# Patient Record
Sex: Male | Born: 1975
Health system: Southern US, Community
[De-identification: ages and names within clinical notes are randomized; demographics above are authoritative.]

## PROBLEM LIST (undated history)

## (undated) DIAGNOSIS — F1910 Other psychoactive substance abuse, uncomplicated: Secondary | ICD-10-CM

## (undated) DIAGNOSIS — N182 Chronic kidney disease, stage 2 (mild): Secondary | ICD-10-CM

## (undated) DIAGNOSIS — I428 Other cardiomyopathies: Secondary | ICD-10-CM

## (undated) DIAGNOSIS — E119 Type 2 diabetes mellitus without complications: Secondary | ICD-10-CM

## (undated) HISTORY — PX: DENTAL SURGERY: SHX609

## (undated) HISTORY — DX: Other cardiomyopathies: I42.8

---

## 1999-05-11 ENCOUNTER — Inpatient Hospital Stay (HOSPITAL_COMMUNITY): Admission: EM | Admit: 1999-05-11 | Discharge: 1999-05-14 | Payer: Self-pay | Admitting: Emergency Medicine

## 1999-05-19 ENCOUNTER — Encounter: Admission: RE | Admit: 1999-05-19 | Discharge: 1999-08-17 | Payer: Self-pay | Admitting: Family Medicine

## 1999-08-22 ENCOUNTER — Encounter: Admission: RE | Admit: 1999-08-22 | Discharge: 1999-11-20 | Payer: Self-pay | Admitting: Family Medicine

## 2000-02-09 ENCOUNTER — Emergency Department (HOSPITAL_COMMUNITY): Admission: EM | Admit: 2000-02-09 | Discharge: 2000-02-09 | Payer: Self-pay | Admitting: Emergency Medicine

## 2000-09-16 ENCOUNTER — Emergency Department (HOSPITAL_COMMUNITY): Admission: EM | Admit: 2000-09-16 | Discharge: 2000-09-16 | Payer: Self-pay | Admitting: Emergency Medicine

## 2001-04-08 ENCOUNTER — Emergency Department (HOSPITAL_COMMUNITY): Admission: EM | Admit: 2001-04-08 | Discharge: 2001-04-08 | Payer: Self-pay | Admitting: Emergency Medicine

## 2003-12-24 ENCOUNTER — Ambulatory Visit: Payer: Self-pay | Admitting: *Deleted

## 2003-12-24 ENCOUNTER — Ambulatory Visit: Payer: Self-pay | Admitting: Internal Medicine

## 2004-06-11 ENCOUNTER — Inpatient Hospital Stay (HOSPITAL_COMMUNITY): Admission: EM | Admit: 2004-06-11 | Discharge: 2004-06-13 | Payer: Self-pay | Admitting: Emergency Medicine

## 2004-06-29 ENCOUNTER — Inpatient Hospital Stay (HOSPITAL_COMMUNITY): Admission: EM | Admit: 2004-06-29 | Discharge: 2004-06-30 | Payer: Self-pay | Admitting: Emergency Medicine

## 2004-07-06 ENCOUNTER — Ambulatory Visit: Payer: Self-pay | Admitting: Internal Medicine

## 2004-11-23 ENCOUNTER — Emergency Department (HOSPITAL_COMMUNITY): Admission: EM | Admit: 2004-11-23 | Discharge: 2004-11-23 | Payer: Self-pay | Admitting: Emergency Medicine

## 2005-05-06 ENCOUNTER — Inpatient Hospital Stay (HOSPITAL_COMMUNITY): Admission: EM | Admit: 2005-05-06 | Discharge: 2005-05-07 | Payer: Self-pay | Admitting: Emergency Medicine

## 2005-08-09 ENCOUNTER — Emergency Department (HOSPITAL_COMMUNITY): Admission: EM | Admit: 2005-08-09 | Discharge: 2005-08-10 | Payer: Self-pay | Admitting: Emergency Medicine

## 2005-08-11 ENCOUNTER — Ambulatory Visit: Payer: Self-pay | Admitting: Internal Medicine

## 2005-09-28 ENCOUNTER — Inpatient Hospital Stay (HOSPITAL_COMMUNITY): Admission: EM | Admit: 2005-09-28 | Discharge: 2005-09-30 | Payer: Self-pay | Admitting: Emergency Medicine

## 2005-09-28 ENCOUNTER — Ambulatory Visit: Payer: Self-pay | Admitting: Internal Medicine

## 2005-10-02 ENCOUNTER — Emergency Department (HOSPITAL_COMMUNITY): Admission: EM | Admit: 2005-10-02 | Discharge: 2005-10-02 | Payer: Self-pay | Admitting: Emergency Medicine

## 2006-08-29 ENCOUNTER — Ambulatory Visit: Payer: Self-pay | Admitting: Internal Medicine

## 2007-01-02 ENCOUNTER — Encounter (INDEPENDENT_AMBULATORY_CARE_PROVIDER_SITE_OTHER): Payer: Self-pay | Admitting: *Deleted

## 2007-03-05 ENCOUNTER — Ambulatory Visit: Payer: Self-pay | Admitting: Internal Medicine

## 2007-11-22 ENCOUNTER — Emergency Department (HOSPITAL_COMMUNITY): Admission: EM | Admit: 2007-11-22 | Discharge: 2007-11-22 | Payer: Self-pay | Admitting: Emergency Medicine

## 2008-01-28 ENCOUNTER — Ambulatory Visit: Payer: Self-pay | Admitting: Internal Medicine

## 2008-07-31 ENCOUNTER — Emergency Department (HOSPITAL_COMMUNITY): Admission: EM | Admit: 2008-07-31 | Discharge: 2008-07-31 | Payer: Self-pay | Admitting: Emergency Medicine

## 2008-09-21 ENCOUNTER — Inpatient Hospital Stay (HOSPITAL_COMMUNITY): Admission: EM | Admit: 2008-09-21 | Discharge: 2008-09-23 | Payer: Self-pay | Admitting: Emergency Medicine

## 2009-06-06 ENCOUNTER — Inpatient Hospital Stay (HOSPITAL_COMMUNITY): Admission: EM | Admit: 2009-06-06 | Discharge: 2009-06-08 | Payer: Self-pay | Admitting: Emergency Medicine

## 2009-06-17 ENCOUNTER — Ambulatory Visit: Payer: Self-pay | Admitting: Family Medicine

## 2009-06-17 ENCOUNTER — Encounter (INDEPENDENT_AMBULATORY_CARE_PROVIDER_SITE_OTHER): Payer: Self-pay | Admitting: Internal Medicine

## 2009-06-17 LAB — CONVERTED CEMR LAB
CO2: 26 meq/L (ref 19–32)
Calcium: 8.7 mg/dL (ref 8.4–10.5)
Chloride: 104 meq/L (ref 96–112)
Glucose, Bld: 134 mg/dL — ABNORMAL HIGH (ref 70–99)
Sodium: 139 meq/L (ref 135–145)

## 2010-06-21 ENCOUNTER — Emergency Department (HOSPITAL_COMMUNITY)
Admission: EM | Admit: 2010-06-21 | Discharge: 2010-06-21 | Disposition: A | Discharge status: Home / Self Care | Payer: Self-pay | Attending: Emergency Medicine | Admitting: Emergency Medicine

## 2010-06-21 DIAGNOSIS — E119 Type 2 diabetes mellitus without complications: Secondary | ICD-10-CM | POA: Insufficient documentation

## 2010-06-21 DIAGNOSIS — E860 Dehydration: Secondary | ICD-10-CM | POA: Insufficient documentation

## 2010-06-21 DIAGNOSIS — Z794 Long term (current) use of insulin: Secondary | ICD-10-CM | POA: Insufficient documentation

## 2010-06-21 LAB — COMPREHENSIVE METABOLIC PANEL
ALT: 20 U/L (ref 0–53)
AST: 21 U/L (ref 0–37)
CO2: 27 mEq/L (ref 19–32)
Calcium: 9.4 mg/dL (ref 8.4–10.5)
Chloride: 93 mEq/L — ABNORMAL LOW (ref 96–112)
Creatinine, Ser: 1.05 mg/dL (ref 0.4–1.5)
GFR calc Af Amer: >60 mL/min (ref >60)
GFR calc non Af Amer: >60 mL/min (ref >60)
Glucose, Bld: 541 mg/dL — ABNORMAL HIGH (ref 70–99)
Total Bilirubin: 0.6 mg/dL (ref 0.3–1.2)

## 2010-06-21 LAB — URINALYSIS, ROUTINE W REFLEX MICROSCOPIC
Bilirubin Urine: NEGATIVE
Hgb urine dipstick: NEGATIVE
Nitrite: NEGATIVE
Protein, ur: NEGATIVE mg/dL
Specific Gravity, Urine: 1.026 (ref 1.005–1.030)
Urobilinogen, UA: 0.2 mg/dL (ref 0.0–1.0)

## 2010-06-21 LAB — CBC
HCT: 44.4 % (ref 39.0–52.0)
Hemoglobin: 15.8 g/dL (ref 13.0–17.0)
MCH: 32.2 pg (ref 26.0–34.0)
MCHC: 35.6 g/dL (ref 30.0–36.0)
RBC: 4.91 MIL/uL (ref 4.22–5.81)

## 2010-06-21 LAB — POCT I-STAT, CHEM 8
BUN: 14 mg/dL (ref 6–23)
Chloride: 97 mEq/L (ref 96–112)
Creatinine, Ser: 1 mg/dL (ref 0.4–1.5)
Potassium: 5.1 mEq/L (ref 3.5–5.1)
Sodium: 131 mEq/L — ABNORMAL LOW (ref 135–145)
TCO2: 25 mmol/L (ref 0–100)

## 2010-06-21 LAB — DIFFERENTIAL
Basophils Relative: 0 % (ref 0–1)
Lymphocytes Relative: 36 % (ref 12–46)
Lymphs Abs: 3.2 10*3/uL (ref 0.7–4.0)
Monocytes Absolute: 0.6 10*3/uL (ref 0.1–1.0)
Monocytes Relative: 6 % (ref 3–12)
Neutro Abs: 5.2 10*3/uL (ref 1.7–7.7)
Neutrophils Relative %: 57 % (ref 43–77)

## 2010-06-21 LAB — GLUCOSE, CAPILLARY
Glucose-Capillary: >600 mg/dL (ref 70–99)
Glucose-Capillary: 149 mg/dL — ABNORMAL HIGH (ref 70–99)

## 2010-06-21 LAB — LIPASE, BLOOD: Lipase: 44 U/L (ref 11–59)

## 2010-07-08 LAB — BASIC METABOLIC PANEL
BUN: 15 mg/dL (ref 6–23)
BUN: 4 mg/dL — ABNORMAL LOW (ref 6–23)
BUN: 6 mg/dL (ref 6–23)
CO2: 18 mEq/L — ABNORMAL LOW (ref 19–32)
CO2: 5 mEq/L — CL (ref 19–32)
Calcium: 7.3 mg/dL — ABNORMAL LOW (ref 8.4–10.5)
Calcium: 7.9 mg/dL — ABNORMAL LOW (ref 8.4–10.5)
Calcium: 8.2 mg/dL — ABNORMAL LOW (ref 8.4–10.5)
Calcium: 9.2 mg/dL (ref 8.4–10.5)
Chloride: 113 mEq/L — ABNORMAL HIGH (ref 96–112)
Chloride: 113 mEq/L — ABNORMAL HIGH (ref 96–112)
Chloride: 93 mEq/L — ABNORMAL LOW (ref 96–112)
Creatinine, Ser: 0.78 mg/dL (ref 0.4–1.5)
Creatinine, Ser: 1.23 mg/dL (ref 0.4–1.5)
Creatinine, Ser: 1.71 mg/dL — ABNORMAL HIGH (ref 0.4–1.5)
Creatinine, Ser: 1.81 mg/dL — ABNORMAL HIGH (ref 0.4–1.5)
Creatinine, Ser: 1.86 mg/dL — ABNORMAL HIGH (ref 0.4–1.5)
GFR calc Af Amer: 50 mL/min — ABNORMAL LOW (ref >60)
GFR calc Af Amer: 52 mL/min — ABNORMAL LOW (ref >60)
GFR calc Af Amer: >60 mL/min (ref >60)
GFR calc Af Amer: >60 mL/min (ref >60)
GFR calc Af Amer: >60 mL/min (ref >60)
GFR calc non Af Amer: 43 mL/min — ABNORMAL LOW (ref >60)
GFR calc non Af Amer: 51 mL/min — ABNORMAL LOW (ref >60)
GFR calc non Af Amer: >60 mL/min (ref >60)
GFR calc non Af Amer: >60 mL/min (ref >60)
GFR calc non Af Amer: >60 mL/min (ref >60)
Glucose, Bld: 110 mg/dL — ABNORMAL HIGH (ref 70–99)
Glucose, Bld: 151 mg/dL — ABNORMAL HIGH (ref 70–99)
Glucose, Bld: 177 mg/dL — ABNORMAL HIGH (ref 70–99)
Potassium: 3.5 mEq/L (ref 3.5–5.1)
Potassium: 3.7 mEq/L (ref 3.5–5.1)
Potassium: 3.9 mEq/L (ref 3.5–5.1)
Sodium: 135 mEq/L (ref 135–145)
Sodium: 137 mEq/L (ref 135–145)
Sodium: 137 mEq/L (ref 135–145)
Sodium: 138 mEq/L (ref 135–145)
Sodium: 138 mEq/L (ref 135–145)

## 2010-07-08 LAB — CBC
Hemoglobin: 12.6 g/dL — ABNORMAL LOW (ref 13.0–17.0)
MCV: 94.1 fL (ref 78.0–100.0)
MCV: 94.6 fL (ref 78.0–100.0)
Platelets: 336 10*3/uL (ref 150–400)
Platelets: 363 10*3/uL (ref 150–400)
RBC: 3.8 MIL/uL — ABNORMAL LOW (ref 4.22–5.81)
RBC: 3.82 MIL/uL — ABNORMAL LOW (ref 4.22–5.81)
WBC: 15.4 10*3/uL — ABNORMAL HIGH (ref 4.0–10.5)
WBC: 17.3 10*3/uL — ABNORMAL HIGH (ref 4.0–10.5)
WBC: 8.3 10*3/uL (ref 4.0–10.5)

## 2010-07-08 LAB — GLUCOSE, CAPILLARY
Glucose-Capillary: 104 mg/dL — ABNORMAL HIGH (ref 70–99)
Glucose-Capillary: 137 mg/dL — ABNORMAL HIGH (ref 70–99)
Glucose-Capillary: 138 mg/dL — ABNORMAL HIGH (ref 70–99)
Glucose-Capillary: 145 mg/dL — ABNORMAL HIGH (ref 70–99)
Glucose-Capillary: 154 mg/dL — ABNORMAL HIGH (ref 70–99)
Glucose-Capillary: 163 mg/dL — ABNORMAL HIGH (ref 70–99)
Glucose-Capillary: 197 mg/dL — ABNORMAL HIGH (ref 70–99)
Glucose-Capillary: 208 mg/dL — ABNORMAL HIGH (ref 70–99)
Glucose-Capillary: 283 mg/dL — ABNORMAL HIGH (ref 70–99)
Glucose-Capillary: 317 mg/dL — ABNORMAL HIGH (ref 70–99)
Glucose-Capillary: 333 mg/dL — ABNORMAL HIGH (ref 70–99)
Glucose-Capillary: 378 mg/dL — ABNORMAL HIGH (ref 70–99)
Glucose-Capillary: 462 mg/dL — ABNORMAL HIGH (ref 70–99)
Glucose-Capillary: 83 mg/dL (ref 70–99)
Glucose-Capillary: 97 mg/dL (ref 70–99)

## 2010-07-08 LAB — CK TOTAL AND CKMB (NOT AT ARMC)
CK, MB: 1.8 ng/mL (ref 0.3–4.0)
Total CK: 132 U/L (ref 7–232)

## 2010-07-08 LAB — DIFFERENTIAL
Basophils Absolute: 0 10*3/uL (ref 0.0–0.1)
Basophils Relative: 0 % (ref 0–1)
Basophils Relative: 1 % (ref 0–1)
Eosinophils Absolute: 0 10*3/uL (ref 0.0–0.7)
Eosinophils Absolute: 0 10*3/uL (ref 0.0–0.7)
Eosinophils Relative: 0 % (ref 0–5)
Lymphocytes Relative: 16 % (ref 12–46)
Lymphocytes Relative: 29 % (ref 12–46)
Lymphs Abs: 2.2 10*3/uL (ref 0.7–4.0)
Lymphs Abs: 2.4 10*3/uL (ref 0.7–4.0)
Monocytes Absolute: 0.9 10*3/uL (ref 0.1–1.0)
Monocytes Relative: 12 % (ref 3–12)
Neutro Abs: 11.9 10*3/uL — ABNORMAL HIGH (ref 1.7–7.7)
Neutro Abs: 4.9 10*3/uL (ref 1.7–7.7)
Neutrophils Relative %: 59 % (ref 43–77)
Neutrophils Relative %: 77 % (ref 43–77)

## 2010-07-08 LAB — CARDIAC PANEL(CRET KIN+CKTOT+MB+TROPI)
CK, MB: 1.7 ng/mL (ref 0.3–4.0)
CK, MB: 1.8 ng/mL (ref 0.3–4.0)
Relative Index: 1.3 (ref 0.0–2.5)
Relative Index: 1.4 (ref 0.0–2.5)
Total CK: 130 U/L (ref 7–232)
Troponin I: 0.02 ng/mL (ref 0.00–0.06)

## 2010-07-08 LAB — COMPREHENSIVE METABOLIC PANEL
ALT: 10 U/L (ref 0–53)
AST: 13 U/L (ref 0–37)
Albumin: 2.6 g/dL — ABNORMAL LOW (ref 3.5–5.2)
Alkaline Phosphatase: 81 U/L (ref 39–117)
CO2: 19 mEq/L (ref 19–32)
Chloride: 111 mEq/L (ref 96–112)
Creatinine, Ser: 1.17 mg/dL (ref 0.4–1.5)
GFR calc Af Amer: >60 mL/min (ref >60)
GFR calc non Af Amer: >60 mL/min (ref >60)
Potassium: 3.5 mEq/L (ref 3.5–5.1)
Sodium: 134 mEq/L — ABNORMAL LOW (ref 135–145)
Total Bilirubin: 0.5 mg/dL (ref 0.3–1.2)

## 2010-07-08 LAB — CULTURE, BLOOD (ROUTINE X 2)

## 2010-07-08 LAB — URINALYSIS, ROUTINE W REFLEX MICROSCOPIC
Glucose, UA: NEGATIVE mg/dL
Nitrite: NEGATIVE
Specific Gravity, Urine: 1.016 (ref 1.005–1.030)
pH: 5.5 (ref 5.0–8.0)

## 2010-07-08 LAB — BLOOD GAS, ARTERIAL
Drawn by: 308601
FIO2: 0.21 %
pCO2 arterial: 16.7 mmHg — CL (ref 35.0–45.0)
pH, Arterial: 7.156 — CL (ref 7.350–7.450)

## 2010-07-08 LAB — URINE CULTURE
Colony Count: NO GROWTH
Culture: NO GROWTH

## 2010-07-08 LAB — KETONES, QUALITATIVE

## 2010-07-08 LAB — URINE MICROSCOPIC-ADD ON: Urine-Other: NONE SEEN

## 2010-07-08 LAB — RAPID URINE DRUG SCREEN, HOSP PERFORMED
Barbiturates: NOT DETECTED
Opiates: NOT DETECTED

## 2010-07-25 LAB — BASIC METABOLIC PANEL
BUN: 11 mg/dL (ref 6–23)
BUN: 4 mg/dL — ABNORMAL LOW (ref 6–23)
CO2: 15 mEq/L — ABNORMAL LOW (ref 19–32)
CO2: 20 mEq/L (ref 19–32)
CO2: 22 mEq/L (ref 19–32)
Calcium: 7.7 mg/dL — ABNORMAL LOW (ref 8.4–10.5)
Calcium: 7.9 mg/dL — ABNORMAL LOW (ref 8.4–10.5)
Calcium: 8.1 mg/dL — ABNORMAL LOW (ref 8.4–10.5)
Chloride: 106 mEq/L (ref 96–112)
Chloride: 96 mEq/L (ref 96–112)
Creatinine, Ser: 0.97 mg/dL (ref 0.4–1.5)
Creatinine, Ser: 1.99 mg/dL — ABNORMAL HIGH (ref 0.4–1.5)
GFR calc Af Amer: 47 mL/min — ABNORMAL LOW (ref >60)
GFR calc non Af Amer: 48 mL/min — ABNORMAL LOW (ref >60)
GFR calc non Af Amer: >60 mL/min (ref >60)
Glucose, Bld: 140 mg/dL — ABNORMAL HIGH (ref 70–99)
Glucose, Bld: 177 mg/dL — ABNORMAL HIGH (ref 70–99)
Glucose, Bld: 280 mg/dL — ABNORMAL HIGH (ref 70–99)
Glucose, Bld: 328 mg/dL — ABNORMAL HIGH (ref 70–99)
Potassium: 3.8 mEq/L (ref 3.5–5.1)
Potassium: 4 mEq/L (ref 3.5–5.1)
Potassium: 4.4 mEq/L (ref 3.5–5.1)
Potassium: 4.8 mEq/L (ref 3.5–5.1)
Sodium: 136 mEq/L (ref 135–145)
Sodium: 139 mEq/L (ref 135–145)
Sodium: 140 mEq/L (ref 135–145)
Sodium: 141 mEq/L (ref 135–145)

## 2010-07-25 LAB — CARDIAC PANEL(CRET KIN+CKTOT+MB+TROPI)
Relative Index: 1.5 (ref 0.0–2.5)
Troponin I: 0.03 ng/mL (ref 0.00–0.06)

## 2010-07-25 LAB — GLUCOSE, CAPILLARY
Glucose-Capillary: 114 mg/dL — ABNORMAL HIGH (ref 70–99)
Glucose-Capillary: 131 mg/dL — ABNORMAL HIGH (ref 70–99)
Glucose-Capillary: 136 mg/dL — ABNORMAL HIGH (ref 70–99)
Glucose-Capillary: 163 mg/dL — ABNORMAL HIGH (ref 70–99)
Glucose-Capillary: 166 mg/dL — ABNORMAL HIGH (ref 70–99)
Glucose-Capillary: 172 mg/dL — ABNORMAL HIGH (ref 70–99)
Glucose-Capillary: 180 mg/dL — ABNORMAL HIGH (ref 70–99)
Glucose-Capillary: 210 mg/dL — ABNORMAL HIGH (ref 70–99)
Glucose-Capillary: 365 mg/dL — ABNORMAL HIGH (ref 70–99)
Glucose-Capillary: 54 mg/dL — ABNORMAL LOW (ref 70–99)

## 2010-07-25 LAB — DIFFERENTIAL
Basophils Absolute: 0 10*3/uL (ref 0.0–0.1)
Basophils Absolute: 0.1 10*3/uL (ref 0.0–0.1)
Eosinophils Absolute: 0 10*3/uL (ref 0.0–0.7)
Eosinophils Absolute: 0 10*3/uL (ref 0.0–0.7)
Eosinophils Relative: 0 % (ref 0–5)
Lymphocytes Relative: 14 % (ref 12–46)
Lymphocytes Relative: 40 % (ref 12–46)
Lymphs Abs: 2.5 10*3/uL (ref 0.7–4.0)
Lymphs Abs: 3.9 10*3/uL (ref 0.7–4.0)
Monocytes Absolute: 1.7 10*3/uL — ABNORMAL HIGH (ref 0.1–1.0)
Monocytes Relative: 5 % (ref 3–12)
Neutrophils Relative %: 53 % (ref 43–77)
Neutrophils Relative %: 79 % — ABNORMAL HIGH (ref 43–77)

## 2010-07-25 LAB — URINALYSIS, ROUTINE W REFLEX MICROSCOPIC
Bilirubin Urine: NEGATIVE
Glucose, UA: >1000 mg/dL — AB
Hgb urine dipstick: NEGATIVE
Ketones, ur: >80 mg/dL — AB
Protein, ur: NEGATIVE mg/dL

## 2010-07-25 LAB — HEPATIC FUNCTION PANEL
AST: 27 U/L (ref 0–37)
Albumin: 4.3 g/dL (ref 3.5–5.2)
Alkaline Phosphatase: 115 U/L (ref 39–117)
Total Bilirubin: 2 mg/dL — ABNORMAL HIGH (ref 0.3–1.2)

## 2010-07-25 LAB — CBC
HCT: 35.9 % — ABNORMAL LOW (ref 39.0–52.0)
Hemoglobin: 12.9 g/dL — ABNORMAL LOW (ref 13.0–17.0)
MCV: 94.2 fL (ref 78.0–100.0)
MCV: 95.7 fL (ref 78.0–100.0)
Platelets: 208 10*3/uL (ref 150–400)
Platelets: 230 10*3/uL (ref 150–400)
RBC: 4.9 MIL/uL (ref 4.22–5.81)
RDW: 13.2 % (ref 11.5–15.5)
RDW: 13.5 % (ref 11.5–15.5)
WBC: 16.3 10*3/uL — ABNORMAL HIGH (ref 4.0–10.5)
WBC: 9.9 10*3/uL (ref 4.0–10.5)

## 2010-07-25 LAB — CALCIUM: Calcium: 7.9 mg/dL — ABNORMAL LOW (ref 8.4–10.5)

## 2010-07-25 LAB — CK TOTAL AND CKMB (NOT AT ARMC)
Relative Index: 1 (ref 0.0–2.5)
Total CK: 139 U/L (ref 7–232)

## 2010-07-25 LAB — TROPONIN I: Troponin I: 0.02 ng/mL (ref 0.00–0.06)

## 2010-07-27 LAB — CBC
Hemoglobin: 15.7 g/dL (ref 13.0–17.0)
MCHC: 34.7 g/dL (ref 30.0–36.0)
RBC: 4.76 MIL/uL (ref 4.22–5.81)
WBC: 9.8 10*3/uL (ref 4.0–10.5)

## 2010-07-27 LAB — URINALYSIS, ROUTINE W REFLEX MICROSCOPIC
Bilirubin Urine: NEGATIVE
Glucose, UA: >1000 mg/dL — AB
Hgb urine dipstick: NEGATIVE
Specific Gravity, Urine: 1.03 (ref 1.005–1.030)
Urobilinogen, UA: 0.2 mg/dL (ref 0.0–1.0)
pH: 5.5 (ref 5.0–8.0)

## 2010-07-27 LAB — BASIC METABOLIC PANEL
CO2: 16 mEq/L — ABNORMAL LOW (ref 19–32)
Calcium: 9.4 mg/dL (ref 8.4–10.5)
Creatinine, Ser: 1.46 mg/dL (ref 0.4–1.5)
GFR calc Af Amer: >60 mL/min (ref >60)
Sodium: 133 mEq/L — ABNORMAL LOW (ref 135–145)

## 2010-07-27 LAB — DIFFERENTIAL
Basophils Relative: 0 % (ref 0–1)
Lymphocytes Relative: 23 % (ref 12–46)
Lymphs Abs: 2.2 10*3/uL (ref 0.7–4.0)
Monocytes Absolute: 0.6 10*3/uL (ref 0.1–1.0)
Monocytes Relative: 6 % (ref 3–12)
Neutro Abs: 6.8 10*3/uL (ref 1.7–7.7)
Neutrophils Relative %: 70 % (ref 43–77)

## 2010-07-27 LAB — GLUCOSE, CAPILLARY
Glucose-Capillary: 31 mg/dL — CL (ref 70–99)
Glucose-Capillary: 312 mg/dL — ABNORMAL HIGH (ref 70–99)
Glucose-Capillary: 340 mg/dL — ABNORMAL HIGH (ref 70–99)
Glucose-Capillary: 358 mg/dL — ABNORMAL HIGH (ref 70–99)
Glucose-Capillary: 445 mg/dL — ABNORMAL HIGH (ref 70–99)

## 2010-07-27 LAB — URINE MICROSCOPIC-ADD ON: Urine-Other: NONE SEEN

## 2010-08-30 NOTE — Discharge Summary (Signed)
[name], [name]             ACCOUNT NO.:  [id number]   MEDICAL RECORD NO.:  [id number]          PATIENT TYPE:  INP   LOCATION:  1319                         FACILITY:  [facility]   PHYSICIAN:  Peggye Pitt, M.D. DATE OF BIRTH:  [birth year]   DATE OF ADMISSION:  09/21/2008  DATE OF DISCHARGE:  09/23/2008                               DISCHARGE SUMMARY   DISCHARGE DIAGNOSES:  1. Nausea, vomiting, resolved.  2. Diabetic ketoacidosis, resolved.  3. Type 1 diabetes mellitus.  4. Acute renal insufficiency, resolved.   DISCHARGE MEDICATIONS:  1. Lantus 30 units subcutaneously daily.  2. NovoLog sliding scale insulin as follows.  His blood sugar between      121 and 150 to inject 1 unit, if between 151 and 200 two units, if      between 201 and 250 three units, if between 251 and 300 five units,      if between 301 and 350 seven units, and if greater or equal to 351      inject 9 units.   DISPOSITION AND FOLLOW-UP:  [name] is discharged home today in  stable condition.  I have asked the case manager to assist me with both  his Lantus as well as follow-up with [facility].  He is instructed to  keep a log book, and to bring that in at time of his hospital follow-up  appointment.   CONSULTATIONS THIS HOSPITALIZATION:  None.   IMAGES AND PROCEDURES:  A chest x-ray on September 21, 2008 that showed no  acute findings.   History and physical exam, for full details please see dictation by Dr.  Lovell Sheehan on September 22, 2008, but in brief [name] is a pleasant 35-year-  old African American man with a history of type 1 diabetes mellitus who  came in with unrelenting nausea and vomiting for the past week, unable  to hold down any food or liquids.  Also reports that blood sugars have  been going up.  He has been out of his medications, as he has not been  able to afford them.  He was found to have a blood sugar of 618 and a  bicarb of 16 upon admission, and hence we were called to admit him  for  further evaluation and management.   HOSPITAL COURSE BY ACTIVE PROBLEM:  1. Diabetic ketoacidosis.  Initially admitted to the step-down unit,      placed on IV insulin for a Glucommander protocol, aggressive IV      fluid resuscitation.  He has now been transitioned over to      subcutaneous insulin, and is doing well with CBGs in the 130s to      160s on Lantus and sliding-scale insulin.  2. Acute renal insufficiency presumed secondary to dehydration      secondary to his diabetic ketoacidosis.  His creatinine upon      admission was 1.99.  It has now normalized to 0.97 on day of      discharge.   VITAL SIGNS ON DAY OF DISCHARGE:  Blood pressure 116/82, heart rate 87,  respirations 20, O2 saturations  100% on room air with a temp 98.1.   LABORATORIES:  On day of discharge sodium 38, potassium 3.6, chloride  107, bicarb 26, BUN 4, creatinine 0.97, glucose of 177.  WBC 9.9,  hemoglobin 12.7, and a platelet count of 208.      Peggye Pitt, M.D.  Electronically Signed     EH/MEDQ  D:  09/23/2008  T:  09/23/2008  Job:  [id number]

## 2010-08-30 NOTE — H&P (Signed)
NAMEKEM, HENSEN NO.:  [id number]   MEDICAL RECORD NO.:  [id number]          PATIENT TYPE:  INP   LOCATION:  1232                         FACILITY:  [facility]   PHYSICIAN:  Della Goo, M.D. DATE OF BIRTH:  [birth year]   DATE OF ADMISSION:  09/21/2008  DATE OF DISCHARGE:                              HISTORY & PHYSICAL   PRIMARY CARE PHYSICIAN:  Unassigned.   CHIEF COMPLAINT:  Nausea, vomiting.   HISTORY OF PRESENT ILLNESS:  This is a 35 year old male who presents to  the emergency department with complaints of severe nausea and vomiting  over the past week.  He reports not being able to hold down any foods or  liquids.  He also reports that his blood sugars have been going up.  He  states that he has been out of his medications for the past week as  well.  He denies having any cough, fevers, chills.  Denies having any  diarrhea.  When the patient arrived in the emergency department, his  blood sugar was checked and he was found to have a glucose of 618.  The  patient was referred for admission.   PAST MEDICAL HISTORY:  Type 1 diabetes mellitus.   PAST SURGICAL HISTORY:  None.   MEDICATIONS:  The patient previously had been on Lantus 30 units subcu  q.h.s. with sliding scale insulin coverage.   ALLERGIES:  SHELLFISH.   SOCIAL HISTORY:  The patient is a smoker.  He smokes 5-6 cigarettes  daily.  He is a nondrinker.  He denies any illicit drug usage.   FAMILY HISTORY:  Noncontributory.   REVIEW OF SYSTEMS:  Pertinents are mentioned above.   PHYSICAL EXAMINATION FINDINGS:  GENERAL:  This is a thin, well-  developed, 35 year old male in mild discomfort and no acute distress.  VITAL SIGNS:  Temperature 98.2, blood pressure 104/55, heart rate 120,  respirations 20, O2 sats 100%.  HEENT:  Normocephalic, atraumatic.  Pupils equally round, reactive to  light.  Extraocular movements are intact.  Funduscopic benign.  Nares  are patent bilaterally.   Oropharynx is clear.  NECK:  Supple.  Full range of motion.  No thyromegaly, adenopathy,  jugular venous distention.  CARDIOVASCULAR:  Tachycardiac rate and rhythm.  No murmurs, gallops or  rubs.  LUNGS:  Clear to auscultation bilaterally.  ABDOMEN:  Positive bowel sounds, soft, nontender, nondistended.  EXTREMITIES:  Without cyanosis, clubbing or edema.  NEUROLOGIC:  The patient is alert and oriented x3.  There are no focal  deficits.   LABORATORY STUDIES:  White blood cell count 16.3, hemoglobin 16.4,  hematocrit 46.9, platelets 275, neutrophils 79%, lymphocytes 15%,  albumin 4.3, AST 27, ALT 27, alkaline phosphatase 115, total bilirubin  2.0.  Sodium 139, potassium 4.8, chloride 96, carbon dioxide 16, BUN 19,  creatinine 1.99 and glucose 618.  Urinalysis reveals greater than 1000  glucose.  Urine ketones 80.  Chest x-ray reveals no acute disease  process.   ASSESSMENT:  A 35 year old male being admitted with:  1. Diabetic ketoacidosis.  2. Nausea and vomiting.  3. Mild dehydration.  4. Tobacco abuse.  PLAN:  The patient will be admitted to the step-down ICU area.  He will  be placed on the IV insulin protocol and IV fluids have been ordered.  His electrolytes will be monitored and replaced as needed..  The patient  will be placed on DVT and GI prophylaxis.  Antiemetic therapy has also  been ordered as needed.  The patient will be transitioned to sliding  scale insulin coverage and then resume his Lantus insulin.  Further  workup will ensue pending results of the patient's clinical, course.      Della Goo, M.D.  Electronically Signed     HJ/MEDQ  D:  09/22/2008  T:  09/22/2008  Job:  [id number]

## 2010-09-02 NOTE — H&P (Signed)
[name], [name] NO.:  [id number]   MEDICAL RECORD NO.:  [id number]          PATIENT TYPE:  EMS   LOCATION:  MAJO                         FACILITY:  [facility]   PHYSICIAN:  Renato Battles, M.D.     DATE OF BIRTH:  March 13, 1976   DATE OF ADMISSION:  06/10/2004  DATE OF DISCHARGE:                                HISTORY & PHYSICAL   REASON FOR ADMISSION:  Feeling bad.   PRIMARY CARE PHYSICIAN:  [facility].   HISTORY OF PRESENT ILLNESS:  The patient is a 35 year old African American  male who was feeling bad for the last couple of days. He admitted being  short on insulin and not taking it as regularly as he is supposed to. He  also told me that he was exposed to his children who have the flu. He felt  progressively weak and tired and decided to come to the emergency room where  initial workup showed that he developed DKA.   REVIEW OF SYSTEMS:  CONSTITUTIONAL SYMPTOMS: No fever, chills, or  nightsweats. CARDIOPULMONARY: Positive for cough. No shortness of breath. No  chest pain. Also positive for right-sided chest pain that is worse with  swallowing, coughing, and with pressure. GI: Positive for nausea but no  vomiting or diarrhea. GU: No dysuria, hematuria, or retention.   PAST MEDICAL HISTORY:  1.  Type 1 diabetes since 2001.  2.  Questionable history of hypertension.   PAST SURGICAL HISTORY:  None.   FAMILY HISTORY:  Positive for diabetes.   SOCIAL HISTORY:  He smoked one pack a day for the last 13 years. He denies  alcohol. He uses marijuana with very recent use and admits to occasional  cocaine use.   ALLERGIES:  Shellfish.   HOME MEDICATIONS:  1.  Insulin 70/30 subcutaneously, 30 units q.a.m. and 30 units q.p.m.  2.  Heart pill. The patient does not know what it does, and thinks it is      probably related to his kidney and blood pressure. Sounds like an ACE      inhibitor.   PHYSICAL EXAMINATION:  GENERAL: The patient is alert and oriented  times  three. He is in moderate distress.  VITAL SIGNS: Temperature 98.8, heart rate 104, respiratory rate 16, blood  pressure 113/68.  HEENT: Head is normocephalic and atraumatic. Pupils equal, round, and  reactive to light and accommodation. Extraocular movements intact  bilaterally. Mouth exam is grossly negative for abscesses.  NECK:  No lymphadenopathy, thyromegaly, or JVD.  CHEST: Clear to auscultation bilaterally. No rales, rhonchi, or wheezes. The  patient has reproducible tenderness in the right anterior chest wall.  HEART: Regular rhythm, tachycardia, no murmurs.  ABDOMEN: Soft, nontender, nondistended. Normoactive bowel sounds.  EXTREMITIES: No clubbing, cyanosis, or edema.   STUDIES:  CBC shows white count of 18.4, hemoglobin 15.6, platelet count  242,000. Electrolytes showed sodium of 132, potassium 4.9, bicarbonate 9.8,  anion gap 18, and normal renal function. Glucose 484.   EKG was normal. Chest x-ray was normal.   UA showed high glucose and ketones. Acetone level was large.   ASSESSMENT:  1.  Diabetic ketoacidosis.  2.  Dehydration.  3.  Costochondritis.   PLAN:  1.  Start IV fluids with half-normal saline, plus 20 Kay Ciel at 200      cc/hour, while blood sugar is above 200. Once the blood sugar is below      200, then switch to D-5 half normal saline with Joyce Gross Ciel.  2.  Start insulin drip per glucomander protocol.  3.  Monitor anion gap and bicarbonate level, and sequential BMPs.  4.  Monitor potassium level on sequential BMPs.  5.  Repeat CBC in the morning.  6.  Check magnesium and phosphorus levels.  7.  Use Toradol for costochondritis pain.      SA/MEDQ  D:  06/11/2004  T:  06/11/2004  Job:  [id number]

## 2010-09-02 NOTE — Discharge Summary (Signed)
[name], [name] NO.:  [id number]   MEDICAL RECORD NO.:  [id number]          PATIENT TYPE:  INP   LOCATION:  5733                         FACILITY:  [facility]   PHYSICIAN:  [name], M.D.    DATE OF BIRTH:  1975/07/15   DATE OF ADMISSION:  06/10/2004  DATE OF DISCHARGE:  06/13/2004                                 DISCHARGE SUMMARY   DISCHARGE DIAGNOSES:  1.  Diabetic ketoacidosis.  2.  Type 1 diabetes mellitus diagnosed in 2001.  3.  Upper respiratory infection.  4.  Polysubstance abuse.  5.  Chest pain.  6.  Mildly elevated bilirubin.  7.  Mild hypokalemia.   DISCHARGE MEDICATIONS:  1.  Insulin 70/30, 30 units twice daily.  2.  Pepcid 10 mg, take 2 pills once daily.  3.  Motrin or Tylenol; take as directed for pain.  4.  Mylanta 15 mL to 30 mL q.4 hours p.r.n. or as directed on the label for      heartburn or reflux.  5.  Claritin 10 mg daily as needed.  6.  Robitussin DM 2 t every four hours as needed.   DISCHARGE DISPOSITION:  The patient was discharged to home in improved and  stable condition on June 13, 2004.  He will follow up with his physician  at the [facility] one to two days to acquire hospital follow up,  and also insulin and syringes.   HISTORY OF THE PRESENT ILLNESS:  The patient is a 35 year old man with a  past medical history significant for type 1 diabetes who presented to the  emergency department on June 10, 2004 with a chief complaint of feeling  bad.  He complained of chest pain, shortness of breath and flu like  symptoms.  He also became progressively weak and tired on the few days  leading up to hospital admission.  The patient admits to not taking is  insulin for two days prior to admission because he ran out and did not have  any money to purchase more insulin.  The patient also admits to smoking  marijuana and cocaine over the past two to three days, celebrating his  birthday.  When the patient was  evaluated in the emergency department it was  noted that his bicarbonate was low at 9.8.  The patient is admitted for  further evaluation and management of DKA.   HOSPITAL COURSE:  1.  DIABETIC KETOACIDOSIS:  On admission the patient was afebrile with a      temperature of 98.8, mildly tachycardic with a heart rate of 104,      respiratory rate 16 and blood pressure 113/68.  His chest x-ray revealed      no active disease.  His heart and mediastinal structures were all within      normal limits.  His lungs were completely clear.  His white blood cell      count was elevated at 18.4 and his bicarbonate was 9.8 with an anion gap      of 18. His glucose was 484.  The patient was bolused with regular  insulin and normal saline.  He was subsequently started on an insulin      drip via the glucomander protocol.  His capillary blood sugars were      assessed on an hourly basis per the glucomander protocol.  His      bicarbonate level was assessed with a BMET every four hours times 24      hours.  An urine drug screen was ordered and revealed cocaine, opiates      and THC.   The patient was maintained on aggressive IV fluids for the first two days of  hospitalization.  He was started on half normal saline at 200 mL an hour;  however, the IV fluids were changed the following day to normal saline with  potassium chloride added at 150 mL an hour.  Within 24 hours of hospital  admission his bicarbonate level improved to 19.  Once his DKA resolved the  glucomander was discontinued and the patient was started on NPH insulin and  a sliding scale insulin regimen every four hours.  He did have a couple  asymptomatic low blood sugars of 53 and 58.  Over the past 24 hours his  capillary blood sugars have ranged from 104-220.  His NPH was titrated to 30  units twice daily.  The patient's sliding scale insulin regimen was  decreased to q.a.c. and q.h.s.  The patient's chemistry panel currently  reveals  a sodium of 137, potassium of 3.5, chloride of 103, CO2 of 30,  glucose 145, BUN 5, creatinine 0.8, and calcium of 8.5.  He was repleted  with potassium chloride by mouth and in the IV fluids.  The patient is  currently stable and ready for hospital discharge.  He was given a sample  bottle of 70/30 insulin from the hospital.  He was also given several  syringes and alcohol swabs.  The patient was advised to return to his home  dose of 70/30 insulin 30 units b.i.d.  He was advised to follow up with  [facility] tomorrow for hospital follow up; and, also to acquire and  obtain insulin and syringes, etc.   1.  CHEST PAIN:  The patient complained of chest pain mostly on the right      side and in the central chest  during the hospital course. An urine drug      screen did reveal cocaine, marijuana and opiates.  The patient admitted      to partying over the few days prior to hospital admission, celebrating      his birthday.  For further evaluation a chest x-ray was ordered on      admission followed by another chest x-ray two days later.  The chest x-      rays remained within normal limits.  An EKG was completely normal with      no abnormalities seen.  The patient was mildly tender over the chest      wall; however, there was no edema or erythema seen.   The patient did have a cough throughout the hospital course, which was  nonproductive.  He was treated with Tessalon Perles and Tussionex cough  syrup.  His lungs were completely clear on exam.  His cardiac exam was  completely negative.  The patient, however, was started empirically on  Protonix 40 mg daily and Mylanta as needed.  Towards the end of the hospital  course the chest pain had almost resolved.  There was no indication that his  chest pain was cardiac in origin.  The possible etiologies for the patient's  chest pain include reflux, chest wall pain from coughing and perhaps a possible pneumonitis from inhalation of cocaine and  marijuana.  The patient  was advised to continue over-the-counter treatment with Mylanta as needed,  Pepcid as needed, Motrin or Tylenol as needed for pain, Claritin as needed  for cough and nasal congestion, and Robitussin DM as needed for cough.  The  patient was strongly advised to discontinue use of illicit drugs.   The patient's serum acetone level on admission was quite elevated.  As  stated previously his DKA completely resolved during the hospital course.      DF/MEDQ  D:  06/13/2004  T:  06/14/2004  Job:  [id number]

## 2010-09-02 NOTE — H&P (Signed)
[name], LUDVIGSEN NO.:  [id number]   MEDICAL RECORD NO.:  [id number]          PATIENT TYPE:  EMS   LOCATION:  ED                           [facility]ACILITY[facility]   PHYSICIAN:  Michaelyn Barter, M.D. DATE OF BIRTH:  31-Jan-1976   DATE OF ADMISSION:  06/29/2004  DATE OF DISCHARGE:                                HISTORY & PHYSICAL   PRIMARY CARE PHYSICIAN:  Unassigned.   CHIEF COMPLAINT:  Dizziness, emesis.   HISTORY OF PRESENT ILLNESS:  Mr. Man is a 35 year old male with a past  medical history of type 1 diabetes mellitus.  He was recently treated here  at [facility] from February 24 to June 13, 2004 for diabetic  ketoacidosis.  He states that he has not been compliant with his medications  and has not taken any medications since Sunday of this week.  The last time  he checked his sugars was over 1 week ago.  He also has not been eating  regularly over the past couple of days.  He developed emesis today, having  two episodes.  He has also developed polyuria since yesterday.  He has a  history of acid reflux and complains of current chest pain that is  reminiscent of his reflux.  He has been dizzy and complains of being  thirsty.  He has tried to satisfy his thirst with liquids, including beer.  He states that he felt as though he was going to pass out earlier today.  He  has also had a cough, which has been nonproductive, and he experiences  fluctuations in body temperature whereby he feels cold at times and hot at  other times.  He went on to complain of being constipated and has not had a  bowel movement since this past Saturday or Sunday.   PAST MEDICAL HISTORY:  1.  Type 1 diabetes mellitus.  2.  Questionable hypertension.   PAST SURGICAL HISTORY:  None.   FAMILY HISTORY:  Mother has no illnesses.  Father with diabetes.   SOCIAL HISTORY:  Cigarettes:  Occasionally.  Alcohol:  Occasionally.  Powder:  Cocaine positive.  Last use was this  past Saturday.  Crack cocaine  is positive.  Last used a couple of weeks ago.   HOME MEDICATIONS:  The patient has not been compliant with any of his home  medications.  His last discharge notice from June 13, 2004 indicates  that the patient is supposed to take the following medications.   1.  Insulin 70/30, 30 units b.i.d.  2.  Pepcid 10 mg 2 tablets once a day.  3.  Motrin or Tylenol p.r.n. for pain.  4.  Mylanta 15 mL to 30 mL q.4h. p.r.n. for heartburn or reflux.  5.  Claritin 10 mg p.o. daily p.r.n.  6.  Robitussin DM 2 tablets q.4h. p.r.n.   REVIEW OF SYSTEMS:  As per HPI.  Otherwise, all other systems are negative.   PHYSICAL EXAMINATION:  GENERAL:  The patient looks weak.  He is cooperative  and awake.  He complains of some chest discomfort.  VITAL SIGNS:  Temperature  97.2, blood pressure 105/63, heart rate 93,  respirations 22, O2 99% on room air.  HEENT:  Normocephalic.  Extraocular movements are intact.  Anicteric.  Pupils react equally to light.  Oral mucosa is pink, somewhat dry.  No  thrush present.  NECK:  Supple.  Thyroid not palpable.  No lymphadenopathy.  CHEST:  There is reproducible tenderness to palpation across the mid sternal  region.  CARDIAC:  S1 and S2 are present.  The patient is tachycardic.  There is no  S3, no S4.  No gallops, no murmurs, no rubs.  RESPIRATORY:  Lungs are clear bilaterally.  No crackles, no wheezes.  ABDOMEN:  Soft.  There is some mild discomfort diffusely across the abdomen.  There is no rebound, no guarding, no organomegaly.  Bowel sounds are  present.  EXTREMITIES:  There is no leg edema.  NEUROLOGICAL:  The patient is alert and oriented x3.  MUSCULOSKELETAL:  There is 5/5 upper and lower extremity strength.   LABORATORIES:  White blood cell count is 16.6, hemoglobin 14.7, hematocrit  42.6%, platelets 304,000.  The patient's sodium is 134.  The potassium is  5.5 but it is commented to be hemolyzed.  Chloride 98, CO2 11, BUN  18,  creatinine 1.7, glucose 500.  Bilirubin total 3.2, bilirubin direct 0.6,  bilirubin indirect 2.6.  Acetone small.  Troponin I, POC was normal.  Alkphos 110, albumin 4.2.  Calcium 9.4.  SGOT 33, SGPT 13.  Total protein  6.7.  ABG with pH of 7.248, pCO2 of 23.5, pO2 of 95.4, bicarb of 9.9, O2  saturation of 96.2%.  EKG shows sinus tachycardia.  There is also early  repolarization.   ASSESSMENT AND PLAN:  1.  Diabetic ketoacidosis.  This is secondary to noncompliance with the      patient's home medications and overall poor management of his diabetes      mellitus.  We will admit the patient into the medical intensive care      unit for very close observation.  We will aggressively hydrate the      patient with IV fluids.  He is currently finishing the second L of 0.9      normal saline.  We will switch to half normal saline and run that at 200      cc per hour.  We will add KCl 20 mEq/L of IV fluid that the patient      receives.  We will start glycinamide per protocol.  We will monitor the      patient's anion gap and bicarb very closely.  We will also monitor his      electrolytes, including his potassium phosphate, very closely.  2.  Chest pain.  This is most likely musculoskeletal in nature.  There is      definitely a reproducible component to this.  We will get a chest x-ray,      however, and we will provide Toradol for pain.  3.  Nausea and emesis.  This is secondary to #1, which is DKA.  We will      provide Phenergan 12.5 mg IV p.r.n. q.8h. for now.  4.  Constipation.  We will provide stool softener daily.  5.  Gastrointestinal prophylaxis.  We will provide Protonix 40 mg p.o.      daily.  6.  Elevated liver enzymes.  The etiology of this is questionable at this      particular time.  We will order an ultrasound of the  liver for further      evaluation.  7.  Renal insufficiency.  This is more likely to be prerenal in a patient     who has DKA and reports bouts of nausea  and polyuria.  We will      aggressively hydrate the patient and will monitor the BUN and creatinine      very closely.      This should resolve with adequate hydration.  8.  Leukocytosis.  This may be secondary to a respiratory tract infection      that the patient may have.  Again, we will order a chest x-ray and will      treat empirically with moxifloxacin 400 mg p.o. daily.      OR/MEDQ  D:  06/29/2004  T:  06/29/2004  Job:  [id number]

## 2010-09-02 NOTE — Discharge Summary (Signed)
NAMEPAULINE, TRAINER NO.:  [id number]   MEDICAL RECORD NO.:  [id number]          PATIENT TYPE:  INP   LOCATION:  3315                         FACILITY:  [facility]   PHYSICIAN:  Dennis Bast, MD        DATE OF BIRTH:  [birth year]   DATE OF ADMISSION:  09/28/2005  DATE OF DISCHARGE:  09/30/2005                                 DISCHARGE SUMMARY   DISCHARGE DIAGNOSES:  1.  Diabetic ketoacidosis on type 1 diabetes mellitus secondary to      noncompliance.  2.  Acute respiratory failure secondary to volume depletion, resolved.  3.  Mild depression.   DISCHARGE MEDICATIONS:  1.  Lantus 20 units subcutaneously q.h.s.  2.  NovoLog sliding scale.  Moderate sliding scale given to the patient.  3.  Potassium 40 mEq by mouth x1.   DISPOSITION:  The patient is to go home with a scheduled BMET on Monday at  the outpatient clinic and followup at [facility].  His appointment for  [facility] will be scheduled on Monday.  He was discharged during the  weekend.   PROCEDURES:  Chest x-ray on September 28, 2005.  Impression - chronic changes as  described above.  No acute process.   HISTORY OF PRESENT ILLNESS:  A 35 year old African American man with past  medical history positive for diabetes mellitus, type 1, which was diagnosed  6 years ago and history of frequent admission for DKA secondary to  noncompliance.  Presented to the emergency room this a.m. with nausea,  vomiting, not feeling well, diarrhea x2 days, chest discomfort.  The patient  stated that 2 days ago he went to a fishing trip and forgot his insulin.  Two days prior to admission he has not had insulin injection.  By the second  day he started having nausea, vomiting and diarrhea.  His diabetes mellitus  never was well controlled.  His hemoglobin A1c is around 11 to 11.8.  He  uses Lantus 23 units and sliding scale insulin.   ALLERGIES:  SHELLFISH.   LABS ON ADMISSION:  Acetone in blood small.  ABG:  pH 7.32, pCO2 of  15, pO2  103, bicarb 8.  Lipase 17.  Urinalysis:  pH 5.5, specific gravity 1.026.  White blood cell count 14.4, hemoglobin 14.7, hematocrit 42.1, platelets  276.  Sodium 133, potassium 4.5, chloride 98, bicarb 7, BUN 30, creatinine  2.0, glucose 587.  ANC 11.6.  MCV 93.3.  Anion gap 28, bilirubin 2.5,  alkaline phosphatase 101, AST 28, ALT 31, protein 6.9, albumin 4.6, calcium  9.2.   HOSPITAL COURSE:  1.  DKA on type 1 diabetes mellitus.  The patient was started on initially      in the emergency room on Glucommander, then he was transitioned to an      insulin drip with CBGs 2 an hour BMET q.2 h.  The patient was able to      close the gap the same day afternoon and then was transitioned to Lantus      plus sliding scale insulin.  After he started eating blood sugars  were      uncontrolled again and he was started on Glucommander over the night.      The next day NPH 15 units were given in the morning and 15 units of      Lantus at night.  Today at the day of the admission, the patient is      completely asymptomatic, eating okay and with blood sugars under      control.  2.  Acute respiratory failure secondary to volume depletion.  This is      secondary to the nausea, vomiting and diarrhea and poor intake, and      polyuria.  Acute respiratory failure resolved with the use of IV fluids      and treatment of his DKA and creatinine at discharge is 0.8.  3.  Mild depression.  The patient is scheduled for mental health to followup      on this.   LABS AT DISCHARGE:  TSH 2.420.  Sodium 140, potassium 3.1, chloride 107, CO2  of 28, glucose 101, BUN 8, creatinine 0.8, calcium 8.3.  White blood cell  count 8.0, hemoglobin 12.6, hematocrit 36.6, MCV 92.8, platelet count 228.  Magnesium 1.6.  Total iron 110.  Total iron-binding capacity 225.  Percentage of saturation 49.  Vitamin B12 796.  Ferritin 340 and folic acid  7.6.   NOTE:  The patient was hypokalemia this morning.  He received 40  mg of KCl  and he is to receive 40 mg p.o. tomorrow.  On Monday he will get a BMET to  follow on this.      Dennis Bast, MD     YC/MEDQ  D:  09/30/2005  T:  09/30/2005  Job:  [id number]   cc:   [facility]

## 2010-09-02 NOTE — H&P (Signed)
[name], [name] NO.:  [id number]   MEDICAL RECORD NO.:  [id number]          PATIENT TYPE:  EMS   LOCATION:  ED                           FACILITY:  [facility]   PHYSICIAN:  Hollice Espy, M.D.DATE OF BIRTH:  17-Aug-1975   DATE OF PROCEDURE:  DATE OF DISCHARGE:                      STAT - MUST CHANGE TO CORRECT WORK TYPE   PRIMARY CARE PHYSICIAN:  Corinna L. Lendell Caprice, M.D.   CHIEF COMPLAINT:  Abdominal pain, nausea and vomiting.   HISTORY OF PRESENT ILLNESS:  The patient is a 35 year old African American  male with past medical history of diabetes mellitus type 1, who presents to  the emergency room after her ran out of his insulin approximately three days  ago.  For the last 24 hours, he was having problems with abdominal pain,  nausea, and vomiting and not feeling well.  When he presented to the  emergency room, he was noted to have a heart rate of 118 and also concerning  was his lab work showing a creatinine of 1.8 & white count 11.1, and a  bicarb level of 10 and a glucose of 481.  It was felt the patient was in  DKA.  He was started on IV fluids and IV insulin.  Currently complains of  abdominal pain with some burning radiating up into his chest.  He denies any  headaches or vision changes. No dysphagia, no palpitations. No shortness of  breath, wheezing or coughing.  No hematuria, dysuria, constipation,  diarrhea, focal extremity numbness, weakness or pain.  His review of systems  is otherwise negative.   PAST MEDICAL HISTORY:  1.  Diabetes mellitus.  2.  Medical non-compliance.   MEDICATIONS:  1.  Lantus 20 units subcutaneously t.i.d..  2.  Insulin sliding scale insulin.  3.  I believe an ACE inhibitor.   ALLERGIES:  He is allergic to shellfish.   HABITS:  He also denies any alcohol, tobacco, or drug use.   FAMILY HISTORY:  Noncontributory.   PHYSICAL EXAMINATION:  VITAL SIGNS:  On admission, temperature 98.6, heart  rate 118, blood  pressure; 117/82.  Respirations 20.  Oxygen 100% on room  air.  Marland Kitchen  GENERAL:  The patient is alert and oriented x3. In some moderate distress  secondary to abdominal pain.  HEENT:  Normocephalic, atraumatic.  Mucous membranes are dry.  He has no  carotid bruits.  HEART:  Regular rhythm.  Mild tachycardia.  LUNGS:  Clear to auscultation bilaterally.  ABDOMEN:  Soft, nondistended.  Some diffuse generalized tenderness.  Positive bowel sounds.  EXTREMITIES:  No clubbing, cyanosis or edema.   LABORATORY DATA:  Sodium 137, potassium 4.5, chloride 100, bicarb 10, BUN  14, creatinine 1.8, glucose 481, calcium 9.4.  His anion gap is 27.  Small  serum acetone. White count 11.1, hemoglobin 16.5, hematocrit 48.  MCV 91,  platelet count 360.   ASSESSMENT/PLAN:  1.  Diabetic ketoacidosis.  Will initiate diabetic ketoacidosis protocol.      The patient NPO.  Put him on IV fluids as well as insulin and      Glucommander.  Check a BMET q.4h.  until his anion gap was resolved and      his sugars were below 200.  At that time we will switch him over to      subcutaneous insulin and a diet.  2.  Abdominal pain with some chest radiation and burning, likely acid      reflux.  Will go ahead and start IV Protonix.  3.  Renal insufficiency.  Will hydrate and see if this improves.  4.  Medical non-compliance.  Will counsel the patient.  Make sure he better      takes his medications and does not run out.      Hollice Espy, M.D.  Electronically Signed     SKK/MEDQ  D:  05/06/2005  T:  05/06/2005  Job:  [id number]

## 2010-09-02 NOTE — Discharge Summary (Signed)
[name], [name]             ACCOUNT NO.:  [id number]   MEDICAL RECORD NO.:  [id number]          PATIENT TYPE:  INP   LOCATION:  1402                         FACILITY:  [facility]   PHYSICIAN:  Corinna L. Lendell Caprice, MDDATE OF BIRTH:  [birth year]   DATE OF ADMISSION:  05/05/2005  DATE OF DISCHARGE:  05/07/2005                                 DISCHARGE SUMMARY   DISCHARGE DIAGNOSES:  1.  Diabetic ketoacidosis secondary to noncompliance.  2.  Type 1 diabetes.  3.  Leukocytosis without evidence of infection.  4.  Hypokalemia.  5.  Resolved acute renal insufficiency.   DISCHARGE MEDICATIONS:  Lantus has been increased to 22 units subcutaneously  daily and may need to be increased further. He is to continue his Humalog  sliding scale with meals as well as his other medications.   ACTIVITY:  Ad lib.   DIET:  Diabetic. He is encouraged to be more compliant with monitoring blue  blood glucoses.   CONDITION:  Stable.   CONSULTATIONS:  None.   PROCEDURES:  None.   PERTINENT LABORATORY DATA:  CBC on admission was significant for a white  blood cell count of 11,000 which increased to the 18,000 but decreased to  8.8 thousand without any treatment. His ABG showed a pH of 7.193, pCO2 of  17, pO2 of 114, bicarbonate of 6, base excess of 21 and oxygen saturation  97% on room air. Initial BMET showed a sodium of 137, potassium of 4.5,  chloride 100, bicarbonate 10, glucose 41, BUN 14, creatinine 1.8. Blood  acetone was small. Hemoglobin A1c was 11.8. At discharge, his bicarbonate  was 24 and his creatinine was 1.9. His potassium was 3.1. chest x-ray  negative. UA showed ketones and glucose, no leukocyte esterase or nitrites.  Small blood and negative protein.   HISTORY AND HOSPITAL COURSE:  [name] is a 35 year old black male  diabetic. Unfortunately, the dictated H&P has not yet been transcribed but  he came in with DKA. He was admitted by Dr. Virginia Rochester. He admitted  that he had  stopped taking his insulin as he ran out. He has not checked his  sugars for a long time. He  was given IV fluids and IV insulin. He according to records has been  noncompliant in the past. I stressed compliance and importance of checking  his sugars. He voiced understanding. At the time of discharge, his DKA was  resolved and he is being discharged in stable condition. Please see H&P for  full details.      Corinna L. Lendell Caprice, MD  Electronically Signed     CLS/MEDQ  D:  05/07/2005  T:  05/08/2005  Job:  [id number]   cc:   [facility]

## 2010-09-02 NOTE — Discharge Summary (Signed)
NAMEGEROLD, SAR             ACCOUNT NO.:  [id number]   MEDICAL RECORD NO.:  [id number]          PATIENT TYPE:  INP   LOCATION:  0153                         FACILITY:  [facility]   PHYSICIAN:  Mobolaji B. Bakare, M.D.DATE OF BIRTH:  1975/12/12   DATE OF ADMISSION:  06/29/2004  DATE OF DISCHARGE:  06/30/2004                                 DISCHARGE SUMMARY   PRIMARY CARE PHYSICIAN:  Patient obtained [facility].   FINAL DIAGNOSES:  1.  Diabetic ketoacidosis.  2.  Type 1 diabetes mellitus, uncontrolled.  3.  Acute renal failure.  4.  Dehydration.  5.  Polysubstance abuse.  6.  Hyperbilirubinemia.   CHIEF COMPLAINT:  Dizziness and vomiting.   Mr. Boer is a 35 year old African-American male with history of type 1  diabetes mellitus and recent hospitalization for DKA secondary to  noncompliance.  The patient  presented with dizziness and vomiting.  He last  used his medications four days prior to admission; hence, he developed the  above symptoms.  In addition he had polyuria, polydipsia, with accompanying  nonproductive cough but there was no objective fever.  He was seen in the  emergency department with the blood sugar of 500, a pH of 7.24 and bicarb of  10.  He was aggressively treated with IV insulin and IV fluids and was  admitted into step-down care unit.   PERTINENT PHYSICAL FINDINGS:  VITAL SIGNS ON INITIAL ADMISSION:  Temperature  97.2, blood pressure 105/63, heart rate of 93, respiratory rate 22, O2  saturation 99% on room air.  GENERAL EXAMINATION:  He was dehydrated.  REST OF PHYSICAL EXAMINATION:  Unremarkable.   PERTINENT LABORATORY FINDINGS:  ABG:  A pH of 7.248, pCO2 24, pO2 95, bicarb  9.9, O2 saturation 96% .  White cells 16.6.  Hemoglobin 14.7, hematocrit  42.6, platelets 304,000.  Sodium 134, potassium 5.5, chloride 98, CO2 11,  BUN 18, creatinine 1.7, glucose 500, bilirubin 3.2, direct 0.6, indirect  2.6, acetone small.  Alkaline phosphatase 110,  albumin 4.2, calcium 9.4,  SGOT 53, SGPT 15.  Total protein 6.7, retic count 2.7, absolute retic count  108 which are within normal, anion gap 35.  Hemoglobin A1c 11.0.  Urine drug  screen was positive for cocaine.  Urine positive for glucose greater than  1000 and ketones greater than 80.  Cardiac enzymes at the point of care were  negative.  EKG showed sinus tachycardia with a heart rate of 108.   HOSPITAL COURSE:  1.  Mr. Koranda was admitted at step-down unit for stabilization.  He was      hydrated and started on insulin drip.  His anion gap closed nicely, and      his bicarbonate improved.  He was then corrected to Lantus subcutaneous      and at the same time, the patient was able to tolerate p.o.  In      addition, he had sliding-scale insulin coverage to the Lantus.  Of note,      the patient was on insulin 70/30 prior to admission two times a day.  His hemoglobin A1c showed clearly that his blood sugar was uncontrolled.      Hence, he was started on a four-shot per day regimen which includes the      Lantus and insulin with meals.  Patient was seen by the diabetic      educator and he was counseled on diabetic treatment and the need to be      compliant to be with his medications, and he was also taught on the      current medications of Lantus 20 units q.24h. and NovoLog t.i.d. with      meals.  The patient stated understanding of this process.  Next, he was      seen by the care manager and it was determined that patient was grossly      at [facility] and his eligibility expired, probably that explains why      he has been noncompliant with these medications.  He was re-instated and      linked up with Dr. Iline Oven.  The patient will continue to follow up at      [facility].  2.  Acute renal failure.  This subsequently improved with rehydration and      creatinine at time of discharge was 1.0, BUN was 11.  3.  Polysubstance abuse.  The patient was seen by social worker  and      arrangement was made to follow up with [facility] and he would follow up for      additional counseling.  4.  Hyperbilirubinemia.  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  Of note, is the patient had similar hyperbilirubinemia      predominantly indirect, on previous admission, when was in diabetic      ketoacidosis.   DISCHARGE CONDITION:  The patient was symptom-free and was deemed stable to  be discharged and was hemodynamically stable.   DISCHARGE MEDICATIONS:  1.  Lantus insulin 20 units q.a.m.  2.  Pepcid 10 mg, two tablets once a day.  3.  Mylanta 15-30 mg for hour of sleep as needed for acid reflux.  4.  Claritin 10 mg once daily as needed for allergies.  5.  Motrin 400 mg every 6 hours as needed for pain.  6.  NovoLog insulin sliding scale.   DIET:  The patient was instructed to follow up with diabetic diet as  counseled.   FOLLOWUP:  [facility].      MBB/MEDQ  D:  07/07/2004  T:  07/07/2004  Job:  [id number]

## 2010-09-21 ENCOUNTER — Inpatient Hospital Stay (HOSPITAL_COMMUNITY)
Admission: EM | Admit: 2010-09-21 | Discharge: 2010-09-23 | DRG: 639 | Disposition: A | Discharge status: Home / Self Care | Payer: Self-pay | Attending: Internal Medicine | Admitting: Internal Medicine

## 2010-09-21 ENCOUNTER — Emergency Department (HOSPITAL_COMMUNITY): Payer: Self-pay

## 2010-09-21 DIAGNOSIS — R0789 Other chest pain: Secondary | ICD-10-CM | POA: Diagnosis present

## 2010-09-21 DIAGNOSIS — E876 Hypokalemia: Secondary | ICD-10-CM | POA: Diagnosis present

## 2010-09-21 DIAGNOSIS — F17200 Nicotine dependence, unspecified, uncomplicated: Secondary | ICD-10-CM | POA: Diagnosis present

## 2010-09-21 DIAGNOSIS — Z9119 Patient's noncompliance with other medical treatment and regimen: Secondary | ICD-10-CM

## 2010-09-21 DIAGNOSIS — Z91199 Patient's noncompliance with other medical treatment and regimen due to unspecified reason: Secondary | ICD-10-CM

## 2010-09-21 DIAGNOSIS — R0602 Shortness of breath: Secondary | ICD-10-CM | POA: Diagnosis present

## 2010-09-21 DIAGNOSIS — E860 Dehydration: Secondary | ICD-10-CM | POA: Diagnosis present

## 2010-09-21 DIAGNOSIS — I498 Other specified cardiac arrhythmias: Secondary | ICD-10-CM | POA: Diagnosis present

## 2010-09-21 DIAGNOSIS — E1101 Type 2 diabetes mellitus with hyperosmolarity with coma: Principal | ICD-10-CM | POA: Diagnosis present

## 2010-09-21 DIAGNOSIS — F1910 Other psychoactive substance abuse, uncomplicated: Secondary | ICD-10-CM | POA: Diagnosis present

## 2010-09-21 DIAGNOSIS — Z794 Long term (current) use of insulin: Secondary | ICD-10-CM

## 2010-09-21 LAB — LIPASE, BLOOD: Lipase: 40 U/L (ref 11–59)

## 2010-09-21 LAB — COMPREHENSIVE METABOLIC PANEL
ALT: 18 U/L (ref 0–53)
Alkaline Phosphatase: 161 U/L — ABNORMAL HIGH (ref 39–117)
CO2: 20 mEq/L (ref 19–32)
Chloride: 92 mEq/L — ABNORMAL LOW (ref 96–112)
GFR calc non Af Amer: >60 mL/min (ref >60)
Glucose, Bld: 715 mg/dL (ref 70–99)
Potassium: 4.7 mEq/L (ref 3.5–5.1)
Sodium: 129 mEq/L — ABNORMAL LOW (ref 135–145)
Total Bilirubin: 0.4 mg/dL (ref 0.3–1.2)

## 2010-09-21 LAB — BLOOD GAS, ARTERIAL
Acid-Base Excess: 2 mmol/L (ref 0.0–2.0)
Drawn by: 313061
O2 Saturation: 99.2 %
TCO2: 15.7 mmol/L (ref 0–100)
pCO2 arterial: 14.3 mmHg — CL (ref 35.0–45.0)

## 2010-09-21 LAB — CK TOTAL AND CKMB (NOT AT ARMC): Total CK: 128 U/L (ref 7–232)

## 2010-09-21 LAB — CBC
HCT: 42.7 % (ref 39.0–52.0)
Hemoglobin: 15.4 g/dL (ref 13.0–17.0)
MCV: 87.3 fL (ref 78.0–100.0)
WBC: 8.5 10*3/uL (ref 4.0–10.5)

## 2010-09-21 LAB — DIFFERENTIAL
Basophils Absolute: 0 10*3/uL (ref 0.0–0.1)
Lymphocytes Relative: 36 % (ref 12–46)
Lymphs Abs: 3 10*3/uL (ref 0.7–4.0)
Neutro Abs: 4.8 10*3/uL (ref 1.7–7.7)

## 2010-09-21 LAB — URINALYSIS, ROUTINE W REFLEX MICROSCOPIC
Glucose, UA: >1000 mg/dL — AB
Hgb urine dipstick: NEGATIVE
Ketones, ur: 15 mg/dL — AB
Leukocytes, UA: NEGATIVE
pH: 8 (ref 5.0–8.0)

## 2010-09-21 LAB — GLUCOSE, CAPILLARY: Glucose-Capillary: >600 mg/dL (ref 70–99)

## 2010-09-21 LAB — TROPONIN I: Troponin I: <0.3 ng/mL (ref <0.30)

## 2010-09-21 LAB — URINE MICROSCOPIC-ADD ON: Urine-Other: NONE SEEN

## 2010-09-22 ENCOUNTER — Inpatient Hospital Stay (HOSPITAL_COMMUNITY): Payer: Self-pay

## 2010-09-22 LAB — COMPREHENSIVE METABOLIC PANEL
ALT: 14 U/L (ref 0–53)
AST: 15 U/L (ref 0–37)
Albumin: 3.1 g/dL — ABNORMAL LOW (ref 3.5–5.2)
CO2: 24 mEq/L (ref 19–32)
Calcium: 7.9 mg/dL — ABNORMAL LOW (ref 8.4–10.5)
Chloride: 108 mEq/L (ref 96–112)
Creatinine, Ser: 0.81 mg/dL (ref 0.4–1.5)
GFR calc Af Amer: >60 mL/min (ref >60)
GFR calc non Af Amer: >60 mL/min (ref >60)
Sodium: 140 mEq/L (ref 135–145)
Total Bilirubin: 0.2 mg/dL — ABNORMAL LOW (ref 0.3–1.2)

## 2010-09-22 LAB — GLUCOSE, CAPILLARY
Glucose-Capillary: 173 mg/dL — ABNORMAL HIGH (ref 70–99)
Glucose-Capillary: 241 mg/dL — ABNORMAL HIGH (ref 70–99)
Glucose-Capillary: 267 mg/dL — ABNORMAL HIGH (ref 70–99)
Glucose-Capillary: 321 mg/dL — ABNORMAL HIGH (ref 70–99)
Glucose-Capillary: 347 mg/dL — ABNORMAL HIGH (ref 70–99)

## 2010-09-22 LAB — DIFFERENTIAL
Lymphocytes Relative: 40 % (ref 12–46)
Lymphs Abs: 3.4 10*3/uL (ref 0.7–4.0)
Monocytes Absolute: 0.9 10*3/uL (ref 0.1–1.0)
Monocytes Relative: 11 % (ref 3–12)
Neutro Abs: 4 10*3/uL (ref 1.7–7.7)
Neutrophils Relative %: 47 % (ref 43–77)

## 2010-09-22 LAB — CBC
HCT: 35.3 % — ABNORMAL LOW (ref 39.0–52.0)
Hemoglobin: 12.6 g/dL — ABNORMAL LOW (ref 13.0–17.0)
MCH: 31.5 pg (ref 26.0–34.0)
MCHC: 35.7 g/dL (ref 30.0–36.0)
RBC: 4 MIL/uL — ABNORMAL LOW (ref 4.22–5.81)

## 2010-09-22 LAB — CARDIAC PANEL(CRET KIN+CKTOT+MB+TROPI)
Relative Index: 1.3 (ref 0.0–2.5)
Relative Index: 1.4 (ref 0.0–2.5)
Troponin I: <0.3 ng/mL (ref <0.30)

## 2010-09-22 NOTE — H&P (Signed)
NAMEJONTAVIUS, [name] NO.:  [id number]  MEDICAL RECORD NO.:  [id number]  LOCATION:  WLED                         FACILITY:  [facility]  PHYSICIAN:  Talmage Nap, MD  DATE OF BIRTH:  10/01/1975  DATE OF ADMISSION:  09/21/2010 DATE OF DISCHARGE:                             HISTORY & PHYSICAL   PRIMARY CARE PHYSICIAN:  Dr. Dennard Nip, [facility].  History obtainable from the patient.  CHIEF COMPLAINT IS:  Chest discomfort which started today, September 21, 2010.  HISTORY OF PRESENT ILLNESS:  The patient is a 35 year old African American male with history of diabetes mellitus diagnosed in 2001 and has been on Lantus insulin, presenting to the emergency room with chest discomfort, which started today.  The patient claimed that for about a week or two, he has been out of his Lantus insulin because he was unable to get it from [facility].  Today, however, he said he developed some chest discomfort and this was associated with multiple episodes of nausea and vomiting.  He had 1 to 2 episodes of diarrhea.  The chest discomfort was said to be associated with shortness of breath and he had subjective feeling of fever.  Denied any chills.  Denied any rigor. The patient also denied any history of cough, but the vomiting was said to have persisted and the patient was getting progressively weak, hence he presented to the emergency room to be evaluated.  PAST MEDICAL HISTORY:  Positive for diabetes.  PAST SURGICAL HISTORY:  No known past surgical history.  MEDICATIONS:  Preadmission meds include: 1. Lantus insulin, dose unknown. 2. Actos, dose unknown. 3. Humulin insulin.  ALLERGIES:  IODINE and SHELLFISH.  SOCIAL HISTORY:  The patient smokes on a regular basis, cannot quantify. He also drinks on a regular basis, cannot quantify, and periodically he uses street drugs, cocaine and marijuana.  He is currently unemployed.  FAMILY HISTORY:  York Spaniel to be positive for  diabetes mellitus.  REVIEW OF SYMPTOMS:  The patient denies any history of headaches.  No blurred vision.  Complained of nausea, but has not vomited since being in the emergency room.  Chest discomfort has resolved.  Denies any PND or orthopnea.  No fever.  No chills.  No rigor.  No abdominal discomfort.  No diarrhea or hematochezia.  No dysuria or hematuria.  No swelling of the lower extremities.  No intolerance to heat or cold, and no neuropsychiatric disorder.  PHYSICAL EXAMINATION:  GENERAL:  On examination, young man, dehydrated, not in any obvious respiratory distress. VITAL SIGNS:  Blood pressure is 104/78, pulse is 102, respiratory rate 20, temperature is 99.5. HEENT:  Pupils are reactive to light and extraocular movements are intact. NECK:  He has no jugular venous distention.  No carotid bruit, no lymphadenopathy. CHEST:  Clear to auscultation.  Slightly tachycardic. ABDOMEN:  Soft, nontender.  Liver, spleen, kidneys not palpable.  Bowel sounds are positive. EXTREMITIES:  No pedal edema. NEUROLOGIC EXAM:  Nonfocal. MUSCULOSKELETAL SYSTEM:  Unremarkable. SKIN:  Showed decreased turgor.  LABORATORY DATA:  Arterial blood gas done on patient showed pH of 7.72, pCO2 of 40.3, pO2 of 116 with a bicarb of 19.2.  Chemistry shows sodium  of 129, potassium of 4.7, chloride of 19 with a bicarb of 20, glucose is 717.  BUN is 15, creatinine is 1.25.  First set of cardiac markers, troponin-I less than 0.30.  Lipase is 40.  Hematological indices showed WBC of 8.5, hemoglobin of 12.4, hematocrit of 42.7, MCV of 87.3  with a platelet count of 232, with normal differentials.  Urinalysis unremarkable.  IMPRESSION: 1. Nonketotic hyperosmolar hyperglycemia. 2. Dehydration. 3. Tachycardia. 4. Noncompliant with medications.  PLAN:  The patient will be admitted to step-down unit.  He will be on normal saline IV to go at rate of 200 cc an hour and he will continue with glucose stabilizer.   Accu-Cheks will be done hourly.  If blood sugar level is less than 250 mg%,  will give Lantus insulin 30 units subcut 30 minutes before stopping IV insulin.  Thereafter, the patient will start Accu-Cheks every 4  hourly with regular insulin sliding scale (moderate scale).  He will then be on Lantus insulin 30 units subcu q.h.s.  The patient will be given aspirin 325 mg p.o. daily and morphine 2 mg IV q.4 p.r.n. for chest pain.  He will empirically be started on Rocephin 1 g IV q.24 and his tachycardia will be controlled with Lopressor 25 mg p.o. b.i.d.  GI prophylaxis will be with Protonix 40 mg IV q.24 and DVT prophylaxis with Lovenox 40 mg subcu q.24.  Further lab work to be done on this patient will include cardiac enzymes q.6 x3, blood culture x2, and urine culture before starting IV antibiotics. Urine drug screen, hemoglobin A1c, CBC, CMP, and magnesium will be done in a.m.  The patient will be followed and evaluated on a daily basis.     Talmage Nap, MD     CN/MEDQ  D:  09/21/2010  T:  09/22/2010  Job:  [id number]  Electronically Signed by Talmage Nap  on 09/22/2010 02:55:21 AM

## 2010-09-23 LAB — BASIC METABOLIC PANEL
BUN: 9 mg/dL (ref 6–23)
CO2: 29 mEq/L (ref 19–32)
Calcium: 8.7 mg/dL (ref 8.4–10.5)
Chloride: 104 mEq/L (ref 96–112)
Creatinine, Ser: 0.8 mg/dL (ref 0.4–1.5)
Glucose, Bld: 173 mg/dL — ABNORMAL HIGH (ref 70–99)

## 2010-09-23 LAB — GLUCOSE, CAPILLARY
Glucose-Capillary: 122 mg/dL — ABNORMAL HIGH (ref 70–99)
Glucose-Capillary: 181 mg/dL — ABNORMAL HIGH (ref 70–99)

## 2010-09-23 LAB — CBC
HCT: 36.9 % — ABNORMAL LOW (ref 39.0–52.0)
Hemoglobin: 13.2 g/dL (ref 13.0–17.0)
MCH: 31.7 pg (ref 26.0–34.0)
MCHC: 35.8 g/dL (ref 30.0–36.0)
MCV: 88.5 fL (ref 78.0–100.0)
RBC: 4.17 MIL/uL — ABNORMAL LOW (ref 4.22–5.81)

## 2010-09-23 LAB — URINE CULTURE
Culture: NO GROWTH
Special Requests: NEGATIVE

## 2010-09-23 LAB — RAPID URINE DRUG SCREEN, HOSP PERFORMED
Amphetamines: NOT DETECTED
Barbiturates: NOT DETECTED
Opiates: NOT DETECTED

## 2010-09-28 LAB — CULTURE, BLOOD (ROUTINE X 2): Culture  Setup Time: 201206071020

## 2010-09-28 NOTE — Discharge Summary (Signed)
[name], [name] NO.:  [id number]  MEDICAL RECORD NO.:  [id number]  LOCATION:  1432                         FACILITY:  [facility]  PHYSICIAN:  Ramiro Harvest, MD    DATE OF BIRTH:  Mar 22, 1976  DATE OF ADMISSION:  09/21/2010 DATE OF DISCHARGE:  09/23/2010                        DISCHARGE SUMMARY   PRIMARY CARE PHYSICIAN:  Dr. Dennard Nip at [facility].  DISCHARGE DIAGNOSES: 1. Hyperosmolar nonketotic hyperglycemia, resolved. 2. Dehydration, resolved. 3. Poorly-controlled diabetes mellitus type 2 with A1c of 10.1. 4. Hypokalemia, resolved. 5. Tobacco abuse. 6. History of polysubstance abuse. 7. Medication noncompliance.  DISCHARGE MEDICATIONS: 1. Actos 15 mg p.o. daily. 2. Levaquin 500 mg p.o. daily x3 days. 3. Ibuprofen 200 mg 2 to 3 tablets p.o. daily p.r.n. 4. Lantus 30 units subcu daily. 5. Sliding scale insulin.  DISPOSITION AND FOLLOWUP:  The patient will be discharged home.  The patient is to follow up at [facility] on October 06, 2010, at 4:15 p.m. for eligibility.  He has  also to follow up with Dr. __________ at [facility] on October 10, 2010, at 8:30 a.m. for hospital followup.  On followup, the patient's diabetes will need to be reassessed and importance of compliance with his medication will need to be instituted to the patient, tobacco cessation will also need to be addressed at that time.  CONSULTATIONS:  None.  PROCEDURES PERFORMED: 1. A chest x-ray was done September 21, 2010, that showed no acute     cardiopulmonary findings. 2. An acute abdominal series was done September 22, 2010, that showed clear     lungs, right upper quadrant opacity 1.4 cm diameter, suspect bowel     artifact from radiodense medication, as discussed above.  BRIEF ADMISSION HISTORY AND PHYSICAL:  Mr. [name] is a 35 year old African American gentleman with history of diabetes diagnosed in 2001 who has been on Lantus presented to the ED with chest discomfort which  started on the day of admission.  The patient claimed that for about 1 to 2 weeks, he had been out of his Lantus insulin because he was unable to get it from [facility].  On the day of admission, however, the patient stated that he developed some chest discomfort associated with multiple episodes of nausea and vomiting.  He had about 1 to 2 episodes of diarrhea as well.  The patient's chest discomfort was said to be associated with shortness of breath as well as subjective fevers. The patient denied any chills, no rigors, also denied any history of cough but vomiting was said to be persistent.  The patient was getting progressively weak, hence he presented to the ED for further evaluation. For the rest of admission history and physical, please see H and P dictated per Dr. Beverly Gust of job number [id number].  HOSPITAL COURSE: 1. Hyperosmolar nonketotic hyperglycemia:  The patient was admitted     with hyperosmolar nonketotic hyperglycemia felt to be secondary to     medication noncompliance due to inability to get his medications     from [facility].  The patient was admitted, placed on tele.  He     was placed on insulin IV drip as well as hydrated with IV fluids.  Due to his chest discomfort, cardiac enzymes were cycled, which     were negative x3.  The patient's chest discomfort improved during     the hospitalization and had resolved by day of discharge.  Lipase     levels which were obtained were within normal limits.  CBC which     was obtained was unremarkable.  Urinalysis which was also obtained     was negative.  Chest x-ray which was also obtained was negative.     Blood cultures were obtained due to the patient's subjective fevers     and presentation, he was placed empirically on IV Rocephin.  The     patient was monitored and followed during the hospitalization.  He     was subsequently transitioned to subcutaneous Lantus as well as     sliding scale insulin.  Hemoglobin A1c  which was obtained during     this hospitalization came back elevated at 10.1.  An inpatient     diabetes education/consultation was obtained.  The patient was seen     by the diabetes educator.  The patient improved clinically during     the hospitalization.  Acute abdominal series which was done was     unremarkable.  The patient improved clinically, was tolerating     p.o.'s.  He was hydrated with IV fluids and was euvolemic by the     day of discharge.  He will be discharged home on 30 units of Lantus     as well as oral Actos and his sliding-scale insulin which were his     home regimen.  He will need to follow up with [facility] for     eligibility requirement so he can get his medication, also to     follow up as an outpatient with a good Dr. __________ at     [facility].  The patient will be discharged in stable and improved     condition.  The patient was placed empirically on IV Rocephin.  The     patient received 3 days of IV Rocephin during this hospitalization.     He will be discharged home on oral Levaquin for 3 more days to     complete a course of antibiotic therapy. 2. Dehydration:  On admission, the patient was noted to be dehydrated.     The patient was hydrated with IV fluids and was euvolemic by day of     discharge. 3. Sinus tachycardia:  On admission, the patient was noted to be in     sinus tachycardia.  It was felt this was likely secondary to     problem #1.  The patient was placed on insulin drip, hydrated with     IV fluids.  Once his problem #1 had resolved, his sinus tachycardia     also resolved.  He was initially placed on oral beta-blocker which     was discontinued.  The patient remained rate-controlled, and was     discharged in stable and improved condition.  On day of discharge,     the patient's sinus tachycardia had resolved. 4. Poorly-controlled type 2 diabetes with A1c of 10.1:  The patient     was admitted secondary to problem #1.  A1c which  was obtained came     back elevated at 10.1, likely secondary to medication     noncompliance.  The patient has been maintained on Lantus.  He was     seen by Case  Manager and Social Work as the patient did state that     he was unable to get his insulin secondary to inability to get it     from [employer].  The patient was seen by Case Manager and Medical laboratory scientific officer.  Arrangements have been made for the patient to call and     follow up at [facility] for eligibility requirements as well as     medication assistance.  The patient will be discharged home on     Lantus 30 units daily as well as sliding scale insulin and aspirin.     The patient was discharged in a stable and improved condition. 5. Hypokalemia:  During the hospitalization, the patient was noted to     be hypokalemic which was felt to be secondary to #1 as the     patient's hyperglycemia was being corrected with IV insulin.  His     potassium was repleted and had resolved by day of discharge.  CONDITION ON DISCHARGE:  The patient will be discharged in stable and improved condition.  On the day of discharge, vital signs temperature 97.6, pulse of 88, blood pressure 135/86, respirations 20, saturating 99% on room air.  DISCHARGE LABS:  Sodium 138, potassium 3.6, chloride 104, bicarb 29, BUN 9, creatinine 0.80, glucose of 173, calcium of 8.7.  CBC with a white count of 8.9, hemoglobin 13.2, hematocrit 36.9, and platelet count of 197.  It has been a pleasure taking care of Mr. Nikoloz Huy.     Ramiro Harvest, MD     DT/MEDQ  D:  09/23/2010  T:  09/23/2010  Job:  [id number]  cc:   Dr. __________.  Dr. Dennard Nip at [facility]  Electronically Signed by Ramiro Harvest MD on 09/28/2010 02:43:13 PM

## 2011-01-13 LAB — POCT CARDIAC MARKERS
CKMB, poc: <1 — ABNORMAL LOW
CKMB, poc: <1 — ABNORMAL LOW
Myoglobin, poc: 30.6
Myoglobin, poc: 50.9
Troponin i, poc: <0.05

## 2011-01-13 LAB — POCT I-STAT, CHEM 8
BUN: 7
Chloride: 103
Creatinine, Ser: 1
Potassium: 3.5
Sodium: 139

## 2011-01-13 LAB — URINALYSIS, ROUTINE W REFLEX MICROSCOPIC
Hgb urine dipstick: NEGATIVE
Nitrite: NEGATIVE
Protein, ur: NEGATIVE
Specific Gravity, Urine: 1.004 — ABNORMAL LOW
Urobilinogen, UA: 1

## 2011-01-13 LAB — GLUCOSE, CAPILLARY: Glucose-Capillary: 126 — ABNORMAL HIGH

## 2011-01-13 LAB — HEPATIC FUNCTION PANEL
Alkaline Phosphatase: 65
Indirect Bilirubin: 0.6
Total Protein: 5.8 — ABNORMAL LOW

## 2011-01-13 LAB — CBC
HCT: 43.4
Hemoglobin: 15.3
Platelets: 269
RDW: 13.1
WBC: 12.8 — ABNORMAL HIGH

## 2011-01-13 LAB — URINE CULTURE

## 2011-01-13 LAB — DIFFERENTIAL
Basophils Absolute: 0
Eosinophils Relative: 0
Lymphocytes Relative: 27
Lymphs Abs: 3.4
Neutro Abs: 8.8 — ABNORMAL HIGH

## 2011-01-13 LAB — APTT: aPTT: 30

## 2011-01-13 LAB — LIPASE, BLOOD: Lipase: 14

## 2011-01-16 ENCOUNTER — Emergency Department (HOSPITAL_COMMUNITY)
Admission: EM | Admit: 2011-01-16 | Discharge: 2011-01-16 | Disposition: A | Discharge status: Home / Self Care | Payer: Self-pay | Attending: Emergency Medicine | Admitting: Emergency Medicine

## 2011-01-16 DIAGNOSIS — E119 Type 2 diabetes mellitus without complications: Secondary | ICD-10-CM | POA: Insufficient documentation

## 2011-01-16 DIAGNOSIS — K029 Dental caries, unspecified: Secondary | ICD-10-CM | POA: Insufficient documentation

## 2011-01-16 DIAGNOSIS — K089 Disorder of teeth and supporting structures, unspecified: Secondary | ICD-10-CM | POA: Insufficient documentation

## 2011-01-16 DIAGNOSIS — R220 Localized swelling, mass and lump, head: Secondary | ICD-10-CM | POA: Insufficient documentation

## 2011-01-16 DIAGNOSIS — K047 Periapical abscess without sinus: Secondary | ICD-10-CM | POA: Insufficient documentation

## 2011-01-16 DIAGNOSIS — Z79899 Other long term (current) drug therapy: Secondary | ICD-10-CM | POA: Insufficient documentation

## 2011-01-16 DIAGNOSIS — R221 Localized swelling, mass and lump, neck: Secondary | ICD-10-CM | POA: Insufficient documentation

## 2013-12-27 ENCOUNTER — Inpatient Hospital Stay (HOSPITAL_COMMUNITY): Payer: Self-pay

## 2013-12-27 ENCOUNTER — Encounter (HOSPITAL_COMMUNITY): Payer: Self-pay | Admitting: Emergency Medicine

## 2013-12-27 ENCOUNTER — Inpatient Hospital Stay (HOSPITAL_COMMUNITY)
Admission: EM | Admit: 2013-12-27 | Discharge: 2013-12-30 | DRG: 637 | Disposition: A | Discharge status: Home / Self Care | Payer: Self-pay | Attending: Internal Medicine | Admitting: Internal Medicine

## 2013-12-27 DIAGNOSIS — E1110 Type 2 diabetes mellitus with ketoacidosis without coma: Secondary | ICD-10-CM | POA: Diagnosis present

## 2013-12-27 DIAGNOSIS — E1010 Type 1 diabetes mellitus with ketoacidosis without coma: Principal | ICD-10-CM | POA: Diagnosis present

## 2013-12-27 DIAGNOSIS — E43 Unspecified severe protein-calorie malnutrition: Secondary | ICD-10-CM | POA: Diagnosis present

## 2013-12-27 DIAGNOSIS — F1410 Cocaine abuse, uncomplicated: Secondary | ICD-10-CM | POA: Diagnosis present

## 2013-12-27 DIAGNOSIS — F1110 Opioid abuse, uncomplicated: Secondary | ICD-10-CM | POA: Diagnosis present

## 2013-12-27 DIAGNOSIS — F1910 Other psychoactive substance abuse, uncomplicated: Secondary | ICD-10-CM

## 2013-12-27 DIAGNOSIS — F1020 Alcohol dependence, uncomplicated: Secondary | ICD-10-CM | POA: Diagnosis present

## 2013-12-27 DIAGNOSIS — F17200 Nicotine dependence, unspecified, uncomplicated: Secondary | ICD-10-CM | POA: Diagnosis present

## 2013-12-27 DIAGNOSIS — Z9119 Patient's noncompliance with other medical treatment and regimen: Secondary | ICD-10-CM

## 2013-12-27 DIAGNOSIS — F1210 Cannabis abuse, uncomplicated: Secondary | ICD-10-CM | POA: Diagnosis present

## 2013-12-27 DIAGNOSIS — G629 Polyneuropathy, unspecified: Secondary | ICD-10-CM

## 2013-12-27 DIAGNOSIS — R1013 Epigastric pain: Secondary | ICD-10-CM

## 2013-12-27 DIAGNOSIS — G589 Mononeuropathy, unspecified: Secondary | ICD-10-CM

## 2013-12-27 DIAGNOSIS — Z91199 Patient's noncompliance with other medical treatment and regimen due to unspecified reason: Secondary | ICD-10-CM

## 2013-12-27 DIAGNOSIS — E1310 Other specified diabetes mellitus with ketoacidosis without coma: Secondary | ICD-10-CM

## 2013-12-27 DIAGNOSIS — Z23 Encounter for immunization: Secondary | ICD-10-CM

## 2013-12-27 DIAGNOSIS — IMO0002 Reserved for concepts with insufficient information to code with codable children: Secondary | ICD-10-CM

## 2013-12-27 HISTORY — DX: Type 2 diabetes mellitus without complications: E11.9

## 2013-12-27 LAB — BLOOD GAS, ARTERIAL
Acid-base deficit: 8.4 mmol/L — ABNORMAL HIGH (ref 0.0–2.0)
Bicarbonate: 16.4 mEq/L — ABNORMAL LOW (ref 20.0–24.0)
DRAWN BY: 308601
FIO2: 0.21 %
O2 Saturation: 89.7 %
PCO2 ART: 33.2 mmHg — AB (ref 35.0–45.0)
PH ART: 7.315 — AB (ref 7.350–7.450)
PO2 ART: 58.3 mmHg — AB (ref 80.0–100.0)
Patient temperature: 98.6
TCO2: 14.7 mmol/L (ref 0–100)

## 2013-12-27 LAB — COMPREHENSIVE METABOLIC PANEL
ALBUMIN: 4.5 g/dL (ref 3.5–5.2)
ALT: 37 U/L (ref 0–53)
ANION GAP: 26 — AB (ref 5–15)
AST: 29 U/L (ref 0–37)
Alkaline Phosphatase: 127 U/L — ABNORMAL HIGH (ref 39–117)
BUN: 16 mg/dL (ref 6–23)
CALCIUM: 9.4 mg/dL (ref 8.4–10.5)
CHLORIDE: 91 meq/L — AB (ref 96–112)
CO2: 19 mEq/L (ref 19–32)
CREATININE: 1.03 mg/dL (ref 0.50–1.35)
GFR calc Af Amer: >90 mL/min (ref >90)
GFR calc non Af Amer: 89 mL/min — ABNORMAL LOW (ref >90)
Glucose, Bld: 472 mg/dL — ABNORMAL HIGH (ref 70–99)
Potassium: 4.9 mEq/L (ref 3.7–5.3)
Sodium: 136 mEq/L — ABNORMAL LOW (ref 137–147)
TOTAL PROTEIN: 7.9 g/dL (ref 6.0–8.3)
Total Bilirubin: 0.5 mg/dL (ref 0.3–1.2)

## 2013-12-27 LAB — CBC
HCT: 44.6 % (ref 39.0–52.0)
Hemoglobin: 15.4 g/dL (ref 13.0–17.0)
MCH: 32 pg (ref 26.0–34.0)
MCHC: 34.5 g/dL (ref 30.0–36.0)
MCV: 92.5 fL (ref 78.0–100.0)
PLATELETS: 257 10*3/uL (ref 150–400)
RBC: 4.82 MIL/uL (ref 4.22–5.81)
RDW: 12.6 % (ref 11.5–15.5)
WBC: 10.5 10*3/uL (ref 4.0–10.5)

## 2013-12-27 LAB — URINALYSIS, ROUTINE W REFLEX MICROSCOPIC
Bilirubin Urine: NEGATIVE
Hgb urine dipstick: NEGATIVE
Ketones, ur: >80 mg/dL — AB
LEUKOCYTES UA: NEGATIVE
NITRITE: NEGATIVE
PH: 5.5 (ref 5.0–8.0)
Protein, ur: NEGATIVE mg/dL
SPECIFIC GRAVITY, URINE: 1.031 — AB (ref 1.005–1.030)
Urobilinogen, UA: 0.2 mg/dL (ref 0.0–1.0)

## 2013-12-27 LAB — CBG MONITORING, ED
GLUCOSE-CAPILLARY: 430 mg/dL — AB (ref 70–99)
GLUCOSE-CAPILLARY: 415 mg/dL — AB (ref 70–99)

## 2013-12-27 LAB — URINE MICROSCOPIC-ADD ON: URINE-OTHER: NONE SEEN

## 2013-12-27 LAB — LIPASE, BLOOD: LIPASE: 15 U/L (ref 11–59)

## 2013-12-27 MED ORDER — ONDANSETRON HCL 4 MG/2ML IJ SOLN
4.0000 mg | Freq: Once | INTRAMUSCULAR | Status: AC
  Administered 2013-12-27: 4 mg via INTRAVENOUS
  Filled 2013-12-27: qty 2

## 2013-12-27 MED ORDER — MORPHINE SULFATE 4 MG/ML IJ SOLN
4.0000 mg | Freq: Once | INTRAMUSCULAR | Status: AC
  Administered 2013-12-27: 4 mg via INTRAVENOUS
  Filled 2013-12-27: qty 1

## 2013-12-27 MED ORDER — HYDROMORPHONE HCL PF 1 MG/ML IJ SOLN: 1.0000 mg | INTRAMUSCULAR | Status: DC | PRN

## 2013-12-27 MED ORDER — DEXTROSE-NACL 5-0.45 % IV SOLN: INTRAVENOUS | Status: DC

## 2013-12-27 MED ORDER — ONDANSETRON HCL 4 MG/2ML IJ SOLN: 4.0000 mg | Freq: Three times a day (TID) | INTRAMUSCULAR | Status: DC | PRN

## 2013-12-27 MED ORDER — SODIUM CHLORIDE 0.9 % IV SOLN
INTRAVENOUS | Status: DC
  Administered 2013-12-27: 23:00:00 via INTRAVENOUS

## 2013-12-27 MED ORDER — SODIUM CHLORIDE 0.9 % IV SOLN
INTRAVENOUS | Status: DC
  Administered 2013-12-27: 3.6 [IU]/h via INTRAVENOUS
  Filled 2013-12-27: qty 2.5

## 2013-12-27 MED ORDER — SODIUM CHLORIDE 0.9 % IV BOLUS (SEPSIS)
1000.0000 mL | Freq: Once | INTRAVENOUS | Status: AC
  Administered 2013-12-27: 1000 mL via INTRAVENOUS

## 2013-12-27 MED ORDER — DEXTROSE 50 % IV SOLN: 25.0000 mL | INTRAVENOUS | Status: DC | PRN

## 2013-12-27 MED ORDER — INSULIN REGULAR BOLUS VIA INFUSION
0.0000 [IU] | Freq: Three times a day (TID) | INTRAVENOUS | Status: DC
  Filled 2013-12-27: qty 10

## 2013-12-27 NOTE — ED Notes (Signed)
Pt reports that his blood sugar has been elevated for some time, pt states he is not taking his medication currently because he does not have a PCP. CBG on arrival is 430. Pt also reports 10/10 generalized pain. Pt reports feeling weak, dizzy, nauseated, polydipsia, polyuria, and dry heaves. Pt is A/O x4, in NAD, and vitals are WDL.

## 2013-12-27 NOTE — ED Notes (Signed)
CBG 415

## 2013-12-27 NOTE — ED Provider Notes (Signed)
CSN: [id number]     Arrival date & time 12/27/13  1958 History   First MD Initiated Contact with Patient 12/27/13 2038     Chief Complaint  Patient presents with  . Hyperglycemia     (Consider location/radiation/quality/duration/timing/severity/associated sxs/prior Treatment) The history is provided by the patient and medical records.    Patient with hx diabetes presents with 3 days of generalized body aches, abdominal pain, N/V/D, lightheadedness, SOB, heart racing, urinary frequency, and increased thirst.  Pt states he has not had a medical doctor and has been out of his insulin for the past 2 years.  Used to be on Lantus and Novolog.  States that whenever he starts feeling bad he borrows some insulin from a friend or family member.  Last insulin dose was novolog, two days ago.  Denies ever having DKA in the past.  Used to be a patient of [facility].  Has not had PCP since it closed. Per records, he is a type 2 diabetic.   Past Medical History  Diagnosis Date  . Diabetes mellitus without complication    History reviewed. No pertinent past surgical history. No family history on file. History  Substance Use Topics  . Smoking status: Current Every Day Smoker -- 0.25 packs/day for 20 years    Types: Cigarettes  . Smokeless tobacco: Never Used  . Alcohol Use: 24.0 oz/week    40 Cans of beer per week    Review of Systems  All other systems reviewed and are negative.     Allergies  Shellfish allergy  Home Medications   Prior to Admission medications   Not on File   BP 133/66  Pulse 87  Temp(Src) 98.1 F (36.7 C) (Oral)  Resp 18  SpO2 100% Physical Exam  Nursing note and vitals reviewed. Constitutional: He appears well-developed and well-nourished. No distress.  HENT:  Head: Normocephalic and atraumatic.  Neck: Neck supple.  Cardiovascular: Normal rate and regular rhythm.   Pulmonary/Chest: Effort normal and breath sounds normal. No respiratory distress. He has no  wheezes. He has no rales.  Abdominal: Soft. He exhibits no distension and no mass. There is generalized tenderness. There is no rebound and no guarding.  Musculoskeletal: Normal range of motion. He exhibits no edema.  Neurological: He is alert. He exhibits normal muscle tone.  Skin: He is not diaphoretic.  Psychiatric: He has a normal mood and affect. His behavior is normal.    ED Course  Procedures (including critical care time) Labs Review Labs Reviewed  COMPREHENSIVE METABOLIC PANEL - Abnormal; Notable for the following:    Sodium 136 (*)    Chloride 91 (*)    Glucose, Bld 472 (*)    Alkaline Phosphatase 127 (*)    GFR calc non Af Amer 89 (*)    Anion gap 26 (*)    All other components within normal limits  URINALYSIS, ROUTINE W REFLEX MICROSCOPIC - Abnormal; Notable for the following:    Specific Gravity, Urine 1.031 (*)    Glucose, UA >1000 (*)    Ketones, ur >80 (*)    All other components within normal limits  BLOOD GAS, ARTERIAL - Abnormal; Notable for the following:    pH, Arterial 7.315 (*)    pCO2 arterial 33.2 (*)    pO2, Arterial 58.3 (*)    Bicarbonate 16.4 (*)    Acid-base deficit 8.4 (*)    All other components within normal limits  CBG MONITORING, ED - Abnormal; Notable for the following:  Glucose-Capillary 430 (*)    All other components within normal limits  CBG MONITORING, ED - Abnormal; Notable for the following:    Glucose-Capillary 415 (*)    All other components within normal limits  MRSA PCR SCREENING  CBC  LIPASE, BLOOD  URINE MICROSCOPIC-ADD ON  BASIC METABOLIC PANEL  BASIC METABOLIC PANEL  BASIC METABOLIC PANEL  BASIC METABOLIC PANEL  URINE RAPID DRUG SCREEN (HOSP PERFORMED)  ETHANOL    Imaging Review Dg Abd Acute W/chest  12/28/2013   CLINICAL DATA:  Chest pain, weakness and dizziness.  EXAM: ACUTE ABDOMEN SERIES (ABDOMEN 2 VIEW & CHEST 1 VIEW)  COMPARISON:  09/22/2010.  FINDINGS: The upright chest x-ray is normal.  Two views of the  abdomen demonstrate scattered air and stool in the colon and scattered small bowel loops with air but no distention. There are few scattered small bowel air-fluid levels. No free air. The soft tissue shadows are maintained. No worrisome calcifications.  IMPRESSION: Normal chest x-ray.  No plain film findings for an acute abdominal process.   Electronically Signed   By: Loralie Champagne M.D.   On: 12/28/2013 00:07     EKG Interpretation None      MDM   Final diagnoses:  Diabetic ketoacidosis without coma associated with type 2 diabetes mellitus    Afebrile nontoxic patient who is uncomfortable appearing, type 2 diabetes, out of medication x 2 years since [facility] closed, used family/friends' medication as it was available.  Two days of generalized weakness and generalized pain, N/V/D.  Found to be in DKA, gap of 28, pH 7.3.  Admitted to [facility], Dr Allena Katz.     [place], PA-C 12/28/13 0022

## 2013-12-27 NOTE — ED Notes (Signed)
Patient reports "strong throbbing sensation" over "entire body", 10/10, x4 days. Patient reports polyuria, N/V.

## 2013-12-28 DIAGNOSIS — F1020 Alcohol dependence, uncomplicated: Secondary | ICD-10-CM | POA: Diagnosis present

## 2013-12-28 DIAGNOSIS — R1013 Epigastric pain: Secondary | ICD-10-CM

## 2013-12-28 DIAGNOSIS — E43 Unspecified severe protein-calorie malnutrition: Secondary | ICD-10-CM | POA: Insufficient documentation

## 2013-12-28 DIAGNOSIS — G629 Polyneuropathy, unspecified: Secondary | ICD-10-CM | POA: Diagnosis present

## 2013-12-28 DIAGNOSIS — F1910 Other psychoactive substance abuse, uncomplicated: Secondary | ICD-10-CM | POA: Diagnosis present

## 2013-12-28 LAB — BASIC METABOLIC PANEL
ANION GAP: 13 (ref 5–15)
Anion gap: 19 — ABNORMAL HIGH (ref 5–15)
Anion gap: 13 (ref 5–15)
Anion gap: 12 (ref 5–15)
Anion gap: 13 (ref 5–15)
BUN: 10 mg/dL (ref 6–23)
BUN: 11 mg/dL (ref 6–23)
BUN: 12 mg/dL (ref 6–23)
BUN: 13 mg/dL (ref 6–23)
BUN: 14 mg/dL (ref 6–23)
CALCIUM: 8.4 mg/dL (ref 8.4–10.5)
CALCIUM: 8.5 mg/dL (ref 8.4–10.5)
CO2: 18 mEq/L — ABNORMAL LOW (ref 19–32)
CO2: 22 mEq/L (ref 19–32)
CO2: 22 meq/L (ref 19–32)
CO2: 23 mEq/L (ref 19–32)
CO2: 23 meq/L (ref 19–32)
CREATININE: 0.81 mg/dL (ref 0.50–1.35)
CREATININE: 0.84 mg/dL (ref 0.50–1.35)
CREATININE: 0.86 mg/dL (ref 0.50–1.35)
CREATININE: 0.9 mg/dL (ref 0.50–1.35)
Calcium: 8.1 mg/dL — ABNORMAL LOW (ref 8.4–10.5)
Calcium: 8.3 mg/dL — ABNORMAL LOW (ref 8.4–10.5)
Calcium: 8.4 mg/dL (ref 8.4–10.5)
Chloride: 101 mEq/L (ref 96–112)
Chloride: 105 mEq/L (ref 96–112)
Chloride: 106 mEq/L (ref 96–112)
Chloride: 106 mEq/L (ref 96–112)
Chloride: 102 mEq/L (ref 96–112)
Creatinine, Ser: 0.8 mg/dL (ref 0.50–1.35)
GFR calc Af Amer: >90 mL/min (ref >90)
GFR calc Af Amer: >90 mL/min (ref >90)
GFR calc Af Amer: >90 mL/min (ref >90)
GFR calc Af Amer: >90 mL/min (ref >90)
GFR calc Af Amer: >90 mL/min (ref >90)
GFR calc non Af Amer: >90 mL/min (ref >90)
GFR calc non Af Amer: >90 mL/min (ref >90)
GFR calc non Af Amer: >90 mL/min (ref >90)
GLUCOSE: 100 mg/dL — AB (ref 70–99)
GLUCOSE: 215 mg/dL — AB (ref 70–99)
GLUCOSE: 304 mg/dL — AB (ref 70–99)
Glucose, Bld: 121 mg/dL — ABNORMAL HIGH (ref 70–99)
Glucose, Bld: 213 mg/dL — ABNORMAL HIGH (ref 70–99)
Potassium: 4.1 mEq/L (ref 3.7–5.3)
Potassium: 4.1 mEq/L (ref 3.7–5.3)
Potassium: 3.6 mEq/L — ABNORMAL LOW (ref 3.7–5.3)
Potassium: 3.6 mEq/L — ABNORMAL LOW (ref 3.7–5.3)
Potassium: 4.1 mEq/L (ref 3.7–5.3)
SODIUM: 141 meq/L (ref 137–147)
SODIUM: 141 meq/L (ref 137–147)
SODIUM: 138 meq/L (ref 137–147)
Sodium: 138 mEq/L (ref 137–147)
Sodium: 140 mEq/L (ref 137–147)

## 2013-12-28 LAB — CBC WITH DIFFERENTIAL/PLATELET
Basophils Absolute: 0 10*3/uL (ref 0.0–0.1)
Basophils Relative: 0 % (ref 0–1)
Eosinophils Absolute: 0.1 10*3/uL (ref 0.0–0.7)
Eosinophils Relative: 1 % (ref 0–5)
HCT: 36 % — ABNORMAL LOW (ref 39.0–52.0)
HEMOGLOBIN: 12.9 g/dL — AB (ref 13.0–17.0)
LYMPHS PCT: 37 % (ref 12–46)
Lymphs Abs: 3.4 10*3/uL (ref 0.7–4.0)
MCH: 32.2 pg (ref 26.0–34.0)
MCHC: 35.8 g/dL (ref 30.0–36.0)
MCV: 89.8 fL (ref 78.0–100.0)
MONO ABS: 1.1 10*3/uL — AB (ref 0.1–1.0)
MONOS PCT: 12 % (ref 3–12)
NEUTROS ABS: 4.5 10*3/uL (ref 1.7–7.7)
Neutrophils Relative %: 50 % (ref 43–77)
Platelets: 218 10*3/uL (ref 150–400)
RBC: 4.01 MIL/uL — ABNORMAL LOW (ref 4.22–5.81)
RDW: 12.6 % (ref 11.5–15.5)
WBC: 9 10*3/uL (ref 4.0–10.5)

## 2013-12-28 LAB — COMPREHENSIVE METABOLIC PANEL
ALT: 26 U/L (ref 0–53)
ANION GAP: 13 (ref 5–15)
AST: 20 U/L (ref 0–37)
Albumin: 3.4 g/dL — ABNORMAL LOW (ref 3.5–5.2)
Alkaline Phosphatase: 86 U/L (ref 39–117)
BILIRUBIN TOTAL: 0.4 mg/dL (ref 0.3–1.2)
BUN: 11 mg/dL (ref 6–23)
CHLORIDE: 106 meq/L (ref 96–112)
CO2: 22 meq/L (ref 19–32)
CREATININE: 0.83 mg/dL (ref 0.50–1.35)
Calcium: 8.3 mg/dL — ABNORMAL LOW (ref 8.4–10.5)
GFR calc Af Amer: >90 mL/min (ref >90)
Glucose, Bld: 120 mg/dL — ABNORMAL HIGH (ref 70–99)
Potassium: 3.6 mEq/L — ABNORMAL LOW (ref 3.7–5.3)
Sodium: 141 mEq/L (ref 137–147)
Total Protein: 5.9 g/dL — ABNORMAL LOW (ref 6.0–8.3)

## 2013-12-28 LAB — RAPID URINE DRUG SCREEN, HOSP PERFORMED
Amphetamines: NOT DETECTED
Barbiturates: NOT DETECTED
Benzodiazepines: NOT DETECTED
Cocaine: POSITIVE — AB
OPIATES: POSITIVE — AB
Tetrahydrocannabinol: POSITIVE — AB

## 2013-12-28 LAB — GLUCOSE, CAPILLARY
GLUCOSE-CAPILLARY: 127 mg/dL — AB (ref 70–99)
GLUCOSE-CAPILLARY: 307 mg/dL — AB (ref 70–99)
GLUCOSE-CAPILLARY: 91 mg/dL (ref 70–99)
Glucose-Capillary: 249 mg/dL — ABNORMAL HIGH (ref 70–99)
Glucose-Capillary: 211 mg/dL — ABNORMAL HIGH (ref 70–99)
Glucose-Capillary: 174 mg/dL — ABNORMAL HIGH (ref 70–99)
Glucose-Capillary: 94 mg/dL (ref 70–99)
Glucose-Capillary: 94 mg/dL (ref 70–99)
Glucose-Capillary: 209 mg/dL — ABNORMAL HIGH (ref 70–99)
Glucose-Capillary: 251 mg/dL — ABNORMAL HIGH (ref 70–99)
Glucose-Capillary: 193 mg/dL — ABNORMAL HIGH (ref 70–99)

## 2013-12-28 LAB — HEMOGLOBIN A1C
HEMOGLOBIN A1C: 11.2 % — AB (ref <5.7)
Mean Plasma Glucose: 275 mg/dL — ABNORMAL HIGH (ref <117)

## 2013-12-28 LAB — ETHANOL: Alcohol, Ethyl (B): <11 mg/dL (ref 0–11)

## 2013-12-28 LAB — MRSA PCR SCREENING: MRSA by PCR: NEGATIVE

## 2013-12-28 MED ORDER — SODIUM CHLORIDE 0.9 % IV SOLN
INTRAVENOUS | Status: DC
  Administered 2013-12-28: 01:00:00 via INTRAVENOUS

## 2013-12-28 MED ORDER — INSULIN ASPART 100 UNIT/ML ~~LOC~~ SOLN
0.0000 [IU] | Freq: Every day | SUBCUTANEOUS | Status: DC
  Administered 2013-12-29: 3 [IU] via SUBCUTANEOUS

## 2013-12-28 MED ORDER — DEXTROSE-NACL 5-0.45 % IV SOLN
INTRAVENOUS | Status: DC
  Administered 2013-12-28: 02:00:00 via INTRAVENOUS

## 2013-12-28 MED ORDER — FOLIC ACID 1 MG PO TABS
1.0000 mg | ORAL_TABLET | Freq: Every day | ORAL | Status: DC
  Administered 2013-12-28 – 2013-12-30 (×3): 1 mg via ORAL
  Filled 2013-12-28 (×3): qty 1

## 2013-12-28 MED ORDER — ONDANSETRON 8 MG/NS 50 ML IVPB
8.0000 mg | Freq: Four times a day (QID) | INTRAVENOUS | Status: DC | PRN
  Filled 2013-12-28: qty 8

## 2013-12-28 MED ORDER — LORAZEPAM 1 MG PO TABS: 0.0000 mg | ORAL_TABLET | Freq: Four times a day (QID) | ORAL | Status: AC

## 2013-12-28 MED ORDER — INSULIN ASPART 100 UNIT/ML ~~LOC~~ SOLN
0.0000 [IU] | Freq: Three times a day (TID) | SUBCUTANEOUS | Status: DC
  Administered 2013-12-28: 5 [IU] via SUBCUTANEOUS
  Administered 2013-12-28: 8 [IU] via SUBCUTANEOUS
  Administered 2013-12-29: 11 [IU] via SUBCUTANEOUS
  Administered 2013-12-29: 2 [IU] via SUBCUTANEOUS
  Administered 2013-12-29: 15 [IU] via SUBCUTANEOUS

## 2013-12-28 MED ORDER — PNEUMOCOCCAL VAC POLYVALENT 25 MCG/0.5ML IJ INJ
0.5000 mL | INJECTION | INTRAMUSCULAR | Status: AC
  Administered 2013-12-29: 0.5 mL via INTRAMUSCULAR
  Filled 2013-12-28 (×2): qty 0.5

## 2013-12-28 MED ORDER — OXYCODONE HCL 5 MG PO TABS
5.0000 mg | ORAL_TABLET | ORAL | Status: DC | PRN
  Administered 2013-12-28: 5 mg via ORAL
  Filled 2013-12-28: qty 1

## 2013-12-28 MED ORDER — SODIUM CHLORIDE 0.9 % IV SOLN
INTRAVENOUS | Status: DC
  Filled 2013-12-28: qty 2.5

## 2013-12-28 MED ORDER — DEXTROSE 50 % IV SOLN: 25.0000 mL | INTRAVENOUS | Status: DC | PRN

## 2013-12-28 MED ORDER — PANTOPRAZOLE SODIUM 40 MG PO TBEC
40.0000 mg | DELAYED_RELEASE_TABLET | Freq: Every day | ORAL | Status: DC
  Administered 2013-12-28 – 2013-12-30 (×4): 40 mg via ORAL
  Filled 2013-12-28 (×4): qty 1

## 2013-12-28 MED ORDER — POTASSIUM CHLORIDE 10 MEQ/100ML IV SOLN
10.0000 meq | INTRAVENOUS | Status: AC
  Administered 2013-12-28 (×2): 10 meq via INTRAVENOUS
  Filled 2013-12-28: qty 100

## 2013-12-28 MED ORDER — INSULIN GLARGINE 100 UNIT/ML ~~LOC~~ SOLN
12.0000 [IU] | Freq: Every day | SUBCUTANEOUS | Status: DC
  Administered 2013-12-28: 12 [IU] via SUBCUTANEOUS
  Filled 2013-12-28 (×2): qty 0.12

## 2013-12-28 MED ORDER — ENOXAPARIN SODIUM 40 MG/0.4ML ~~LOC~~ SOLN
40.0000 mg | SUBCUTANEOUS | Status: DC
  Administered 2013-12-28 – 2013-12-29 (×2): 40 mg via SUBCUTANEOUS
  Filled 2013-12-28 (×3): qty 0.4

## 2013-12-28 MED ORDER — VITAMIN B-1 100 MG PO TABS
100.0000 mg | ORAL_TABLET | Freq: Every day | ORAL | Status: DC
  Administered 2013-12-28 – 2013-12-30 (×3): 100 mg via ORAL
  Filled 2013-12-28 (×3): qty 1

## 2013-12-28 MED ORDER — ACETAMINOPHEN 325 MG PO TABS
650.0000 mg | ORAL_TABLET | Freq: Four times a day (QID) | ORAL | Status: DC | PRN
  Administered 2013-12-28 – 2013-12-29 (×2): 650 mg via ORAL
  Filled 2013-12-28 (×2): qty 2

## 2013-12-28 MED ORDER — LORAZEPAM 1 MG PO TABS: 0.0000 mg | ORAL_TABLET | Freq: Two times a day (BID) | ORAL | Status: DC

## 2013-12-28 MED ORDER — NICOTINE 21 MG/24HR TD PT24
21.0000 mg | MEDICATED_PATCH | Freq: Every day | TRANSDERMAL | Status: DC
  Administered 2013-12-28 – 2013-12-30 (×3): 21 mg via TRANSDERMAL
  Filled 2013-12-28 (×3): qty 1

## 2013-12-28 MED ORDER — ADULT MULTIVITAMIN W/MINERALS CH
1.0000 | ORAL_TABLET | Freq: Every day | ORAL | Status: DC
  Administered 2013-12-28 – 2013-12-30 (×3): 1 via ORAL
  Filled 2013-12-28 (×3): qty 1

## 2013-12-28 MED ORDER — THIAMINE HCL 100 MG/ML IJ SOLN: 100.0000 mg | Freq: Every day | INTRAMUSCULAR | Status: DC

## 2013-12-28 NOTE — ED Provider Notes (Signed)
Medical screening examination/treatment/procedure(s) were conducted as a shared visit with non-physician practitioner(s) and myself.  I personally evaluated the patient during the encounter.  Pt presented to the ED with hyperglycemia , nausea and vomiting.  Increased anion gap and decreased bicarb.  Concerning for early DKA.  Linwood Dibbles, MD 12/28/13 [phone]

## 2013-12-28 NOTE — Progress Notes (Addendum)
INITIAL NUTRITION ASSESSMENT  DOCUMENTATION CODES Per approved criteria  -Severe malnutrition in the context of social or environmental circumstances   INTERVENTION: Pt declined any education at this time. Reports that he has understanding of what to do, but lacks the resources. Agreeable with Child psychotherapist consult. Consider addition of Glucerna Shakes prn between meals to help with weight gain. RD to continue to follow nutrition care plan.  NUTRITION DIAGNOSIS: Inadequate oral intake related to acute illness and social hx as evidenced by poor po intake and weight loss.  Goal: Intake to meet >90% of estimated nutrition needs.  Monitor:  weight trends, lab trends, I/O's, PO intake, supplement tolerance  Reason for Assessment: Malnutrition Screening Tool  38 y.o. male  Admitting Dx: DKA (diabetic ketoacidoses)  ASSESSMENT: PMHx significant for DM. Pt reports to me that he has been DM since 2001. Admitted with abdominal pain, body aches and lightheadedness x 3 days. Work-up reveals DKA.  Pt reported on admission that he drinks 24 oz of ETOH every other day. He reports that he uses marijuana and cocaine about 5 times per week.  RD helped pt set-up breakfast. He reports that this is his first meal since he's been in the hospital. Feels hungry and suspects that he will eat well while here. He reports that he usually weighs around 140-145 lb and weighed this during the summer. He said that he worked at [employer] that had a scale and he would weigh himself periodically. 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 Also suspect illicit drug use and ETOH abuse is contributing to weight loss as well.  Brief physical exam reveals that pt has severe muscle wasting in lower extremities.  Pt meets criteria for severe MALNUTRITION in the context of social-environmental  circumstances as evidenced by 10% wt loss x <3 months, severe muscle mass depletion in lower extremities, and suspected intake of <50% x at least 1 month.   CBG's now WNL Potassium low at 3.6  Height: Ht Readings from Last 1 Encounters:  12/28/13  (1.727 m)    Weight: Wt Readings from Last 1 Encounters:  12/28/13 128 lb 1.4 oz (58.1 kg)    Ideal Body Weight: 154 lb  % Ideal Body Weight: 83%  Wt Readings from Last 10 Encounters:  12/28/13 128 lb 1.4 oz (58.1 kg)    Usual Body Weight: 140 - 145 lb   % Usual Body Weight: 90%  BMI:  Body mass index is 19.48 kg/(m^2). WNL  Estimated Nutritional Needs: Kcal: 1800 - 2000 Protein: 70 - 85 g Fluid: 1.8 - 2 liters daily  Skin: intact  Diet Order: Carb Control  EDUCATION NEEDS: -Education needs addressed   Intake/Output Summary (Last 24 hours) at 12/28/13 0819 Last data filed at 12/28/13 0700  Gross per 24 hour  Intake 626.92 ml  Output    350 ml  Net 276.92 ml    Last BM: 9/4  Labs:   Recent Labs Lab 12/28/13 0057 12/28/13 0300 12/28/13 0518  NA 138 140 141  141  K 4.1 4.1 3.6*  3.6*  CL 101 105 106  106  CO2 18* BUN CREATININE 0.90 0.86 0.84  0.83  CALCIUM 8.4 8.5 8.1*  8.3*  GLUCOSE 304* 215* 121*  120*  CBG (last 3)   Recent Labs  12/28/13 0517 12/28/13 0605 12/28/13 0650  GLUCAP 127* 91 94     Scheduled Meds: . enoxaparin (LOVENOX) injection  40 mg Subcutaneous Q24H  . folic acid  1 mg Oral Daily  . insulin aspart  0-15 Units Subcutaneous TID WC  . insulin aspart  0-5 Units Subcutaneous QHS  . insulin glargine  12 Units Subcutaneous Daily  . LORazepam  0-4 mg Oral Q6H   Followed by  . [START ON 12/30/2013] LORazepam  0-4 mg Oral Q12H  . multivitamin with minerals  1 tablet Oral Daily  . nicotine  21 mg Transdermal Daily  . pantoprazole  40 mg Oral Daily  . [START ON 12/29/2013] pneumococcal 23 valent vaccine  0.5 mL Intramuscular  Tomorrow-1000  . thiamine  100 mg Oral Daily   Or  . thiamine  100 mg Intravenous Daily    Continuous Infusions:   Past Medical History  Diagnosis Date  . Diabetes mellitus without complication     History reviewed. No pertinent past surgical history.  Jarold Motto MS, RD, LDN Inpatient Registered Dietitian Pager: [phone] After-hours pager: [phone]

## 2013-12-28 NOTE — Progress Notes (Signed)
[facility] PROGRESS NOTE  [name] BMS:[id number] DOB: 17-Jun-1975 DOA: 12/27/2013 PCP: No PCP Per Patient  Assessment/Plan  DKA (diabetic ketoacidoses), resolving.  Gap is closed and bicarb wnl.  Transitioning to subcut insulin -  Lantus 12 units + SSI ordered by overnight MD -  Check BMP four afters after transition -  IVF off at time that insulin gtt turned off -  Transfer from stepdown to floor okay if not reopening gap at next BMP -  Teaching by RN staff -  Diabetes videos -  No insurance:  CM consultation for possible medication assistance -  Will plan on using 70/30 insulin at discharge -  A1c  Abdominal pain, likely due to DKA -  UA, LFTs, lipase, KUB wnl -  PPI -  GI cocktail prn  Polysubstance abuse:  Opiates, Cocaine, THC positive.  Admits to EtOH use -  CIWA -  Thiamine, folate, MVI -  SW consult  Tobacco dependence with withdrawal -  Nicotine patch -  Counseled cessation -  RN to provide additional education  Diet:  diabetic Access:  PIV IVF:  Off when insulin gtt off Proph:  lovenox  Code Status: full Family Communication: patient alone Disposition Plan:  Transfer to med-surg if noon labs show gap not reopening.   Consultants:  None  Procedures:  KUB  Antibiotics:  none   HPI/Subjective:  Persistent but improving abdominal pain.  Denies blurry vision, nausea.    Objective: Filed Vitals:   12/28/13 0114 12/28/13 0200 12/28/13 0500 12/28/13 0600  BP: 123/78 126/106 139/68 113/69  Pulse: 90 83 76 68  Temp:   97.8 F (36.6 C)   TempSrc:   Oral   Resp: 17 13 12 17   Height:      Weight:      SpO2: 100% 100% 100% 100%   No intake or output data in the 24 hours ending 12/28/13 0735 Filed Weights   12/28/13 0100  Weight: 58.1 kg (128 lb 1.4 oz)    Exam:   General:  Thin BM, No acute distress  HEENT:  NCAT, MMM  Cardiovascular:  RRR, nl S1, S2 no mrg, 2+ pulses, warm extremities  Respiratory:  CTAB, no increased  WOB  Abdomen:   NABS, soft, minimal TTP in epigastric and LUQ without rebound or guarding, ND  MSK:   Normal tone and bulk, no LEE  Neuro:  Grossly intact  Data Reviewed: Basic Metabolic Panel:  Recent Labs Lab 12/27/13 2126 12/28/13 0057 12/28/13 0300 12/28/13 0518  NA 136* 138 140 141  141  K 4.9 4.1 4.1 3.6*  3.6*  CL 91* 101 105 106  106  CO2 19 18* 22 23  22   GLUCOSE 472* 304* 215* 121*  120*  BUN 16 14 13 12  11   CREATININE 1.03 0.90 0.86 0.84  0.83  CALCIUM 9.4 8.4 8.5 8.1*  8.3*   Liver Function Tests:  Recent Labs Lab 12/27/13 2126 12/28/13 0518  AST 29 20  ALT 37 26  ALKPHOS 127* 86  BILITOT 0.5 0.4  PROT 7.9 5.9*  ALBUMIN 4.5 3.4*    Recent Labs Lab 12/27/13 2126  LIPASE 15   No results found for this basename: AMMONIA,  in the last 168 hours CBC:  Recent Labs Lab 12/27/13 2126 12/28/13 0518  WBC 10.5 9.0  NEUTROABS  --  4.5  HGB 15.4 12.9*  HCT 44.6 36.0*  MCV 92.5 89.8  PLT 257 218   Cardiac Enzymes: No results found  for this basename: CKTOTAL, CKMB, CKMBINDEX, TROPONINI,  in the last 168 hours BNP (last 3 results) No results found for this basename: PROBNP,  in the last 8760 hours CBG:  Recent Labs Lab 12/28/13 0259 12/28/13 0407 12/28/13 0517 12/28/13 0605 12/28/13 0650  GLUCAP 211* 174* 127* 91 94    Recent Results (from the past 240 hour(s))  MRSA PCR SCREENING     Status: None   Collection Time    12/28/13  1:15 AM      Result Value Ref Range Status   MRSA by PCR NEGATIVE  NEGATIVE Final   Comment:            The GeneXpert MRSA Assay (FDA     approved for NASAL specimens     only), is one component of a     comprehensive MRSA colonization     surveillance program. It is not     intended to diagnose MRSA     infection nor to guide or     monitor treatment for     MRSA infections.     Studies: Dg Abd Acute W/chest  12/28/2013   CLINICAL DATA:  Chest pain, weakness and dizziness.  EXAM: ACUTE ABDOMEN  SERIES (ABDOMEN 2 VIEW & CHEST 1 VIEW)  COMPARISON:  09/22/2010.  FINDINGS: The upright chest x-ray is normal.  Two views of the abdomen demonstrate scattered air and stool in the colon and scattered small bowel loops with air but no distention. There are few scattered small bowel air-fluid levels. No free air. The soft tissue shadows are maintained. No worrisome calcifications.  IMPRESSION: Normal chest x-ray.  No plain film findings for an acute abdominal process.   Electronically Signed   By: Loralie Champagne M.D.   On: 12/28/2013 00:07    Scheduled Meds: . enoxaparin (LOVENOX) injection  40 mg Subcutaneous Q24H  . insulin aspart  0-15 Units Subcutaneous TID WC  . insulin aspart  0-5 Units Subcutaneous QHS  . insulin glargine  12 Units Subcutaneous Daily  . pantoprazole  40 mg Oral Daily   Continuous Infusions: . sodium chloride 100 mL/hr at 12/28/13 0052  . dextrose 5 % and 0.45% NaCl 100 mL/hr at 12/28/13 0204    Principal Problem:   DKA (diabetic ketoacidoses) Active Problems:   Neuropathy   Substance abuse   Alcohol dependence    Time spent: 30 min    Jamani Eley, [facility]  [facility] Pager [phone]. If 7PM-7AM, please contact night-coverage at [email or web address], password [facility] 12/28/2013, 7:35 AM  LOS: 1 day

## 2013-12-28 NOTE — H&P (Signed)
[facility] History and Physical  Patient: [name]  IOM:[id number]  DOB: [birth year]  DOS: the patient was seen and examined on 12/27/2013 PCP: No PCP Per Patient  Chief Complaint: Abdominal pain with nausea and vomiting  HPI: [name] is a 38 y.o. male with Past medical history of diabetes mellitus. The patient presented with complaints of abdominal pain that has been ongoing since last 3 days along with generalized body ache and lightheadedness. He also mentions he has increasing urinary frequency and increased thirst. He denies any burning when he stated he denies any fever or chills. He complains of crampy throbbing burning abdominal pain which is present since last 3 days progressively worsening. He denies any diarrhea or constipation but he did have a large bowel movement today without any blood in it He mentions in the past he was on Lantus and NovoLog but since last 2 years has not been taking the Lantus on a regular basis and uses his friends or family members Lantus as and when needed. He mentions he drinks alcohol every other day to be her 24 ounces. He also mentions uses marijuana every now and then. He mentions he smoke cigarettes 2 cigarettes a day to half pack a day.  The patient is coming from home. And at his baseline independent for most of his ADL.  Review of Systems: as mentioned in the history of present illness.  A Comprehensive review of the other systems is negative.  Past Medical History  Diagnosis Date  . Diabetes mellitus without complication    History reviewed. No pertinent past surgical history. Social History:  reports that he has been smoking Cigarettes.  He has a 5 pack-year smoking history. He has never used smokeless tobacco. He reports that he drinks about 24 ounces of alcohol per week. He reports that he uses illicit drugs (Marijuana and Cocaine) about 5 times per week.  Allergies  Allergen Reactions  . Shellfish Allergy Anaphylaxis     No family history on file.  Prior to Admission medications   Not on File    Physical Exam: Filed Vitals:   12/28/13 0004 12/28/13 0030 12/28/13 0100 12/28/13 0114  BP: 134/69 140/93  123/78  Pulse: 84 75  90  Temp:  97.8 F (36.6 C)    TempSrc:  Oral    Resp:  15  17  Height:   5\' 8"  (1.727 m)   Weight:   58.1 kg (128 lb 1.4 oz)   SpO2: 100% 99%  100%    General: Alert, Awake and Oriented to Time, Place and Person. Appear in mild distress Eyes: PERRL ENT: Oral Mucosa clear dry. Neck: No JVD Cardiovascular: S1 and S2 Present, no Murmur, Peripheral Pulses Present Respiratory: Bilateral Air entry equal and Decreased, Clear to Auscultation, no Crackles, no wheezes Abdomen: Bowel Sound Present, Soft and mild diffuse tender Skin: No Rash Extremities: No Pedal edema, no calf tenderness Neurologic: Grossly no focal neuro deficit.  Labs on Admission:  CBC:  Recent Labs Lab 12/27/13 2126  WBC 10.5  HGB 15.4  HCT 44.6  MCV 92.5  PLT 257    CMP     Component Value Date/Time   NA 138 12/28/2013 0057   K 4.1 12/28/2013 0057   CL 101 12/28/2013 0057   CO2 18* 12/28/2013 0057   GLUCOSE 304* 12/28/2013 0057   BUN 14 12/28/2013 0057   CREATININE 0.90 12/28/2013 0057   CALCIUM 8.4 12/28/2013 0057   PROT 7.9 12/27/2013 2126  ALBUMIN 4.5 12/27/2013 2126   AST 29 12/27/2013 2126   ALT 37 12/27/2013 2126   ALKPHOS 127* 12/27/2013 2126   BILITOT 0.5 12/27/2013 2126   GFRNONAA >90 12/28/2013 0057   GFRAA >90 12/28/2013 0057     Recent Labs Lab 12/27/13 2126  LIPASE 15   No results found for this basename: AMMONIA,  in the last 168 hours  No results found for this basename: CKTOTAL, CKMB, CKMBINDEX, TROPONINI,  in the last 168 hours BNP (last 3 results) No results found for this basename: PROBNP,  in the last 8760 hours  Radiological Exams on Admission: Dg Abd Acute W/chest  12/28/2013   CLINICAL DATA:  Chest pain, weakness and dizziness.  EXAM: ACUTE ABDOMEN SERIES  (ABDOMEN 2 VIEW & CHEST 1 VIEW)  COMPARISON:  09/22/2010.  FINDINGS: The upright chest x-ray is normal.  Two views of the abdomen demonstrate scattered air and stool in the colon and scattered small bowel loops with air but no distention. There are few scattered small bowel air-fluid levels. No free air. The soft tissue shadows are maintained. No worrisome calcifications.  IMPRESSION: Normal chest x-ray.  No plain film findings for an acute abdominal process.   Electronically Signed   By: Loralie Champagne M.D.   On: 12/28/2013 00:07    Assessment/Plan Principal Problem:   DKA (diabetic ketoacidoses) Active Problems:   Neuropathy   Substance abuse   Alcohol dependence   1. DKA (diabetic ketoacidoses) The patient is presenting because of abdominal pain nausea vomiting polyuria and thirst. His son to have anion gap with elevated blood glucose. Is not taking any insulin on a regular basis. With this the patient appears to have DKA and will be admitted to step down unit. I will give him IV hydration and IV insulin drip. Monitor BMP every 2 hours. Check urine drug screen. Check alcohol level. Monitor for withdrawal. Probable etiology is combination of substance abuse, possible gastroenteritis, noncompliance with insulin regimen.  2. Abdominal pain. Check x-ray. That he has a negative. Lipase negative.  3. Substance abuse Monitor for withdrawal at present. Patient was counseled against abuse at present he would like to proceed on his own to stop abusing marijuana and alcohol.  Consults: Patient will need a Child psychotherapist and case management consult for arrangement of her PCP as well as insulin  DVT Prophylaxis: subcutaneous Heparin Nutrition: N.p.o. except medication  Code Status: Full  Disposition: Admitted to inpatient in step-down unit.  Author: Lynden Oxford, MD [facility] Pager: [phone] 12/28/2013, 2:27 AM    If 7PM-7AM, please contact  night-coverage [email or web address] Password [email or web address]  **Disclaimer: This note may have been dictated with voice recognition software. Similar sounding words can inadvertently be transcribed and this note may contain transcription errors which may not have been corrected upon publication of note.**

## 2013-12-28 NOTE — Progress Notes (Signed)
Patient transferred to 1333, report given to the nurse [facility]. Patient alert and oriented denies any pain/distress, skin intact, no wound noted.

## 2013-12-29 LAB — GLUCOSE, CAPILLARY
GLUCOSE-CAPILLARY: 121 mg/dL — AB (ref 70–99)
Glucose-Capillary: 312 mg/dL — ABNORMAL HIGH (ref 70–99)
Glucose-Capillary: 244 mg/dL — ABNORMAL HIGH (ref 70–99)
Glucose-Capillary: 431 mg/dL — ABNORMAL HIGH (ref 70–99)
Glucose-Capillary: 254 mg/dL — ABNORMAL HIGH (ref 70–99)

## 2013-12-29 LAB — BASIC METABOLIC PANEL
Anion gap: 11 (ref 5–15)
BUN: 11 mg/dL (ref 6–23)
CALCIUM: 8.8 mg/dL (ref 8.4–10.5)
CO2: 27 meq/L (ref 19–32)
CREATININE: 0.79 mg/dL (ref 0.50–1.35)
Chloride: 100 mEq/L (ref 96–112)
GFR calc Af Amer: >90 mL/min (ref >90)
GFR calc non Af Amer: >90 mL/min (ref >90)
GLUCOSE: 284 mg/dL — AB (ref 70–99)
Potassium: 4.1 mEq/L (ref 3.7–5.3)
Sodium: 138 mEq/L (ref 137–147)

## 2013-12-29 LAB — CBC
HCT: 39.3 % (ref 39.0–52.0)
HEMOGLOBIN: 13.8 g/dL (ref 13.0–17.0)
MCH: 31.6 pg (ref 26.0–34.0)
MCHC: 35.1 g/dL (ref 30.0–36.0)
MCV: 89.9 fL (ref 78.0–100.0)
Platelets: 194 10*3/uL (ref 150–400)
RBC: 4.37 MIL/uL (ref 4.22–5.81)
RDW: 12.6 % (ref 11.5–15.5)
WBC: 8 10*3/uL (ref 4.0–10.5)

## 2013-12-29 MED ORDER — INSULIN GLARGINE 100 UNIT/ML ~~LOC~~ SOLN
25.0000 [IU] | Freq: Every day | SUBCUTANEOUS | Status: DC
  Administered 2013-12-29: 25 [IU] via SUBCUTANEOUS
  Filled 2013-12-29 (×3): qty 0.25

## 2013-12-29 MED ORDER — INSULIN ASPART 100 UNIT/ML ~~LOC~~ SOLN
5.0000 [IU] | Freq: Three times a day (TID) | SUBCUTANEOUS | Status: DC
  Administered 2013-12-29 (×3): 5 [IU] via SUBCUTANEOUS

## 2013-12-29 NOTE — Progress Notes (Signed)
[facility] PROGRESS NOTE  [name] DYJ:[id number] DOB: 1975-08-28 DOA: 12/27/2013 PCP: No PCP Per Patient  Assessment/Plan  DKA (diabetic ketoacidoses), resolved but CBG elevated.  Increase TDI by 25% which is approximately 50 units.   -  Increase lantus to 25 units -  Add aspart 5 units with meals in addition to SSI -  Teaching by RN staff -  Diabetes videos -  A1c 11  No insurance:  CM consultation for possible medication assistance - will hopefully be able to go through [facility].    Abdominal pain, likely due to DKA -  UA, LFTs, lipase, KUB wnl -  PPI -  GI cocktail prn  Polysubstance abuse:  Opiates, Cocaine, THC positive.  Admits to EtOH use.  No evidence of withdrawal -  CIWA -  Thiamine, folate, MVI -  SW consult  Tobacco dependence with withdrawal -  Nicotine patch -  Counseled cessation -  RN to provide additional education  Diet:  diabetic Access:  PIV IVF:  Off when insulin gtt off Proph:  lovenox  Code Status: full Family Communication: patient alone Disposition Plan:  Likely discharge tomorrow if CBG better controlled.     Consultants:  None  Procedures:  KUB  Antibiotics:  none   HPI/Subjective:  Abdominal pain resolved after BM.  Has a mild headache.    Objective: Filed Vitals:   12/28/13 1700 12/28/13 1855 12/29/13 0215 12/29/13 0645  BP: 127/81 111/85 124/77 106/68  Pulse: 80 100 79 86  Temp:  98.4 F (36.9 C) 98.3 F (36.8 C) 97.8 F (36.6 C)  TempSrc:  Oral Oral Oral  Resp: 12 16 16 16   Height:  5\' 8"  (1.727 m)    Weight:  58.06 kg (128 lb)    SpO2:  94% 100% 100%    Intake/Output Summary (Last 24 hours) at 12/29/13 0817 Last data filed at 12/29/13 0646  Gross per 24 hour  Intake    960 ml  Output   1000 ml  Net    -40 ml   Filed Weights   12/28/13 0100 12/28/13 1855  Weight: 58.1 kg (128 lb 1.4 oz) 58.06 kg (128 lb)    Exam:   General:  Thin BM, No acute distress  HEENT:   NCAT, MMM  Cardiovascular:  RRR, nl S1, S2 no mrg, 2+ pulses, warm extremities  Respiratory:  CTAB, no increased WOB  Abdomen:   NABS, soft, NT/D  MSK:   Normal tone and bulk, no LEE  Neuro:  Grossly intact  Data Reviewed: Basic Metabolic Panel:  Recent Labs Lab 12/28/13 0300 12/28/13 0518 12/28/13 0720 12/28/13 1200 12/29/13 0405  NA 140 141  141 141 138 138  K 4.1 3.6*  3.6* 3.6* 4.1 4.1  CL 105 106  106 106 102 100  CO2 22 23  22 22 23 27   GLUCOSE 215* 121*  120* 100* 213* 284*  BUN 13 12  11 11 10 11   CREATININE 0.86 0.84  0.83 0.80 0.81 0.79  CALCIUM 8.5 8.1*  8.3* 8.3* 8.4 8.8   Liver Function Tests:  Recent Labs Lab 12/27/13 2126 12/28/13 0518  AST 29 20  ALT 37 26  ALKPHOS 127* 86  BILITOT 0.5 0.4  PROT 7.9 5.9*  ALBUMIN 4.5 3.4*    Recent Labs Lab 12/27/13 2126  LIPASE 15   No results found for this basename: AMMONIA,  in the last 168 hours CBC:  Recent Labs Lab 12/27/13 2126 12/28/13 0518  12/29/13 0405  WBC 10.5 9.0 8.0  NEUTROABS  --  4.5  --   HGB 15.4 12.9* 13.8  HCT 44.6 36.0* 39.3  MCV 92.5 89.8 89.9  PLT 257 218 194   Cardiac Enzymes: No results found for this basename: CKTOTAL, CKMB, CKMBINDEX, TROPONINI,  in the last 168 hours BNP (last 3 results) No results found for this basename: PROBNP,  in the last 8760 hours CBG:  Recent Labs Lab 12/28/13 0650 12/28/13 0746 12/28/13 1236 12/28/13 1638 12/28/13 2058  GLUCAP 94 94 209* 251* 193*    Recent Results (from the past 240 hour(s))  MRSA PCR SCREENING     Status: None   Collection Time    12/28/13  1:15 AM      Result Value Ref Range Status   MRSA by PCR NEGATIVE  NEGATIVE Final   Comment:            The GeneXpert MRSA Assay (FDA     approved for NASAL specimens     only), is one component of a     comprehensive MRSA colonization     surveillance program. It is not     intended to diagnose MRSA     infection nor to guide or     monitor treatment for      MRSA infections.     Studies: Dg Abd Acute W/chest  12/28/2013   CLINICAL DATA:  Chest pain, weakness and dizziness.  EXAM: ACUTE ABDOMEN SERIES (ABDOMEN 2 VIEW & CHEST 1 VIEW)  COMPARISON:  09/22/2010.  FINDINGS: The upright chest x-ray is normal.  Two views of the abdomen demonstrate scattered air and stool in the colon and scattered small bowel loops with air but no distention. There are few scattered small bowel air-fluid levels. No free air. The soft tissue shadows are maintained. No worrisome calcifications.  IMPRESSION: Normal chest x-ray.  No plain film findings for an acute abdominal process.   Electronically Signed   By: Loralie Champagne M.D.   On: 12/28/2013 00:07    Scheduled Meds: . enoxaparin (LOVENOX) injection  40 mg Subcutaneous Q24H  . folic acid  1 mg Oral Daily  . insulin aspart  0-15 Units Subcutaneous TID WC  . insulin aspart  0-5 Units Subcutaneous QHS  . insulin aspart  5 Units Subcutaneous TID WC  . insulin glargine  25 Units Subcutaneous Daily  . LORazepam  0-4 mg Oral Q6H   Followed by  . [START ON 12/30/2013] LORazepam  0-4 mg Oral Q12H  . multivitamin with minerals  1 tablet Oral Daily  . nicotine  21 mg Transdermal Daily  . pantoprazole  40 mg Oral Daily  . pneumococcal 23 valent vaccine  0.5 mL Intramuscular Tomorrow-1000  . thiamine  100 mg Oral Daily   Continuous Infusions:    Principal Problem:   DKA (diabetic ketoacidoses) Active Problems:   Neuropathy   Substance abuse   Alcohol dependence   Abdominal pain, epigastric   Protein-calorie malnutrition, severe    Time spent: 30 min    Benjimin Hadden, [facility]  [facility] Pager [phone]. If 7PM-7AM, please contact night-coverage at [email or web address], password [facility] 12/29/2013, 8:17 AM  LOS: 2 days

## 2013-12-29 NOTE — Progress Notes (Signed)
Inpatient Diabetes Program Recommendations  AACE/ADA: New Consensus Statement on Inpatient Glycemic Control (2013)  Target Ranges:  Prepandial:   less than 140 mg/dL      Peak postprandial:   less than 180 mg/dL (1-2 hours)      Critically ill patients:  140 - 180 mg/dL   Reason for Visit: DKA admission  Diabetes history: DM2 Outpatient Diabetes medications: None - Has previously been on Lantus and Novolog and 70/30  Current orders for Inpatient glycemic control: Lantus 25 units QD, Novolog moderate tidwc and hs +5 units tidwc  Patient with hx diabetes presents with 3 days of generalized body aches, abdominal pain, N/V/D, lightheadedness, SOB, heart racing, urinary frequency, and increased thirst. Pt states he has not had a medical doctor and has been out of his insulin for the past 2 years. Used to be on Lantus and Novolog. States that whenever he starts feeling bad he borrows some insulin from a friend or family member. Last insulin dose was novolog, two days ago. Denies ever having DKA in the past. Used to be a patient of [facility]. Has not had PCP since it closed. Per records, he is a type 2 diabetic. Found to be in DKA and placed on IV insulin per [facility]. AG closed and was transitioned to Lantus and Novolog.  No PCP - pt to make appt at [facility]. When discharged, will go to [facility] for insulin and meter/supplies.  Pt prefers to be on 70/30 with 2 shots/day instead of Lantus - Novolog which is 4 shots/day.  States he is working [employer] job part-time, but still has problems getting food. Discussed going to food banks, churches for food.  Results for Isaac, Moore (MRN [id number]) as of 12/29/2013 16:23  Ref. Range 12/28/2013 12:00  Hemoglobin A1C Latest Range: <5.7 % 11.2 (H)   Results for [name], [name] (MRN [id number]) as of 12/29/2013 16:23  Ref. Range 12/28/2013 12:36 12/28/2013 16:38 12/28/2013 20:58 12/29/2013 08:06 12/29/2013 12:24  Glucose-Capillary Latest Range: 70-99 mg/dL 867 (H)  544 (H) 920 (H) 312 (H) 244 (H)   Recommend 70/30 insulin for discharge. Long discussion regarding HgbA1C and importance of controlling blood sugars, taking meds as prescribed and f/u with [facility]. Pt voices understanding.  Inpatient Diabetes Program Recommendations Insulin - Basal: D/C Lantus - Start 70/30 20 units bid Insulin - Meal Coverage: Will not need meal coverage insulin with 70/30. HgbA1C: 11.2% - uncontrolled Outpatient Referral: OP Diabetes Education consult at [facility] for uncontrolled DM  Note: Will f/u with pt in am. Thank you. Ailene Ards, RD, LDN, CDE Inpatient Diabetes Coordinator [phone]

## 2013-12-29 NOTE — Care Management Note (Unsigned)
    Page 1 of 1   12/29/2013     3:55:47 PM CARE MANAGEMENT NOTE 12/29/2013  Patient:  [name], [name]   Account Number:  [id number]  Date Initiated:  12/29/2013  Documentation initiated by:  Allene Dillon  Subjective/Objective Assessment:   38 year old male admitted with DKA.     Action/Plan:   From home. No PCP. Needs assistance with medications.   Anticipated DC Date:  01/01/2014   Anticipated DC Plan:  [place]  CM consult      Choice offered to / List presented to:             Status of service:  In process, will continue to follow Medicare Important Message given?   (If response is "NO", the following Medicare IM given date fields will be blank) Date Medicare IM given:   Medicare IM given by:   Date Additional Medicare IM given:   Additional Medicare IM given by:    Discharge Disposition:    Per UR Regulation:  Reviewed for med. necessity/level of care/duration of stay  If discussed at [place] of Stay Meetings, dates discussed:    Comments:  12/29/13 Allene Dillon RN BSN [phone] Met with pt at bedside to discuss follow up care post hospitalization and access to medications. He stated he does not have a PCP at this time. He used to go to [facility] but hasn't had any follow up since they shut down.  CM provided pt with information on [facility] and [facility]. Encouraged him to stop by there on day of d/c to meet with the financial counselor and als make appt for post hospitalization follow up.  I discussed with him assistance with prescriptions through the [facility] program( assistance is once every 12 months and co-payment of $3.00 per prescription). He stated he will be able to cover the co-payment.  CM emphasized the importance of having a PCP and taking his prescriptions as prescribed. Pt was appreciative of the assistance offered.

## 2013-12-30 LAB — BASIC METABOLIC PANEL
Anion gap: 14 (ref 5–15)
BUN: 15 mg/dL (ref 6–23)
CO2: 27 mEq/L (ref 19–32)
Calcium: 9.2 mg/dL (ref 8.4–10.5)
Chloride: 100 mEq/L (ref 96–112)
Creatinine, Ser: 0.77 mg/dL (ref 0.50–1.35)
GFR calc Af Amer: >90 mL/min (ref >90)
GFR calc non Af Amer: >90 mL/min (ref >90)
Glucose, Bld: 53 mg/dL — ABNORMAL LOW (ref 70–99)
POTASSIUM: 3.5 meq/L — AB (ref 3.7–5.3)
Sodium: 141 mEq/L (ref 137–147)

## 2013-12-30 LAB — CBC
HCT: 40.4 % (ref 39.0–52.0)
Hemoglobin: 14.5 g/dL (ref 13.0–17.0)
MCH: 31.4 pg (ref 26.0–34.0)
MCHC: 35.9 g/dL (ref 30.0–36.0)
MCV: 87.4 fL (ref 78.0–100.0)
PLATELETS: 232 10*3/uL (ref 150–400)
RBC: 4.62 MIL/uL (ref 4.22–5.81)
RDW: 12.6 % (ref 11.5–15.5)
WBC: 9.7 10*3/uL (ref 4.0–10.5)

## 2013-12-30 LAB — GLUCOSE, CAPILLARY
GLUCOSE-CAPILLARY: 80 mg/dL (ref 70–99)
GLUCOSE-CAPILLARY: 116 mg/dL — AB (ref 70–99)
Glucose-Capillary: 176 mg/dL — ABNORMAL HIGH (ref 70–99)

## 2013-12-30 MED ORDER — BLOOD GLUCOSE METER KIT: PACK | Status: DC

## 2013-12-30 MED ORDER — INSULIN ASPART PROT & ASPART (70-30 MIX) 100 UNIT/ML ~~LOC~~ SUSP
25.0000 [IU] | Freq: Two times a day (BID) | SUBCUTANEOUS | Status: DC
  Administered 2013-12-30: 25 [IU] via SUBCUTANEOUS
  Filled 2013-12-30: qty 10

## 2013-12-30 MED ORDER — INSULIN NPH ISOPHANE & REGULAR (70-30) 100 UNIT/ML ~~LOC~~ SUSP: 25.0000 [IU] | Freq: Two times a day (BID) | SUBCUTANEOUS | Status: DC

## 2013-12-30 NOTE — Care Management Note (Signed)
CARE MANAGEMENT NOTE 12/30/2013  Patient:  [name],[name]   Account Number:  [id number]  Date Initiated:  12/29/2013  Documentation initiated by:  MIRINGU,RUTH  Subjective/Objective Assessment:   38 year old male admitted with DKA.     Action/Plan:   From home. No PCP. Needs assistance with medications.   Anticipated DC Date:  12/30/2013   Anticipated DC Plan:  HOME/SELF CARE      DC Planning Services  CM consult      Choice offered to / List presented to:             Status of service:  Completed, signed off Medicare Important Message given?   (If response is "NO", the following Medicare IM given date fields will be blank) Date Medicare IM given:   Medicare IM given by:   Date Additional Medicare IM given:   Additional Medicare IM given by:    Discharge Disposition:    Per UR Regulation:  Reviewed for med. necessity/level of care/duration of stay  If discussed at [place] of Stay Meetings, dates discussed:    Comments:  12/30/13 Nora Clements RN,BSN,NCM Pt to DC home today.  Was given MATCH letter and instructed to go to one of the pharmacies listed on the MATCH list also given to him.  Pt states that he will go to [facility] today to get set up for a follow up appointment.  Pt appreciative of CM assistance.  12/29/13 RUTH MIRINGU RN BSN [phone] Met with pt at bedside to discuss follow up care post hospitalization and access to medications. He stated he does not have a PCP at this time. He used to go to [facility] but hasn't had any follow up since they shut down.  CM provided pt with information on [facility]. Encouraged him to stop by there on day of d/c to meet with the financial counselor and als make appt for post hospitalization follow up.  I discussed with him assistance with prescriptions through the MATCH program( assistance is once every 12 months and co-payment of $3.00 per prescription). He stated he will be able to cover the co-payment.  CM  emphasized the importance of having a PCP and taking his prescriptions as prescribed. Pt was appreciative of the assistance offered.   

## 2013-12-30 NOTE — Discharge Summary (Signed)
Physician Discharge Summary  Rana Nakata SKA:[id number] DOB: [birth year] DOA: 12/27/2013  PCP: No PCP Per Patient  Admit date: 12/27/2013 Discharge date: 12/30/2013  Recommendations for Outpatient Follow-up:  1. F/u with community health and wellness for ongoing diabetes management and substance abuse counseling  Discharge Diagnoses:  Principal Problem:   DKA (diabetic ketoacidoses) Active Problems:   Neuropathy   Substance abuse   Alcohol dependence   Abdominal pain, epigastric   Protein-calorie malnutrition, severe   Discharge Condition: stable, improved  Diet recommendation: diabetic  Wt Readings from Last 3 Encounters:  12/28/13 58.06 kg (128 lb)    History of present illness:  [name] is a 38 y.o. male with Past medical history of diabetes mellitus who presented with abdominal pain,  body aches, lightheadedness, polyuria and polydipsia.  He admitted to not taking insulin on a regular bases in 2 years.     Hospital Course:  DKA (diabetic ketoacidoses), initial bicarb 19 and AG of 26, pH 7.31.  A1c 11.  Started on insulin gtt and transitioned to subcutaneous insulin initially lantus + aspart, but due to financial reasons, he has been prescribed 70/30 insulin 25 units BID at discharge.  He will need ongoing diabetes management and screening by his PCP.  He seemed receptive to diabetic teaching and would like to get his diabetes under control.  Expect fair compliance but his finances are limited and he does not have a regular PCP which may hinder success.  He met with the case manager, Education officer, museum, and Network engineer and received information and assistance where able.    No insurance or PCP, as above.  F/u with community health and wellness.   Abdominal pain, likely due to DKA.  UA, LFTs, lipase, KUB wnl.  He was started on PPI and his symptoms improved.    Polysubstance abuse: Opiates, Cocaine, THC positive. Admits to EtOH use. No evidence of withdrawal during  admission.  SW counseled cessation.  Patient contemplative.  Tobacco dependence with withdrawal.  Given nicotine patch and counseled cessation.     Consultants:  Diabetic educator Procedures:  KUB Antibiotics:  none    Discharge Exam: Filed Vitals:   12/30/13 0926  BP: 107/69  Pulse: 86  Temp: 98.1 F (36.7 C)  Resp: 16   Filed Vitals:   12/29/13 1800 12/29/13 2044 12/30/13 0452 12/30/13 0926  BP: 113/82 109/68 116/75 107/69  Pulse: 90 108 99 86  Temp: 98.4 F (36.9 C) 98.4 F (36.9 C) 98.6 F (37 C) 98.1 F (36.7 C)  TempSrc: Oral Oral Oral Oral  Resp: _0 Height:      Weight:      SpO2: 100% 100% 100% 100%    General: Thin BM, No acute distress, states he feels ready to go home and would like to return to work ASAP HEENT: NCAT, MMM  Cardiovascular: RRR, nl S1, S2 no mrg, 2+ pulses, warm extremities  Respiratory: CTAB, no increased WOB  Abdomen: NABS, soft, NT/D MSK: Normal tone and bulk, no LEE  Neuro: Grossly intact   Discharge Instructions      Discharge Instructions   Call MD for:  difficulty breathing, headache or visual disturbances    Complete by:  As directed      Call MD for:  extreme fatigue    Complete by:  As directed      Call MD for:  hives    Complete by:  As directed      Call MD for:  persistant dizziness or light-headedness    Complete by:  As directed      Call MD for:  persistant nausea and vomiting    Complete by:  As directed      Call MD for:  severe uncontrolled pain    Complete by:  As directed      Call MD for:  temperature >100.4    Complete by:  As directed      Diet Carb Modified    Complete by:  As directed      Discharge instructions    Complete by:  As directed   You were hospitalized with diabetic ketoacidosis which can be life threatening.  Please use mixed insulin 70/30 25 units twice a day for now.  Please call the community health and wellness clinic today to schedule an appointment for as soon as  possible.  You will need follow up to get new prescriptions/refills on your insulin and to have routine diabetes screening (eyes, feet, etc.).  They also have a Development worker, community.  Please plan ahead if possible and try to contact your doctor well before you run out of your insulin or diabetes supplies for refills.     Increase activity slowly    Complete by:  As directed             Medication List         blood glucose meter kit and supplies  Dispense based on patient and insurance preference. Use up to four times daily as directed. (FOR ICD-9 250.00, 250.01).     insulin NPH-regular Human (70-30) 100 UNIT/ML injection  Commonly known as:  NOVOLIN 70/30  Inject 25 Units into the skin 2 (two) times daily with a meal.       Follow-up Information   Follow up with [facility]    . Schedule an appointment as soon as possible for a visit in 2 weeks.   Contact information:   [place] [id number] [phone]       The results of significant diagnostics from this hospitalization (including imaging, microbiology, ancillary and laboratory) are listed below for reference.    Significant Diagnostic Studies: Dg Abd Acute W/chest  12/28/2013   CLINICAL DATA:  Chest pain, weakness and dizziness.  EXAM: ACUTE ABDOMEN SERIES (ABDOMEN 2 VIEW & CHEST 1 VIEW)  COMPARISON:  09/22/2010.  FINDINGS: The upright chest x-ray is normal.  Two views of the abdomen demonstrate scattered air and stool in the colon and scattered small bowel loops with air but no distention. There are few scattered small bowel air-fluid levels. No free air. The soft tissue shadows are maintained. No worrisome calcifications.  IMPRESSION: Normal chest x-ray.  No plain film findings for an acute abdominal process.   Electronically Signed   By: Kalman Jewels M.D.   On: 12/28/2013 00:07    Microbiology: Recent Results (from the past 240 hour(s))  MRSA PCR SCREENING      Status: None   Collection Time    12/28/13  1:15 AM      Result Value Ref Range Status   MRSA by PCR NEGATIVE  NEGATIVE Final   Comment:            The GeneXpert MRSA Assay (FDA     approved for NASAL specimens     only), is one component of a     comprehensive MRSA colonization     surveillance program. It is not  intended to diagnose MRSA     infection nor to guide or     monitor treatment for     MRSA infections.     Labs: Basic Metabolic Panel:  Recent Labs Lab 12/28/13 0518 12/28/13 0720 12/28/13 1200 12/29/13 0405 12/30/13 0350  NA 141  141 141 138 138 141  K 3.6*  3.6* 3.6* 4.1 4.1 3.5*  CL 106  106 106 102 100 100  CO2 _0 GLUCOSE 121*  120* 100* 213* 284* 53*  BUN _1 CREATININE 0.84  0.83 0.80 0.81 0.79 0.77  CALCIUM 8.1*  8.3* 8.3* 8.4 8.8 9.2   Liver Function Tests:  Recent Labs Lab 12/27/13 2126 12/28/13 0518  AST 29 20  ALT 37 26  ALKPHOS 127* 86  BILITOT 0.5 0.4  PROT 7.9 5.9*  ALBUMIN 4.5 3.4*    Recent Labs Lab 12/27/13 2126  LIPASE 15   No results found for this basename: AMMONIA,  in the last 168 hours CBC:  Recent Labs Lab 12/27/13 2126 12/28/13 0518 12/29/13 0405 12/30/13 0350  WBC 10.5 9.0 8.0 9.7  NEUTROABS  --  4.5  --   --   HGB 15.4 12.9* 13.8 14.5  HCT 44.6 36.0* 39.3 40.4  MCV 92.5 89.8 89.9 87.4  PLT 257 218 194 232   Cardiac Enzymes: No results found for this basename: CKTOTAL, CKMB, CKMBINDEX, TROPONINI,  in the last 168 hours BNP: BNP (last 3 results) No results found for this basename: PROBNP,  in the last 8760 hours CBG:  Recent Labs Lab 12/29/13 1726 12/29/13 2129 12/30/13 12/30/13 0626 12/30/13 0739  GLUCAP 431* 254* 121* 80 176*    Time coordinating discharge: 35  minutes  Signed:  SHORT, MACKENZIE  [facility] 12/30/2013, 11:23 AM

## 2014-06-22 ENCOUNTER — Emergency Department (HOSPITAL_COMMUNITY)
Admission: EM | Admit: 2014-06-22 | Discharge: 2014-06-23 | Disposition: A | Discharge status: Home / Self Care | Payer: Self-pay | Attending: Emergency Medicine | Admitting: Emergency Medicine

## 2014-06-22 ENCOUNTER — Encounter (HOSPITAL_COMMUNITY): Payer: Self-pay | Admitting: Emergency Medicine

## 2014-06-22 DIAGNOSIS — Z79899 Other long term (current) drug therapy: Secondary | ICD-10-CM | POA: Insufficient documentation

## 2014-06-22 DIAGNOSIS — R739 Hyperglycemia, unspecified: Secondary | ICD-10-CM

## 2014-06-22 DIAGNOSIS — K0889 Other specified disorders of teeth and supporting structures: Secondary | ICD-10-CM

## 2014-06-22 DIAGNOSIS — K029 Dental caries, unspecified: Secondary | ICD-10-CM | POA: Insufficient documentation

## 2014-06-22 DIAGNOSIS — K088 Other specified disorders of teeth and supporting structures: Secondary | ICD-10-CM | POA: Insufficient documentation

## 2014-06-22 DIAGNOSIS — Z720 Tobacco use: Secondary | ICD-10-CM | POA: Insufficient documentation

## 2014-06-22 DIAGNOSIS — E1165 Type 2 diabetes mellitus with hyperglycemia: Secondary | ICD-10-CM | POA: Insufficient documentation

## 2014-06-22 LAB — COMPREHENSIVE METABOLIC PANEL
ALK PHOS: 155 U/L — AB (ref 39–117)
ALT: 28 U/L (ref 0–53)
AST: 24 U/L (ref 0–37)
Albumin: 5.2 g/dL (ref 3.5–5.2)
Anion gap: 20 — ABNORMAL HIGH (ref 5–15)
BUN: 31 mg/dL — ABNORMAL HIGH (ref 6–23)
CO2: 16 mmol/L — AB (ref 19–32)
Calcium: 9.3 mg/dL (ref 8.4–10.5)
Chloride: 93 mmol/L — ABNORMAL LOW (ref 96–112)
Creatinine, Ser: 1.4 mg/dL — ABNORMAL HIGH (ref 0.50–1.35)
GFR, EST AFRICAN AMERICAN: 71 mL/min — AB (ref >90)
GFR, EST NON AFRICAN AMERICAN: 61 mL/min — AB (ref >90)
Glucose, Bld: 615 mg/dL (ref 70–99)
POTASSIUM: 4.9 mmol/L (ref 3.5–5.1)
SODIUM: 129 mmol/L — AB (ref 135–145)
TOTAL PROTEIN: 8.5 g/dL — AB (ref 6.0–8.3)
Total Bilirubin: 1.4 mg/dL — ABNORMAL HIGH (ref 0.3–1.2)

## 2014-06-22 LAB — I-STAT TROPONIN, ED: TROPONIN I, POC: 0 ng/mL (ref 0.00–0.08)

## 2014-06-22 LAB — CBC WITH DIFFERENTIAL/PLATELET
Basophils Absolute: 0 10*3/uL (ref 0.0–0.1)
Basophils Relative: 0 % (ref 0–1)
Eosinophils Absolute: 0 10*3/uL (ref 0.0–0.7)
Eosinophils Relative: 0 % (ref 0–5)
HEMATOCRIT: 47.5 % (ref 39.0–52.0)
HEMOGLOBIN: 16.6 g/dL (ref 13.0–17.0)
LYMPHS PCT: 19 % (ref 12–46)
Lymphs Abs: 2.2 10*3/uL (ref 0.7–4.0)
MCH: 32.7 pg (ref 26.0–34.0)
MCHC: 34.9 g/dL (ref 30.0–36.0)
MCV: 93.7 fL (ref 78.0–100.0)
MONOS PCT: 7 % (ref 3–12)
Monocytes Absolute: 0.8 10*3/uL (ref 0.1–1.0)
NEUTROS ABS: 8.7 10*3/uL — AB (ref 1.7–7.7)
Neutrophils Relative %: 74 % (ref 43–77)
Platelets: 218 10*3/uL (ref 150–400)
RBC: 5.07 MIL/uL (ref 4.22–5.81)
RDW: 13 % (ref 11.5–15.5)
WBC: 11.8 10*3/uL — AB (ref 4.0–10.5)

## 2014-06-22 LAB — I-STAT CHEM 8, ED
BUN: 31 mg/dL — ABNORMAL HIGH (ref 6–23)
CALCIUM ION: 1.08 mmol/L — AB (ref 1.12–1.23)
CHLORIDE: 98 mmol/L (ref 96–112)
CREATININE: 1.1 mg/dL (ref 0.50–1.35)
GLUCOSE: 629 mg/dL — AB (ref 70–99)
HCT: 54 % — ABNORMAL HIGH (ref 39.0–52.0)
Hemoglobin: 18.4 g/dL — ABNORMAL HIGH (ref 13.0–17.0)
Potassium: 4.9 mmol/L (ref 3.5–5.1)
SODIUM: 130 mmol/L — AB (ref 135–145)
TCO2: 14 mmol/L (ref 0–100)

## 2014-06-22 LAB — BLOOD GAS, VENOUS
Acid-base deficit: 12 mmol/L — ABNORMAL HIGH (ref 0.0–2.0)
BICARBONATE: 13.9 meq/L — AB (ref 20.0–24.0)
FIO2: 0.21 %
O2 Saturation: 75.3 %
PCO2 VEN: 32.3 mmHg — AB (ref 45.0–50.0)
PH VEN: 7.257 (ref 7.250–7.300)
Patient temperature: 98.6
TCO2: 12.4 mmol/L (ref 0–100)
pO2, Ven: 46.5 mmHg — ABNORMAL HIGH (ref 30.0–45.0)

## 2014-06-22 LAB — I-STAT CG4 LACTIC ACID, ED: Lactic Acid, Venous: 1.76 mmol/L (ref 0.5–2.0)

## 2014-06-22 MED ORDER — BUPIVACAINE-EPINEPHRINE (PF) 0.5% -1:200000 IJ SOLN
1.8000 mL | Freq: Once | INTRAMUSCULAR | Status: AC
  Administered 2014-06-22: 1.8 mL
  Filled 2014-06-22: qty 1.8

## 2014-06-22 MED ORDER — INSULIN ASPART 100 UNIT/ML ~~LOC~~ SOLN
15.0000 [IU] | Freq: Once | SUBCUTANEOUS | Status: AC
  Administered 2014-06-22: 15 [IU] via INTRAVENOUS
  Filled 2014-06-22: qty 1

## 2014-06-22 MED ORDER — SODIUM CHLORIDE 0.9 % IV BOLUS (SEPSIS)
2000.0000 mL | Freq: Once | INTRAVENOUS | Status: AC
  Administered 2014-06-22: 2000 mL via INTRAVENOUS

## 2014-06-22 MED ORDER — SODIUM CHLORIDE 0.9 % IV BOLUS (SEPSIS): 1000.0000 mL | Freq: Once | INTRAVENOUS | Status: DC

## 2014-06-22 NOTE — Progress Notes (Signed)
EDCM spoke to [facility], EDPA in patient's room.  Discussed patient with EDRN.  If patient discharged from the ED this evening, please write for true test lancets, true test blood glucose testing strips and insulin if appropriate.

## 2014-06-22 NOTE — ED Notes (Signed)
Pt c/o of R upper dental pain since last night. Pt has had issues with the same area in the past. Pt does not have a dentist he sees. Pt A&Ox4.

## 2014-06-22 NOTE — ED Provider Notes (Signed)
CSN: [id number]     Arrival date & time 06/22/14  1843 History  This chart was scribed for non-physician practitioner, Antonietta Breach, PA-C, working with Dorie Rank, MD, by Stephania Fragmin, ED Scribe. This patient was seen in room WTR5/WTR5 and the patient's care was started at 8:45 PM.    Chief Complaint  Patient presents with  . Dental Pain   The history is provided by the patient. No language interpreter was used.     HPI Comments: [name] is a 39 y.o. male with a history of DM who presents to the Emergency Department complaining of chronic right upper dental pain that has been ongoing for 4 years but acutely worsened 2 days ago, with constant, severe, sharp, throbbing pain radiating to his right ear. He notes he has had some sporadic bleeding. Patient states the pain causes sleep disturbance and agitation. He complains that he cannot eat anything besides soft foods, as eating solid foods severely exacerbates the pain. Patient has taken an antibiotic for pain in the same area in the past, which was effective. He has taken ibuprofen with some relief. He is unsure whether he has a fever, but states he just has not been feeling well. Patient does not have a dentist and would like a referral to one.   Patient was hospitalized a few months ago for DKA. He states he is trying to control his DM with diet and exercise, but he is taking insulin, including Lantus and Levemir, given to him from people at his church. He can't remember the last time he checked his blood sugar, although he has normally run around 300 in the past.   Past Medical History  Diagnosis Date  . Diabetes mellitus without complication    History reviewed. No pertinent past surgical history. No family history on file. History  Substance Use Topics  . Smoking status: Current Every Day Smoker -- 0.25 packs/day for 20 years    Types: Cigarettes  . Smokeless tobacco: Never Used  . Alcohol Use: 24.0 oz/week    40 Cans of beer per  week    Review of Systems  HENT: Positive for dental problem.   All other systems reviewed and are negative.   Allergies  Shellfish allergy  Home Medications   Prior to Admission medications   Medication Sig Start Date End Date Taking? Authorizing Provider  Blood Glucose Monitoring Suppl (BLOOD GLUCOSE METER KIT AND SUPPLIES) Dispense based on patient and insurance preference. Use up to four times daily as directed. (FOR ICD-9 250.00, 250.01). 12/30/13  Yes Janece Canterbury, MD  ibuprofen (ADVIL,MOTRIN) 200 MG tablet Take 800 mg by mouth every 6 (six) hours as needed for mild pain or moderate pain.   Yes Historical Provider, MD  glucose blood test strip Use as instructed 06/23/14   Antonietta Breach, PA-C  HYDROcodone-acetaminophen (NORCO/VICODIN) 5-325 MG per tablet Take 1-2 tablets by mouth every 6 (six) hours as needed. 06/23/14   Antonietta Breach, PA-C  insulin NPH-regular Human (NOVOLIN 70/30) (70-30) 100 UNIT/ML injection Inject 25 Units into the skin 2 (two) times daily with a meal. 06/23/14   Antonietta Breach, PA-C  penicillin v potassium (VEETID) 500 MG tablet Take 1 tablet (500 mg total) by mouth 4 (four) times daily. 06/23/14 06/30/14  Antonietta Breach, PA-C   BP 134/82 mmHg  Pulse 103  Temp(Src) 98.7 F (37.1 C) (Oral)  Resp 16  SpO2 99%   Physical Exam  Constitutional: He is oriented to person, place, and time. He  appears well-developed and well-nourished. No distress.  Nontoxic/nonseptic appearing  HENT:  Head: Normocephalic and atraumatic.  Mouth/Throat: Uvula is midline and oropharynx is clear and moist. No oral lesions. No trismus in the jaw. Abnormal dentition. Dental caries present. No dental abscesses or uvula swelling.    Mildly dry mm  Eyes: Conjunctivae and EOM are normal. No scleral icterus.  Neck: Normal range of motion.  Cardiovascular: Normal rate, regular rhythm and intact distal pulses.   Pulmonary/Chest: Effort normal and breath sounds normal. No respiratory distress. He has no  wheezes. He has no rales.  Respirations even and unlabored  Musculoskeletal: Normal range of motion.  Neurological: He is alert and oriented to person, place, and time. He exhibits normal muscle tone. Coordination normal.  Skin: Skin is warm and dry. No rash noted. He is not diaphoretic. No erythema. No pallor.  Psychiatric: He has a normal mood and affect. His behavior is normal.  Nursing note and vitals reviewed.   ED Course  Dental Date/Time: 06/22/2014 10:10 PM Performed by: Antonietta Breach Authorized by: Antonietta Breach Consent: The procedure was performed in an emergent situation. Verbal consent obtained. Written consent not obtained. Risks and benefits: risks, benefits and alternatives were discussed Consent given by: patient Patient understanding: patient states understanding of the procedure being performed Patient consent: the patient's understanding of the procedure matches consent given Procedure consent: procedure consent matches procedure scheduled Relevant documents: relevant documents present and verified Test results: test results available and properly labeled Site marked: the operative site was marked Imaging studies: imaging studies available Required items: required blood products, implants, devices, and special equipment available Patient identity confirmed: verbally with patient and arm band Time out: Immediately prior to procedure a "time out" was called to verify the correct patient, procedure, equipment, support staff and site/side marked as required. Preparation: Patient was prepped and draped in the usual sterile fashion. Local anesthesia used: yes Anesthesia: local infiltration Local anesthetic: bupivacaine 0.5% with epinephrine Anesthetic total: 1.8 ml Patient sedated: no Patient tolerance: Patient tolerated the procedure well with no immediate complications Comments: Local infiltration to R upper 1st and 2nd premolar for dental pain   (including critical  care time)  DIAGNOSTIC STUDIES: Oxygen Saturation is 98% on room air, normal by my interpretation.    COORDINATION OF CARE: 8:52 PM - Discussed treatment plan with pt at bedside which includes a dental block, prescription for antibiotics, pain medication administered here, and referral to Dr. Geralynn Ochs, [place], and pt agreed to plan.   Medications  penicillin v potassium (VEETID) tablet 500 mg (not administered)  bupivacaine-epinephrine (MARCAINE W/ EPI) 0.5% -1:200000 injection 1.8 mL (1.8 mLs Infiltration Given by Other 06/22/14 2140)  sodium chloride 0.9 % bolus 2,000 mL (0 mLs Intravenous Stopped 06/23/14 0017)  insulin aspart (novoLOG) injection 15 Units (15 Units Intravenous Given 06/22/14 2223)  naproxen (NAPROSYN) tablet 500 mg (500 mg Oral Given 06/23/14 0015)   Results for orders placed or performed during the hospital encounter of 06/22/14  CBC with Differential  Result Value Ref Range   WBC 11.8 (H) 4.0 - 10.5 K/uL   RBC 5.07 4.22 - 5.81 MIL/uL   Hemoglobin 16.6 13.0 - 17.0 g/dL   HCT 47.5 39.0 - 52.0 %   MCV 93.7 78.0 - 100.0 fL   MCH 32.7 26.0 - 34.0 pg   MCHC 34.9 30.0 - 36.0 g/dL   RDW 13.0 11.5 - 15.5 %   Platelets 218 150 - 400 K/uL   Neutrophils Relative % 74  43 - 77 %   Neutro Abs 8.7 (H) 1.7 - 7.7 K/uL   Lymphocytes Relative 19 12 - 46 %   Lymphs Abs 2.2 0.7 - 4.0 K/uL   Monocytes Relative 7 3 - 12 %   Monocytes Absolute 0.8 0.1 - 1.0 K/uL   Eosinophils Relative 0 0 - 5 %   Eosinophils Absolute 0.0 0.0 - 0.7 K/uL   Basophils Relative 0 0 - 1 %   Basophils Absolute 0.0 0.0 - 0.1 K/uL  Comprehensive metabolic panel  Result Value Ref Range   Sodium 129 (L) 135 - 145 mmol/L   Potassium 4.9 3.5 - 5.1 mmol/L   Chloride 93 (L) 96 - 112 mmol/L   CO2 16 (L) 19 - 32 mmol/L   Glucose, Bld 615 (HH) 70 - 99 mg/dL   BUN 31 (H) 6 - 23 mg/dL   Creatinine, Ser 1.40 (H) 0.50 - 1.35 mg/dL   Calcium 9.3 8.4 - 10.5 mg/dL   Total Protein 8.5 (H) 6.0 - 8.3 g/dL   Albumin 5.2 3.5 - 5.2  g/dL   AST 24 0 - 37 U/L   ALT 28 0 - 53 U/L   Alkaline Phosphatase 155 (H) 39 - 117 U/L   Total Bilirubin 1.4 (H) 0.3 - 1.2 mg/dL   GFR calc non Af Amer 61 (L) >90 mL/min   GFR calc Af Amer 71 (L) >90 mL/min   Anion gap 20 (H) 5 - 15  Blood gas, venous  Result Value Ref Range   FIO2 0.21 %   pH, Ven 7.257 7.250 - 7.300   pCO2, Ven 32.3 (L) 45.0 - 50.0 mmHg   pO2, Ven 46.5 (H) 30.0 - 45.0 mmHg   Bicarbonate 13.9 (L) 20.0 - 24.0 mEq/L   TCO2 12.4 0 - 100 mmol/L   Acid-base deficit 12.0 (H) 0.0 - 2.0 mmol/L   O2 Saturation 75.3 %   Patient temperature 98.6    Collection site VEIN    Drawn by COLLECTED BY LABORATORY    Sample type VEIN   Basic metabolic panel  Result Value Ref Range   Sodium 135 135 - 145 mmol/L   Potassium 3.8 3.5 - 5.1 mmol/L   Chloride 105 96 - 112 mmol/L   CO2 17 (L) 19 - 32 mmol/L   Glucose, Bld 362 (H) 70 - 99 mg/dL   BUN 28 (H) 6 - 23 mg/dL   Creatinine, Ser 1.25 0.50 - 1.35 mg/dL   Calcium 8.1 (L) 8.4 - 10.5 mg/dL   GFR calc non Af Amer 70 (L) >90 mL/min   GFR calc Af Amer 81 (L) >90 mL/min   Anion gap 13 5 - 15  CBG monitoring, ED  Result Value Ref Range   Glucose-Capillary >600 (HH) 70 - 99 mg/dL  I-stat chem 8, ed  Result Value Ref Range   Sodium 130 (L) 135 - 145 mmol/L   Potassium 4.9 3.5 - 5.1 mmol/L   Chloride 98 96 - 112 mmol/L   BUN 31 (H) 6 - 23 mg/dL   Creatinine, Ser 1.10 0.50 - 1.35 mg/dL   Glucose, Bld 629 (HH) 70 - 99 mg/dL   Calcium, Ion 1.08 (L) 1.12 - 1.23 mmol/L   TCO2 14 0 - 100 mmol/L   Hemoglobin 18.4 (H) 13.0 - 17.0 g/dL   HCT 54.0 (H) 39.0 - 52.0 %   Comment NOTIFIED PHYSICIAN   I-Stat CG4 Lactic Acid, ED  Result Value Ref Range   Lactic Acid, Venous  1.76 0.5 - 2.0 mmol/L  I-stat troponin, ED  Result Value Ref Range   Troponin i, poc 0.00 0.00 - 0.08 ng/mL   Comment 3          CBG monitoring, ED  Result Value Ref Range   Glucose-Capillary 325 (H) 70 - 99 mg/dL   Comment 1 Notify RN    MDM   Final diagnoses:   Dentalgia  Hyperglycemia      39 year old male presents to the emergency department for further evaluation of dentalgia. Patient nontoxic and nonseptic appearing with no findings concerning for Ludwig's angina on examination. Patient given dental block with improvement in his dental pain. He also explained his difficulty controlling his diabetes and his inability to afford medication. He states that he has been getting various insulin medications from some fellow church members. CBG was performed and was found to be >600 at which time further laboratory work up was initiated.  Patient's glucose on CMP was 6:15. He did have a mild anion gap of 20, but a normal lactate and VBG pH. Patient was hydrated in the emergency department with 2 L IV fluids. He was also given NovoLog for his hyperglycemia. Over ED course, CBG improved to 325. His anion gap also closed to 13 on repeat BMP.   Patient expresses on numerous occassions that he does not desire admission today. Given improvement in his blood sugar with a closing of his anion gap, I feel he can be safely discharged today. Will prescribe PCN for dentalgia as well as Norco for pain control. Patient given Novolin 70/30 Rx and instruction to f/u with the [facility] tomorrow for a recheck of his blood sugar and to establish primary care for diabetes management. Resource guide provided and return precautions discussed. Patient agreeable to plan with no unaddressed concerns. Patient discharged in good condition.  I personally performed the services described in this documentation, which was scribed in my presence. The recorded information has been reviewed and is accurate.     Antonietta Breach, PA-C 06/26/14 [id number]  Dorie Rank, MD 06/28/14 2006

## 2014-06-22 NOTE — ED Notes (Signed)
CBG >600. PA made aware.

## 2014-06-22 NOTE — Progress Notes (Addendum)
EDCM spoke to patient at bedside. Patient listed as not having insurance or a pcp living in [place].  Patient reports to [facility] that he is having horrible mouth pain and is requiring pain medication.  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  "A dentist on [place]." per patient.  Patient confirms he is diabetic and cannot afford his diabetic medication because, "I got three kids."  [facility] asked patient where he was getting his insulin if he doesn't have a doctor?  Patient responded, "I get it from my friends who take the same thing as I do."  EDCM provided patient with pamphlet to [facility].  [facility] informed patient of walk in times.  EDCM discussed importance and purpose of having a pcp.  Patient verbalized understanding and reports he will be going to the [facility] tomorrow.  [facility] asked patient if he has lancets and testing strips at home?  Patient reports, "I do have them but I hardly use that machine because I don't want to run out."  Patient confirms insulin he was discharged from the hospital was novolin insulin 70/30 25 units in am and 25 units in pm. Discussed patient with EDPA.  No further EDCM needs at this time.

## 2014-06-23 LAB — BASIC METABOLIC PANEL
ANION GAP: 13 (ref 5–15)
BUN: 28 mg/dL — AB (ref 6–23)
CO2: 17 mmol/L — AB (ref 19–32)
CREATININE: 1.25 mg/dL (ref 0.50–1.35)
Calcium: 8.1 mg/dL — ABNORMAL LOW (ref 8.4–10.5)
Chloride: 105 mmol/L (ref 96–112)
GFR calc non Af Amer: 70 mL/min — ABNORMAL LOW (ref >90)
GFR, EST AFRICAN AMERICAN: 81 mL/min — AB (ref >90)
GLUCOSE: 362 mg/dL — AB (ref 70–99)
Potassium: 3.8 mmol/L (ref 3.5–5.1)
SODIUM: 135 mmol/L (ref 135–145)

## 2014-06-23 LAB — CBG MONITORING, ED: Glucose-Capillary: 325 mg/dL — ABNORMAL HIGH (ref 70–99)

## 2014-06-23 MED ORDER — HYDROCODONE-ACETAMINOPHEN 5-325 MG PO TABS: 1.0000 | ORAL_TABLET | Freq: Four times a day (QID) | ORAL | Status: DC | PRN

## 2014-06-23 MED ORDER — INSULIN NPH ISOPHANE & REGULAR (70-30) 100 UNIT/ML ~~LOC~~ SUSP: 25.0000 [IU] | Freq: Two times a day (BID) | SUBCUTANEOUS | Status: DC

## 2014-06-23 MED ORDER — GLUCOSE BLOOD VI STRP: ORAL_STRIP | Status: DC

## 2014-06-23 MED ORDER — PENICILLIN V POTASSIUM 500 MG PO TABS
500.0000 mg | ORAL_TABLET | Freq: Once | ORAL | Status: AC
Start: 2014-06-23 — End: 2014-06-23
  Administered 2014-06-23: 500 mg via ORAL
  Filled 2014-06-23: qty 1

## 2014-06-23 MED ORDER — NAPROXEN 500 MG PO TABS
500.0000 mg | ORAL_TABLET | Freq: Once | ORAL | Status: AC
  Administered 2014-06-23: 500 mg via ORAL
  Filled 2014-06-23: qty 1

## 2014-06-23 MED ORDER — PENICILLIN V POTASSIUM 500 MG PO TABS: 500.0000 mg | ORAL_TABLET | Freq: Four times a day (QID) | ORAL | Status: AC

## 2014-06-23 NOTE — ED Notes (Signed)
Pt ambulating independently w/ steady gait on d/c in no acute distress, A&Ox4. D/c instructions reviewed w/ pt and family - pt and family deny any further questions or concerns at present. Rx given x3  

## 2014-06-23 NOTE — Discharge Instructions (Signed)
Dental Pain A tooth ache may be caused by cavities (tooth decay). Cavities expose the nerve of the tooth to air and hot or cold temperatures. It may come from an infection or abscess (also called a boil or furuncle) around your tooth. It is also often caused by dental caries (tooth decay). This causes the pain you are having. DIAGNOSIS  Your caregiver can diagnose this problem by exam. TREATMENT   If caused by an infection, it may be treated with medications which kill germs (antibiotics) and pain medications as prescribed by your caregiver. Take medications as directed.  Only take over-the-counter or prescription medicines for pain, discomfort, or fever as directed by your caregiver.  Whether the tooth ache today is caused by infection or dental disease, you should see your dentist as soon as possible for further care. SEEK MEDICAL CARE IF: The exam and treatment you received today has been provided on an emergency basis only. This is not a substitute for complete medical or dental care. If your problem worsens or new problems (symptoms) appear, and you are unable to meet with your dentist, call or return to this location. SEEK IMMEDIATE MEDICAL CARE IF:   You have a fever.  You develop redness and swelling of your face, jaw, or neck.  You are unable to open your mouth.  You have severe pain uncontrolled by pain medicine. MAKE SURE YOU:   Understand these instructions.  Will watch your condition.  Will get help right away if you are not doing well or get worse. Document Released: 04/03/2005 Document Revised: 06/26/2011 Document Reviewed: 11/20/2007 [facility] Patient Information 2015 [place], [place]. This information is not intended to replace advice given to you by your health care provider. Make sure you discuss any questions you have with your health care provider. Hyperglycemia Hyperglycemia occurs when the glucose (sugar) in your blood is too high. Hyperglycemia can happen for  many reasons, but it most often happens to people who do not know they have diabetes or are not managing their diabetes properly.  CAUSES  Whether you have diabetes or not, there are other causes of hyperglycemia. Hyperglycemia can occur when you have diabetes, but it can also occur in other situations that you might not be as aware of, such as: Diabetes  If you have diabetes and are having problems controlling your blood glucose, hyperglycemia could occur because of some of the following reasons:  Not following your meal plan.  Not taking your diabetes medications or not taking it properly.  Exercising less or doing less activity than you normally do.  Being sick. Pre-diabetes  This cannot be ignored. Before people develop Type 2 diabetes, they almost always have "pre-diabetes." This is when your blood glucose levels are higher than normal, but not yet high enough to be diagnosed as diabetes. Research has shown that some long-term damage to the body, especially the heart and circulatory system, may already be occurring during pre-diabetes. If you take action to manage your blood glucose when you have pre-diabetes, you may delay or prevent Type 2 diabetes from developing. Stress  If you have diabetes, you may be "diet" controlled or on oral medications or insulin to control your diabetes. However, you may find that your blood glucose is higher than usual in the hospital whether you have diabetes or not. This is often referred to as "stress hyperglycemia." Stress can elevate your blood glucose. This happens because of hormones put out by the body during times of stress. If stress has  been the cause of your high blood glucose, it can be followed regularly by your caregiver. That way he/she can make sure your hyperglycemia does not continue to get worse or progress to diabetes. Steroids  Steroids are medications that act on the infection fighting system (immune system) to block inflammation or  infection. One side effect can be a rise in blood glucose. Most people can produce enough extra insulin to allow for this rise, but for those who cannot, steroids make blood glucose levels go even higher. It is not unusual for steroid treatments to "uncover" diabetes that is developing. It is not always possible to determine if the hyperglycemia will go away after the steroids are stopped. A special blood test called an A1c is sometimes done to determine if your blood glucose was elevated before the steroids were started. SYMPTOMS  Thirsty.  Frequent urination.  Dry mouth.  Blurred vision.  Tired or fatigue.  Weakness.  Sleepy.  Tingling in feet or leg. DIAGNOSIS  Diagnosis is made by monitoring blood glucose in one or all of the following ways:  A1c test. This is a chemical found in your blood.  Fingerstick blood glucose monitoring.  Laboratory results. TREATMENT  First, knowing the cause of the hyperglycemia is important before the hyperglycemia can be treated. Treatment may include, but is not be limited to:  Education.  Change or adjustment in medications.  Change or adjustment in meal plan.  Treatment for an illness, infection, etc.  More frequent blood glucose monitoring.  Change in exercise plan.  Decreasing or stopping steroids.  Lifestyle changes. HOME CARE INSTRUCTIONS   Test your blood glucose as directed.  Exercise regularly. Your caregiver will give you instructions about exercise. Pre-diabetes or diabetes which comes on with stress is helped by exercising.  Eat wholesome, balanced meals. Eat often and at regular, fixed times. Your caregiver or nutritionist will give you a meal plan to guide your sugar intake.  Being at an ideal weight is important. If needed, losing as little as 10 to 15 pounds may help improve blood glucose levels. SEEK MEDICAL CARE IF:   You have questions about medicine, activity, or diet.  You continue to have symptoms  (problems such as increased thirst, urination, or weight gain). SEEK IMMEDIATE MEDICAL CARE IF:   You are vomiting or have diarrhea.  Your breath smells fruity.  You are breathing faster or slower.  You are very sleepy or incoherent.  You have numbness, tingling, or pain in your feet or hands.  You have chest pain.  Your symptoms get worse even though you have been following your caregiver's orders.  If you have any other questions or concerns. Document Released: 09/27/2000 Document Revised: 06/26/2011 Document Reviewed: 07/31/2011 [facility] Patient Information 2015 [place], [place]. This information is not intended to replace advice given to you by your health care provider. Make sure you discuss any questions you have with your health care provider.  Emergency Department Resource Guide 1) Find a Doctor and Pay Out of Pocket Although you won't have to find out who is covered by your insurance plan, it is a good idea to ask around and get recommendations. You will then need to call the office and see if the doctor you have chosen will accept you as a new patient and what types of options they offer for patients who are self-pay. Some doctors offer discounts or will set up payment plans for their patients who do not have insurance, but you will need to  ask so you aren't surprised when you get to your appointment.  2) Contact Your Local Health Department Not all health departments have doctors that can see patients for sick visits, but many do, so it is worth a call to see if yours does. If you don't know where your local health department is, you can check in your phone book. The CDC also has a tool to help you locate your state's health department, and many state websites also have listings of all of their local health departments.  3) Find a Walk-in Clinic If your illness is not likely to be very severe or complicated, you may want to try a walk in clinic. These are popping up all over  the country in pharmacies, drugstores, and shopping centers. They're usually staffed by nurse practitioners or physician assistants that have been trained to treat common illnesses and complaints. They're usually fairly quick and inexpensive. However, if you have serious medical issues or chronic medical problems, these are probably not your best option.  No Primary Care Doctor: - Call Health Connect at  [phone] - they can help you locate a primary care doctor that  accepts your insurance, provides certain services, etc. - Physician Referral Service- [phone]  Chronic Pain Problems: Organization         Address  Phone   Notes  [facility]  [phone] Patients need to be referred by their primary care doctor.   Medication Assistance: Organization         Address  Phone   Notes  [facility] Med[facility] [address] [place], [place] [address] [phone] --Must be a resident of [facility] -- Must have NO insurance coverage whatsoever (no Medicaid/ Medicare, etc.) -- The pt. MUST have a primary care doctor that directs their care regularly and follows them in the community   [facility]  [phone]   [employer]  [phone]    Agencies that provide inexpensive medical care: Organization         Address  Phone   Notes  [facility]  [phone]   [facility]    [phone]   [facility] [address] [place], [place] [address] [phone]   [facility] [address], [place] [phone]   Planned Parenthood    [phone]   [facility]    [phone]   [facility]  [address], [place] Phone:  [phone], Fax:  [phone] Hours of Operation:  9 am - 6 pm, M-F.  Also accepts Medicaid/Medicare and self-pay.  [facility]  [address], [place] Phone: [phone], Fax: [phone]. Hours of Operation:  8:30 am - 5:30 pm, M-F.  Also accepts Medicaid and self-pay.  [facility] [place] [address] [place] Phone: [phone]   [facility] [address] Natasha Bence [place], [place] [phone], Ext. [phone] Mondays & Thursdays: 7-9 AM.  First 15 patients are seen on a first come, first serve basis.    Medicaid-accepting [facility] Providers:  Organization         Address  Phone   Notes  [facility] [address],  [phone] Also accepts self-pay patients.  [facility] [address] [place], [address]  [phone])  [phone]   [facility] [address], [place] [phone]   [facility] [address] [place] [phone]   Renaye Rakers [address], [place]   [phone] Only accepts [place] patients after they have their name applied to their card.   Self-Pay (no insurance) in [facility]:  Organization         Address  Phone   Notes  Sickle Cell Patients, [facility] [address] [place], [place] [phone]   [facility] [address] [place], [place] [phone]   [facility] [place]  [address], [place] [phone]   [facility]/Dr. Osei-Bonsu  [address] [place] or [address], [place] [phone] Phone number for both [place] and [place] locations is the same.  [facility] [address] [place] [phone]   [facility] [address] [place] or [address] [phone]   [facility] [address], [place] [phone], phone; [phone], fax Sees patients 1st and 3rd Saturday of every month.   Must not qualify for public or private insurance (i.e. Medicaid, Medicare, [place] Health Choice, Veterans' Benefits)  Household income should be no more than 200% of the poverty level The clinic cannot treat you if you are pregnant or think you are pregnant  Sexually transmitted diseases are not treated at the clinic.    Dental Care: Organization         Address  Phone  Notes  [facility] Department of [facility] [address] [place], [place] [phone] Accepts children up to age 35 who are enrolled in [place] or [place] Health Choice; pregnant women with a Medicaid card; and children who have applied for Medicaid or [place] Health Choice, but were declined, whose parents can pay a reduced fee at time of service.  [facility] Department of [facility]  [address], [place] [phone] Accepts children up to age 34 who are enrolled in [place] or [place] Health Choice; pregnant women with a Medicaid card; and children who have applied for Medicaid or  Health Choice, but were declined, whose parents can pay a reduced fee at time of service.  [facility]  [address] [place], [place] [phone] Patients are seen by appointment only. Walk-ins are not accepted. [facility] will see patients 72 years of age and older. Monday - Tuesday (8am-5pm) Most Wednesdays (8:30-5pm) $30 per visit, cash only  [facility]  [address], [facility] [phone] Patients are seen by appointment only. Walk-ins are not accepted. [facility] will see patients 67 years of age and older. One Wednesday Evening (Monthly: Volunteer Based).  $30 per visit, cash only  [employer]  [phone] for adults; Children under age 59, call Graduate Pediatric Dentistry at [phone]. Children aged 62-14, please call [phone] to request a pediatric application.  Dental services are  provided in all areas of dental care including fillings, crowns and bridges, complete and partial dentures, implants, gum treatment, root canals, and extractions. Preventive care is also provided. Treatment is provided to both adults and children. Patients are selected via a lottery and there is often  a waiting list.   [facility] [address], [place]  [phone] [email or web address]   [facility] [address] [place], [place] [phone], Ext. [phone] Second and Fourth Thursday of each month, opens at 6:30 AM; Clinic ends at 9 AM.  Patients are seen on a first-come first-served basis, and a limited number are seen during each clinic.   [facility]  [address] Ether Griffins [place], [place] [phone]   Eligibility Requirements You must have lived in [place], [place], or [place] counties for at least the last three months.   You cannot be eligible for state or federal sponsored [place], including [employer], [place], or [employer].   You generally cannot be eligible for healthcare insurance through your employer.    How to apply: Eligibility screenings are held every Tuesday and Wednesday afternoon from 1:00 pm until 4:00 pm. You do not need an appointment for the interview!  [facility] [address], [place], [place] [phone]   [facility]  [phone]   [facility] Health Department  [phone]   [facility] Health Department  [phone]    Behavioral Health Resources in the Community: Intensive Outpatient Programs Organization         Address  Phone  Notes  [facility] [address] [place], [place] [phone]   [facility] Outpatient [address] [place], [place] [phone]   [facility] [address] [place], [place]  [phone]   [facility] Mental Health [address],  [place], [place]  [phone] or [phone]   Substance Abuse Resources Organization         Address  Phone  Notes  [facility]  [phone]   [facility]  [phone]   [facility]  [phone]   Floydene Flock  [phone]   Residential & Outpatient Substance Abuse Program  [phone]   Psychological Services Organization         Address  Phone  Notes  [facility]al Health  [phone]   [facility]  [phone]   [facility] Mental Health [address] [place] [phone] or [phone]    Mobile Crisis Teams Organization         Address  Phone  Notes  [facility], Mobile Crisis Care Unit  [phone]   [facility]  [address] [place], [place] [phone]   Doristine Locks [address] [place] [phone]    Self-Help/Support Groups Organization         Address  Phone             Notes  [facility] [place] - variety of support groups  336- I7437963 Call for more information  Narcotics Anonymous (NA), [facility] [address], [employer] [place]  2 meetings at this location   Statistician         Address  Phone  Notes  [facility] [address],    [place]  [phone]   [facility]  [address] [place] [address], [place], [place] [phone]   [facility] [address] [place], [place] [phone] Admissions: 8am-3pm M-F  [facility] [address]    [place], [place] [phone]   [facility] [address] [place], [place], [place] [phone]   [facility]  [facility] [address]  [place], [place] [phone]   [facility] - Intensive Outpatient [address], [place], [place] [phone]   [facility] (Addiction [facility] [address]  [place], [place] [phone] or  [phone]   [facility] ([facility]) [address] [place], [place] [phone] Accepts Medicaid  [facility] [address],  [place] [phone] Substance Abuse/Addiction Treatment   [facility] Organization         Address  Phone  Notes  [facility]  [phone])[phone]   Angie Fava, PhD [address] Ervin Knack [place], [place]   [phone] or [phone]   [facility] Behavioral   [address] [place], [place] [phone]   [facility] [address] [place], [place] [phone] Insurance/Medicaid/sponsorship through [facility] [address]                                    [place], [place] [phone] Therapy/tele-psych/case  [facility] [address], [place] [phone]    Dr. Lolly Mustache  [phone]   [facility]  [employer] [facility] Dept. 1) [address], [place] 2) [address], [place] 3)  [address], [place] [phone]  [phone]   [facility] Child Abuse Hotline 630-753-4746 or [phone] (After Hours)

## 2014-06-25 NOTE — Progress Notes (Signed)
EDCM attempted to call patient for follow up  on phone number [phone] without succes.  Unable to leave a voice message as this phone number has been disconnected.

## 2017-03-12 ENCOUNTER — Encounter (HOSPITAL_COMMUNITY): Payer: Self-pay | Admitting: Emergency Medicine

## 2017-03-12 ENCOUNTER — Other Ambulatory Visit: Payer: Self-pay

## 2017-03-12 ENCOUNTER — Inpatient Hospital Stay (HOSPITAL_COMMUNITY)
Admission: EM | Admit: 2017-03-12 | Discharge: 2017-03-14 | DRG: 638 | Disposition: A | Discharge status: Home / Self Care | Payer: Self-pay | Attending: Nephrology | Admitting: Nephrology

## 2017-03-12 DIAGNOSIS — N179 Acute kidney failure, unspecified: Secondary | ICD-10-CM | POA: Diagnosis present

## 2017-03-12 DIAGNOSIS — Z23 Encounter for immunization: Secondary | ICD-10-CM

## 2017-03-12 DIAGNOSIS — Z91128 Patient's intentional underdosing of medication regimen for other reason: Secondary | ICD-10-CM

## 2017-03-12 DIAGNOSIS — K3184 Gastroparesis: Secondary | ICD-10-CM | POA: Diagnosis present

## 2017-03-12 DIAGNOSIS — E1022 Type 1 diabetes mellitus with diabetic chronic kidney disease: Secondary | ICD-10-CM | POA: Diagnosis present

## 2017-03-12 DIAGNOSIS — E1010 Type 1 diabetes mellitus with ketoacidosis without coma: Principal | ICD-10-CM | POA: Diagnosis present

## 2017-03-12 DIAGNOSIS — E1110 Type 2 diabetes mellitus with ketoacidosis without coma: Secondary | ICD-10-CM | POA: Diagnosis present

## 2017-03-12 DIAGNOSIS — Z720 Tobacco use: Secondary | ICD-10-CM | POA: Diagnosis present

## 2017-03-12 DIAGNOSIS — E1069 Type 1 diabetes mellitus with other specified complication: Secondary | ICD-10-CM | POA: Diagnosis present

## 2017-03-12 DIAGNOSIS — F1210 Cannabis abuse, uncomplicated: Secondary | ICD-10-CM | POA: Diagnosis present

## 2017-03-12 DIAGNOSIS — A419 Sepsis, unspecified organism: Secondary | ICD-10-CM

## 2017-03-12 DIAGNOSIS — R6510 Systemic inflammatory response syndrome (SIRS) of non-infectious origin without acute organ dysfunction: Secondary | ICD-10-CM | POA: Diagnosis present

## 2017-03-12 DIAGNOSIS — R1013 Epigastric pain: Secondary | ICD-10-CM | POA: Diagnosis present

## 2017-03-12 DIAGNOSIS — F1020 Alcohol dependence, uncomplicated: Secondary | ICD-10-CM | POA: Diagnosis present

## 2017-03-12 DIAGNOSIS — F17210 Nicotine dependence, cigarettes, uncomplicated: Secondary | ICD-10-CM | POA: Diagnosis present

## 2017-03-12 DIAGNOSIS — E1043 Type 1 diabetes mellitus with diabetic autonomic (poly)neuropathy: Secondary | ICD-10-CM | POA: Diagnosis present

## 2017-03-12 DIAGNOSIS — T383X6A Underdosing of insulin and oral hypoglycemic [antidiabetic] drugs, initial encounter: Secondary | ICD-10-CM | POA: Diagnosis present

## 2017-03-12 DIAGNOSIS — F1910 Other psychoactive substance abuse, uncomplicated: Secondary | ICD-10-CM | POA: Diagnosis present

## 2017-03-12 DIAGNOSIS — X58XXXA Exposure to other specified factors, initial encounter: Secondary | ICD-10-CM | POA: Diagnosis present

## 2017-03-12 DIAGNOSIS — E875 Hyperkalemia: Secondary | ICD-10-CM | POA: Diagnosis present

## 2017-03-12 DIAGNOSIS — Z791 Long term (current) use of non-steroidal anti-inflammatories (NSAID): Secondary | ICD-10-CM

## 2017-03-12 DIAGNOSIS — F1410 Cocaine abuse, uncomplicated: Secondary | ICD-10-CM | POA: Diagnosis present

## 2017-03-12 DIAGNOSIS — N182 Chronic kidney disease, stage 2 (mild): Secondary | ICD-10-CM | POA: Diagnosis present

## 2017-03-12 DIAGNOSIS — E860 Dehydration: Secondary | ICD-10-CM | POA: Diagnosis present

## 2017-03-12 HISTORY — DX: Other psychoactive substance abuse, uncomplicated: F19.10

## 2017-03-12 HISTORY — DX: Chronic kidney disease, stage 2 (mild): N18.2

## 2017-03-12 LAB — CBG MONITORING, ED: Glucose-Capillary: >600 mg/dL (ref 65–99)

## 2017-03-12 NOTE — ED Triage Notes (Signed)
Pt reports having vomiting and weakness. Pt reports hx of DM type 1 and has not checked his glucose levels or taken insulin.

## 2017-03-13 ENCOUNTER — Telehealth: Payer: Self-pay

## 2017-03-13 ENCOUNTER — Encounter (HOSPITAL_COMMUNITY): Payer: Self-pay | Admitting: Internal Medicine

## 2017-03-13 ENCOUNTER — Inpatient Hospital Stay (HOSPITAL_COMMUNITY): Payer: Self-pay

## 2017-03-13 DIAGNOSIS — E1069 Type 1 diabetes mellitus with other specified complication: Secondary | ICD-10-CM | POA: Diagnosis present

## 2017-03-13 DIAGNOSIS — R1013 Epigastric pain: Secondary | ICD-10-CM

## 2017-03-13 DIAGNOSIS — N179 Acute kidney failure, unspecified: Secondary | ICD-10-CM | POA: Diagnosis present

## 2017-03-13 DIAGNOSIS — N182 Chronic kidney disease, stage 2 (mild): Secondary | ICD-10-CM

## 2017-03-13 DIAGNOSIS — Z720 Tobacco use: Secondary | ICD-10-CM | POA: Diagnosis present

## 2017-03-13 DIAGNOSIS — F1021 Alcohol dependence, in remission: Secondary | ICD-10-CM

## 2017-03-13 DIAGNOSIS — R6510 Systemic inflammatory response syndrome (SIRS) of non-infectious origin without acute organ dysfunction: Secondary | ICD-10-CM

## 2017-03-13 DIAGNOSIS — E875 Hyperkalemia: Secondary | ICD-10-CM | POA: Diagnosis present

## 2017-03-13 LAB — CBC
HEMATOCRIT: 43.7 % (ref 39.0–52.0)
Hemoglobin: 14.7 g/dL (ref 13.0–17.0)
MCH: 32.2 pg (ref 26.0–34.0)
MCHC: 33.6 g/dL (ref 30.0–36.0)
MCV: 95.6 fL (ref 78.0–100.0)
Platelets: 371 10*3/uL (ref 150–400)
RBC: 4.57 MIL/uL (ref 4.22–5.81)
RDW: 13.6 % (ref 11.5–15.5)
WBC: 28.5 10*3/uL — AB (ref 4.0–10.5)

## 2017-03-13 LAB — BASIC METABOLIC PANEL
ANION GAP: 5 (ref 5–15)
ANION GAP: 7 (ref 5–15)
ANION GAP: 33 — AB (ref 5–15)
Anion gap: 20 — ABNORMAL HIGH (ref 5–15)
Anion gap: 8 (ref 5–15)
BUN: 34 mg/dL — ABNORMAL HIGH (ref 6–20)
BUN: 25 mg/dL — ABNORMAL HIGH (ref 6–20)
BUN: 24 mg/dL — ABNORMAL HIGH (ref 6–20)
BUN: 19 mg/dL (ref 6–20)
BUN: 39 mg/dL — AB (ref 6–20)
CALCIUM: 9.4 mg/dL (ref 8.9–10.3)
CHLORIDE: 108 mmol/L (ref 101–111)
CHLORIDE: 118 mmol/L — AB (ref 101–111)
CHLORIDE: 117 mmol/L — AB (ref 101–111)
CHLORIDE: 114 mmol/L — AB (ref 101–111)
CO2: 11 mmol/L — AB (ref 22–32)
CO2: 19 mmol/L — AB (ref 22–32)
CO2: 21 mmol/L — AB (ref 22–32)
CO2: 22 mmol/L (ref 22–32)
CO2: 9 mmol/L — ABNORMAL LOW (ref 22–32)
CREATININE: 1.72 mg/dL — AB (ref 0.61–1.24)
CREATININE: 1.15 mg/dL (ref 0.61–1.24)
Calcium: 7.2 mg/dL — ABNORMAL LOW (ref 8.9–10.3)
Calcium: 7.1 mg/dL — ABNORMAL LOW (ref 8.9–10.3)
Calcium: 7.6 mg/dL — ABNORMAL LOW (ref 8.9–10.3)
Calcium: 7.7 mg/dL — ABNORMAL LOW (ref 8.9–10.3)
Chloride: 90 mmol/L — ABNORMAL LOW (ref 101–111)
Creatinine, Ser: 1.04 mg/dL (ref 0.61–1.24)
Creatinine, Ser: 1.13 mg/dL (ref 0.61–1.24)
Creatinine, Ser: 2.12 mg/dL — ABNORMAL HIGH (ref 0.61–1.24)
GFR calc Af Amer: 42 mL/min — ABNORMAL LOW (ref >60)
GFR calc non Af Amer: 47 mL/min — ABNORMAL LOW (ref >60)
GFR calc non Af Amer: >60 mL/min (ref >60)
GFR calc non Af Amer: >60 mL/min (ref >60)
GFR calc non Af Amer: >60 mL/min (ref >60)
GFR, EST AFRICAN AMERICAN: 54 mL/min — AB (ref >60)
GFR, EST NON AFRICAN AMERICAN: 36 mL/min — AB (ref >60)
Glucose, Bld: 115 mg/dL — ABNORMAL HIGH (ref 65–99)
Glucose, Bld: 146 mg/dL — ABNORMAL HIGH (ref 65–99)
Glucose, Bld: 245 mg/dL — ABNORMAL HIGH (ref 65–99)
Glucose, Bld: 517 mg/dL (ref 65–99)
Glucose, Bld: 748 mg/dL (ref 65–99)
POTASSIUM: 3.5 mmol/L (ref 3.5–5.1)
POTASSIUM: 3.6 mmol/L (ref 3.5–5.1)
POTASSIUM: 3.7 mmol/L (ref 3.5–5.1)
POTASSIUM: 4.2 mmol/L (ref 3.5–5.1)
POTASSIUM: 6.1 mmol/L — AB (ref 3.5–5.1)
SODIUM: 143 mmol/L (ref 135–145)
SODIUM: 132 mmol/L — AB (ref 135–145)
Sodium: 139 mmol/L (ref 135–145)
Sodium: 145 mmol/L (ref 135–145)
Sodium: 143 mmol/L (ref 135–145)

## 2017-03-13 LAB — CBG MONITORING, ED
GLUCOSE-CAPILLARY: 575 mg/dL — AB (ref 65–99)
GLUCOSE-CAPILLARY: 528 mg/dL — AB (ref 65–99)
GLUCOSE-CAPILLARY: 408 mg/dL — AB (ref 65–99)
GLUCOSE-CAPILLARY: 352 mg/dL — AB (ref 65–99)
GLUCOSE-CAPILLARY: 260 mg/dL — AB (ref 65–99)
GLUCOSE-CAPILLARY: 187 mg/dL — AB (ref 65–99)
GLUCOSE-CAPILLARY: 141 mg/dL — AB (ref 65–99)
GLUCOSE-CAPILLARY: 111 mg/dL — AB (ref 65–99)
Glucose-Capillary: 275 mg/dL — ABNORMAL HIGH (ref 65–99)
Glucose-Capillary: 116 mg/dL — ABNORMAL HIGH (ref 65–99)
Glucose-Capillary: 108 mg/dL — ABNORMAL HIGH (ref 65–99)

## 2017-03-13 LAB — I-STAT CHEM 8, ED
BUN: 43 mg/dL — AB (ref 6–20)
CALCIUM ION: 1.15 mmol/L (ref 1.15–1.40)
CHLORIDE: 97 mmol/L — AB (ref 101–111)
CREATININE: 1.6 mg/dL — AB (ref 0.61–1.24)
HCT: 49 % (ref 39.0–52.0)
Hemoglobin: 16.7 g/dL (ref 13.0–17.0)
POTASSIUM: 6.1 mmol/L — AB (ref 3.5–5.1)
SODIUM: 131 mmol/L — AB (ref 135–145)
TCO2: 13 mmol/L — ABNORMAL LOW (ref 22–32)

## 2017-03-13 LAB — BLOOD GAS, VENOUS
ACID-BASE DEFICIT: 13.5 mmol/L — AB (ref 0.0–2.0)
BICARBONATE: 14 mmol/L — AB (ref 20.0–28.0)
O2 SAT: 54 %
PATIENT TEMPERATURE: 98.6
PH VEN: 7.19 — AB (ref 7.250–7.430)
pCO2, Ven: 38 mmHg — ABNORMAL LOW (ref 44.0–60.0)
pO2, Ven: 33.8 mmHg (ref 32.0–45.0)

## 2017-03-13 LAB — RAPID URINE DRUG SCREEN, HOSP PERFORMED
AMPHETAMINES: NOT DETECTED
Barbiturates: NOT DETECTED
Benzodiazepines: NOT DETECTED
COCAINE: POSITIVE — AB
OPIATES: POSITIVE — AB
TETRAHYDROCANNABINOL: POSITIVE — AB

## 2017-03-13 LAB — URINALYSIS, ROUTINE W REFLEX MICROSCOPIC
BACTERIA UA: NONE SEEN
BILIRUBIN URINE: NEGATIVE
HGB URINE DIPSTICK: NEGATIVE
Ketones, ur: 80 mg/dL — AB
LEUKOCYTES UA: NEGATIVE
NITRITE: NEGATIVE
PH: 5 (ref 5.0–8.0)
Protein, ur: NEGATIVE mg/dL
RBC / HPF: NONE SEEN RBC/hpf (ref 0–5)
SPECIFIC GRAVITY, URINE: 1.018 (ref 1.005–1.030)
WBC, UA: NONE SEEN WBC/hpf (ref 0–5)

## 2017-03-13 LAB — CREATININE, URINE, RANDOM: Creatinine, Urine: 40.11 mg/dL

## 2017-03-13 LAB — GLUCOSE, CAPILLARY
GLUCOSE-CAPILLARY: 109 mg/dL — AB (ref 65–99)
Glucose-Capillary: 181 mg/dL — ABNORMAL HIGH (ref 65–99)

## 2017-03-13 LAB — INFLUENZA PANEL BY PCR (TYPE A & B)
INFLBPCR: NEGATIVE
Influenza A By PCR: NEGATIVE

## 2017-03-13 LAB — RAPID STREP SCREEN (MED CTR MEBANE ONLY): STREPTOCOCCUS, GROUP A SCREEN (DIRECT): NEGATIVE

## 2017-03-13 LAB — LIPASE, BLOOD: LIPASE: 48 U/L (ref 11–51)

## 2017-03-13 LAB — HEMOGLOBIN A1C
HEMOGLOBIN A1C: 11.9 % — AB (ref 4.8–5.6)
Mean Plasma Glucose: 294.83 mg/dL

## 2017-03-13 LAB — PROCALCITONIN: PROCALCITONIN: 0.91 ng/mL

## 2017-03-13 LAB — LACTIC ACID, PLASMA: Lactic Acid, Venous: 2.3 mmol/L (ref 0.5–1.9)

## 2017-03-13 LAB — HIV ANTIBODY (ROUTINE TESTING W REFLEX): HIV Screen 4th Generation wRfx: NONREACTIVE

## 2017-03-13 LAB — SODIUM, URINE, RANDOM: Sodium, Ur: 44 mmol/L

## 2017-03-13 MED ORDER — MENTHOL 3 MG MT LOZG
1.0000 | LOZENGE | OROMUCOSAL | Status: DC | PRN
  Administered 2017-03-13: 3 mg via ORAL
  Filled 2017-03-13: qty 9

## 2017-03-13 MED ORDER — METOCLOPRAMIDE HCL 5 MG/ML IJ SOLN
5.0000 mg | Freq: Three times a day (TID) | INTRAMUSCULAR | Status: DC
  Administered 2017-03-13 – 2017-03-14 (×3): 5 mg via INTRAVENOUS
  Filled 2017-03-13 (×3): qty 2

## 2017-03-13 MED ORDER — FOLIC ACID 1 MG PO TABS
1.0000 mg | ORAL_TABLET | Freq: Every day | ORAL | Status: DC
  Administered 2017-03-13 – 2017-03-14 (×2): 1 mg via ORAL
  Filled 2017-03-13 (×2): qty 1

## 2017-03-13 MED ORDER — SODIUM CHLORIDE 0.9 % IV SOLN: INTRAVENOUS | Status: DC

## 2017-03-13 MED ORDER — LORAZEPAM 1 MG PO TABS: 1.0000 mg | ORAL_TABLET | Freq: Four times a day (QID) | ORAL | Status: DC | PRN

## 2017-03-13 MED ORDER — SODIUM CHLORIDE 0.9 % IV SOLN
INTRAVENOUS | Status: DC
  Administered 2017-03-13: 5.4 [IU]/h via INTRAVENOUS
  Filled 2017-03-13 (×2): qty 1

## 2017-03-13 MED ORDER — SODIUM CHLORIDE 0.9 % IV BOLUS (SEPSIS)
2000.0000 mL | Freq: Once | INTRAVENOUS | Status: AC
  Administered 2017-03-13: 2000 mL via INTRAVENOUS

## 2017-03-13 MED ORDER — THIAMINE HCL 100 MG/ML IJ SOLN: 100.0000 mg | Freq: Every day | INTRAMUSCULAR | Status: DC

## 2017-03-13 MED ORDER — NICOTINE 21 MG/24HR TD PT24
21.0000 mg | MEDICATED_PATCH | Freq: Every day | TRANSDERMAL | Status: DC
  Administered 2017-03-13 – 2017-03-14 (×2): 21 mg via TRANSDERMAL
  Filled 2017-03-13 (×2): qty 1

## 2017-03-13 MED ORDER — VITAMIN B-1 100 MG PO TABS
100.0000 mg | ORAL_TABLET | Freq: Every day | ORAL | Status: DC
  Administered 2017-03-13 – 2017-03-14 (×2): 100 mg via ORAL
  Filled 2017-03-13 (×2): qty 1

## 2017-03-13 MED ORDER — SODIUM CHLORIDE 0.9 % IV SOLN
INTRAVENOUS | Status: DC
  Administered 2017-03-13 (×2): via INTRAVENOUS

## 2017-03-13 MED ORDER — INSULIN ASPART 100 UNIT/ML ~~LOC~~ SOLN
3.0000 [IU] | Freq: Three times a day (TID) | SUBCUTANEOUS | Status: DC
  Administered 2017-03-14: 3 [IU] via SUBCUTANEOUS

## 2017-03-13 MED ORDER — ENOXAPARIN SODIUM 40 MG/0.4ML ~~LOC~~ SOLN
40.0000 mg | SUBCUTANEOUS | Status: DC
  Administered 2017-03-13: 40 mg via SUBCUTANEOUS
  Filled 2017-03-13: qty 0.4

## 2017-03-13 MED ORDER — MORPHINE SULFATE (PF) 4 MG/ML IV SOLN
4.0000 mg | Freq: Once | INTRAVENOUS | Status: AC
  Administered 2017-03-13: 4 mg via INTRAVENOUS
  Filled 2017-03-13: qty 1

## 2017-03-13 MED ORDER — FAMOTIDINE IN NACL 20-0.9 MG/50ML-% IV SOLN
20.0000 mg | Freq: Two times a day (BID) | INTRAVENOUS | Status: DC
Start: 2017-03-13 — End: 2017-03-14
  Administered 2017-03-13 – 2017-03-14 (×4): 20 mg via INTRAVENOUS
  Filled 2017-03-13 (×4): qty 50

## 2017-03-13 MED ORDER — ADULT MULTIVITAMIN W/MINERALS CH
1.0000 | ORAL_TABLET | Freq: Every day | ORAL | Status: DC
  Administered 2017-03-13 – 2017-03-14 (×2): 1 via ORAL
  Filled 2017-03-13 (×2): qty 1

## 2017-03-13 MED ORDER — DEXTROSE-NACL 5-0.45 % IV SOLN: INTRAVENOUS | Status: DC

## 2017-03-13 MED ORDER — ONDANSETRON HCL 4 MG/2ML IJ SOLN
4.0000 mg | Freq: Once | INTRAMUSCULAR | Status: AC
  Administered 2017-03-13: 4 mg via INTRAVENOUS
  Filled 2017-03-13: qty 2

## 2017-03-13 MED ORDER — SODIUM CHLORIDE 0.9 % IV BOLUS (SEPSIS)
1000.0000 mL | Freq: Once | INTRAVENOUS | Status: AC
  Administered 2017-03-13: 1000 mL via INTRAVENOUS

## 2017-03-13 MED ORDER — INFLUENZA VAC SPLIT QUAD 0.5 ML IM SUSY
0.5000 mL | PREFILLED_SYRINGE | INTRAMUSCULAR | Status: AC
  Administered 2017-03-14: 0.5 mL via INTRAMUSCULAR
  Filled 2017-03-13: qty 0.5

## 2017-03-13 MED ORDER — INSULIN ASPART 100 UNIT/ML ~~LOC~~ SOLN: 0.0000 [IU] | Freq: Every day | SUBCUTANEOUS | Status: DC

## 2017-03-13 MED ORDER — INSULIN GLARGINE 100 UNIT/ML ~~LOC~~ SOLN
10.0000 [IU] | Freq: Two times a day (BID) | SUBCUTANEOUS | Status: DC
Start: 2017-03-13 — End: 2017-03-14
  Administered 2017-03-13 – 2017-03-14 (×3): 10 [IU] via SUBCUTANEOUS
  Filled 2017-03-13 (×4): qty 0.1

## 2017-03-13 MED ORDER — LORAZEPAM 2 MG/ML IJ SOLN
0.0000 mg | Freq: Four times a day (QID) | INTRAMUSCULAR | Status: DC
Start: 2017-03-13 — End: 2017-03-14

## 2017-03-13 MED ORDER — PHENOL 1.4 % MT LIQD
1.0000 | OROMUCOSAL | Status: DC | PRN
  Administered 2017-03-13: 1 via OROMUCOSAL
  Filled 2017-03-13: qty 177

## 2017-03-13 MED ORDER — ONDANSETRON HCL 4 MG PO TABS: 4.0000 mg | ORAL_TABLET | Freq: Once | ORAL | Status: DC

## 2017-03-13 MED ORDER — DEXTROSE-NACL 5-0.45 % IV SOLN
INTRAVENOUS | Status: DC
  Administered 2017-03-13: 09:00:00 via INTRAVENOUS

## 2017-03-13 MED ORDER — INSULIN ASPART 100 UNIT/ML ~~LOC~~ SOLN
0.0000 [IU] | Freq: Three times a day (TID) | SUBCUTANEOUS | Status: DC
  Administered 2017-03-14 (×2): 3 [IU] via SUBCUTANEOUS

## 2017-03-13 MED ORDER — LORAZEPAM 2 MG/ML IJ SOLN
0.0000 mg | Freq: Two times a day (BID) | INTRAMUSCULAR | Status: DC
Start: 2017-03-15 — End: 2017-03-14

## 2017-03-13 MED ORDER — ONDANSETRON HCL 4 MG/2ML IJ SOLN: 4.0000 mg | Freq: Three times a day (TID) | INTRAMUSCULAR | Status: DC | PRN

## 2017-03-13 MED ORDER — LORAZEPAM 2 MG/ML IJ SOLN: 1.0000 mg | Freq: Four times a day (QID) | INTRAMUSCULAR | Status: DC | PRN

## 2017-03-13 NOTE — H&P (Signed)
History and Physical    [name] ZOX:[id number] DOB: 29-Sep-1975 DOA: 03/12/2017  Referring MD/NP/PA:   PCP: Patient, No Pcp Per   Patient coming from:  The patient is coming from home.  At baseline, pt is independent for most of ADL.   Chief Complaint: Generalized weakness, nausea, vomiting, epigastric abdominal pain, sore throat   HPI: [name] is a 41 y.o. male with medical history significant of diabetes type 1, insulin noncompliance, polysubstance abuse (tobacco, alcohol, cocaine, marijuana), CKD-2, who presents with generalized weakness, nausea, vomiting, epigastric abdominal pain and sore throat.  Patient states that his Lantus insulin was switched to Humalog mixed insulin recently, but he has not taken his insulin recently. He developed generalized weakness. He also has nausea, vomiting and epigastric abdominal pain. He vomited several times since last night. His epigastric abdominal pain is constant, 6 out of 10 in severity, nonradiating, sharp. Denies diarrhea. He also reports sore throat, and mild runny nose, but no cough, chest pain. Denies fever or chills. Denies symptoms of UTI or unilateral weakness. He sates that he did not check blood sugar, but thinks that his sugar is likely elevated.  ED Course: pt was found to have DKA with CBG 740, bicarbonate 9, anion gap 33, potassium 6.1, WBC 28.5, negative urinalysis, worsening renal function, temperature 99.1, tachycardia, tachypnea, oxygen saturation 100% on room air. Patient is admitted to stepdown as inpatient.  Review of Systems:   General: no fevers, chills, has poor appetite, has fatigue HEENT: no blurry vision, hearing changes. has sore throat and running  Nose. Respiratory: no dyspnea, coughing, wheezing CV: no chest pain, no palpitations GI: has nausea, vomiting, abdominal pain, no diarrhea, constipation GU: no dysuria, burning on urination, increased urinary frequency, hematuria  Ext: no leg edema Neuro:  no unilateral weakness, numbness, or tingling, no vision change or hearing loss Skin: no rash, no skin tear. MSK: No muscle spasm, no deformity, no limitation of range of movement in spin Heme: No easy bruising.  Travel history: No recent long distant travel.  Allergy:  Allergies  Allergen Reactions  . Shellfish Allergy Anaphylaxis    Past Medical History:  Diagnosis Date  . CKD (chronic kidney disease), stage II   . Diabetes mellitus without complication ([place])    type 1  . Polysubstance abuse [facility])     Past Surgical History:  Procedure Laterality Date  . DENTAL SURGERY      Social History:  reports that he has been smoking cigarettes.  He has a 5.00 pack-year smoking history. he has never used smokeless tobacco. He reports that he drinks about 24.0 oz of alcohol per week. He reports that he uses drugs. Drugs: Marijuana and Cocaine. Frequency: 5.00 times per week.  Family History:  Family History  Problem Relation Age of Onset  . Arthritis Mother   . Diabetes Mellitus II Father      Prior to Admission medications   Medication Sig Start Date End Date Taking? Authorizing Provider  ibuprofen (ADVIL,MOTRIN) 200 MG tablet Take 800 mg by mouth every 6 (six) hours as needed for mild pain or moderate pain.   Yes [provider]  insulin glargine (LANTUS) 100 UNIT/ML injection Inject 30 Units into the skin at bedtime.   Yes [provider]  insulin lispro protamine-lispro (HUMALOG 75/25 MIX) (75-25) 100 UNIT/ML SUSP injection Inject 0-30 Units into the skin 2 (two) times daily with a meal. Per sliding scale   Yes [provider]  Blood Glucose Monitoring Suppl (  BLOOD GLUCOSE METER KIT AND SUPPLIES) Dispense based on patient and insurance preference. Use up to four times daily as directed. (FOR ICD-9 250.00, 250.01). 12/30/13   Janece Canterbury, MD  glucose blood test strip Use as instructed 06/23/14   Antonietta Breach, PA-C    Physical Exam: Vitals:   03/12/17  2259  BP: 125/73  Pulse: (!) 124  Resp: 20  Temp: 99.1 F (37.3 C)  TempSrc: Oral  SpO2: 100%  Weight: 58.5 kg (129 lb)  Height: 5' 7.5" (1.715 m)   General: Not in acute distress HEENT:       Eyes: PERRL, EOMI, no scleral icterus.       ENT: No discharge from the ears and nose, has mild pharynx injection, no tonsillar enlargement.        Neck: No JVD, no bruit, no mass felt. Heme: No neck lymph node enlargement. Cardiac: S1/S2, RRR, No murmurs, No gallops or rubs. Respiratory:  No rales, wheezing, rhonchi or rubs. GI: Soft, nondistended, has tenderness in epigastric area, no rebound pain, no organomegaly, BS present. GU: No hematuria Ext: No pitting leg edema bilaterally. 2+DP/PT pulse bilaterally. Musculoskeletal: No joint deformities, No joint redness or warmth, no limitation of ROM in spin. Skin: No rashes.  Neuro: Alert, oriented X3, cranial nerves II-XII grossly intact, moves all extremities normally.  Psych: Patient is not psychotic, no suicidal or hemocidal ideation.  Labs on Admission: I have personally reviewed following labs and imaging studies  CBC: Recent Labs  Lab 03/12/17 2344 03/13/17 0037  WBC 28.5*  --   HGB 14.7 16.7  HCT 43.7 49.0  MCV 95.6  --   PLT 371  --    Basic Metabolic Panel: Recent Labs  Lab 03/12/17 2344 03/13/17 0037  NA 132* 131*  K 6.1* 6.1*  CL 90* 97*  CO2 9*  --   GLUCOSE 748* >700*  BUN 39* 43*  CREATININE 2.12* 1.60*  CALCIUM 9.4  --    GFR: Estimated Creatinine Clearance: 49.3 mL/min (A) (by C-G formula based on SCr of 1.6 mg/dL (H)). Liver Function Tests: No results for input(s): AST, ALT, ALKPHOS, BILITOT, PROT, ALBUMIN in the last 168 hours. No results for input(s): LIPASE, AMYLASE in the last 168 hours. No results for input(s): AMMONIA in the last 168 hours. Coagulation Profile: No results for input(s): INR, PROTIME in the last 168 hours. Cardiac Enzymes: No results for input(s): CKTOTAL, CKMB, CKMBINDEX,  TROPONINI in the last 168 hours. BNP (last 3 results) No results for input(s): PROBNP in the last 8760 hours. HbA1C: No results for input(s): HGBA1C in the last 72 hours. CBG: Recent Labs  Lab 03/12/17 2328 03/13/17 0111 03/13/17 0220  GLUCAP >600* >600* 575*   Lipid Profile: No results for input(s): CHOL, HDL, LDLCALC, TRIG, CHOLHDL, LDLDIRECT in the last 72 hours. Thyroid Function Tests: No results for input(s): TSH, T4TOTAL, FREET4, T3FREE, THYROIDAB in the last 72 hours. Anemia Panel: No results for input(s): VITAMINB12, FOLATE, FERRITIN, TIBC, IRON, RETICCTPCT in the last 72 hours. Urine analysis:    Component Value Date/Time   COLORURINE YELLOW 03/12/2017 2329   APPEARANCEUR CLEAR 03/12/2017 2329   LABSPEC 1.018 03/12/2017 2329   PHURINE 5.0 03/12/2017 2329   GLUCOSEU >=500 (A) 03/12/2017 2329   HGBUR NEGATIVE 03/12/2017 2329   BILIRUBINUR NEGATIVE 03/12/2017 2329   KETONESUR 80 (A) 03/12/2017 2329   PROTEINUR NEGATIVE 03/12/2017 2329   UROBILINOGEN 0.2 12/27/2013 2325   NITRITE NEGATIVE 03/12/2017 2329   LEUKOCYTESUR NEGATIVE 03/12/2017 2329  Sepsis Labs: @LABRCNTIP (procalcitonin:4,lacticidven:4) )No results found for this or any previous visit (from the past 240 hour(s)).   Radiological Exams on Admission: Dg Chest Port 1 View  Result Date: 03/13/2017 CLINICAL DATA:  Sepsis EXAM: PORTABLE CHEST 1 VIEW COMPARISON:  Chest radiograph 12/27/2013 FINDINGS: The heart size and mediastinal contours are within normal limits. Both lungs are clear. The visualized skeletal structures are unremarkable. IMPRESSION: No active disease. Electronically Signed   By: Ulyses Jarred M.D.   On: 03/13/2017 02:36     EKG: Independently reviewed.  Sinus rhythm, QTC 477, RAD, T wave peaking in V3-V5.  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 This is due to noncompliance. Mental status normal.  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 Patient is noncompliance to insulin -on DAK protocol now -Check A1c -consult to diabetic educator and case manage  SIRS: Patient meets criteria for SIRS with leukocytosis, tachycardia and tachypnea. No source of infection identified. Urinalysis negative. Patient does not have cough or SOB. Less likely to have pneumonia. Patient states that he has sore throat and mild runny nose, will need to rule out flu and strep throat. -will get Procalcitonin and trend lactic acid levels per sepsis protocol. -IVF: 3L of NS bolus in ED, followed by 125 cc/h  -blood culture x 2 - f/u Flu pcr and rapid strep screen -Cepacol for sore throat  Hyperkalemia: K=6.1 -expect to be corrected with Insulin gtt and IVF  AoCKD-II: Baseline Cre is 1.1-1.2, pt's Cre is 2.12 and BUN43 on admission. Likely due to prerenal secondary to dehydration and continuation of NSAIDs. - IVF as above - Check FeNa - Follow up renal function by BMP - Hold Ibuprofen  Nausea, vomiting, epigastric abdominal pain: likely due to combination of possible gastroparesis secondary to poorly controlled diabetes and DKA. No acute abdomen on physical examination. -Check lipase -Reglan 5 mg 3 times a day -When necessary Zofran -IV fluid as above  Polysubstance abuse: including tobacco, alcohol, cocaine and marijuana: -Did counseling about importance  of quitting substance use -Nicotine patch -CiWA protocol -Check UDS   DVT ppx: SQ Lovenox Code Status: Full code Family Communication: None at bed side.    Disposition Plan:  Anticipate discharge back to previous home environment Consults called:  none Admission status:   SDU/inpation       Date of Service 03/13/2017    Ivor Costa [facility] Pager [phone]  If 7PM-7AM, please contact night-coverage [email or web address] Password [email or web address] 03/13/2017, 2:41 AM

## 2017-03-13 NOTE — ED Notes (Addendum)
Date and time results received: 03/13/17 0028  Test: Glucose Critical Value: 748  Name of Provider Notified: Audry Pili, PA-C Orders Received? Or Actions Taken?:

## 2017-03-13 NOTE — Progress Notes (Signed)
Inpatient Diabetes Program Recommendations  AACE/ADA: New Consensus Statement on Inpatient Glycemic Control (2015)  Target Ranges:  Prepandial:   less than 140 mg/dL      Peak postprandial:   less than 180 mg/dL (1-2 hours)      Critically ill patients:  140 - 180 mg/dL   Lab Results  Component Value Date   GLUCAP 109 (H) 03/13/2017   HGBA1C 11.9 (H) 03/13/2017    Review of Glycemic Control  Diabetes history: DM1 Outpatient Diabetes medications: Lantus 30 units QHS, 75/25 - 0-30 units bid Current orders for Inpatient glycemic control: Lantus 10 units bid, Novolog 0-15 units tidwc and hs + 3 units tidwc  Spoke with pt regarding his glucose control at home. States he doesn't have MD, no monitoring at home, and has problems with obtaining food. Previously on Lantus and Novolog and ran out. Friend gave him 75/25 and states "I just didn't take enough." Needs PCP - [facility]. Will need to get prescriptions filled at [facility] when discharged. Case manager consult for PCP, medication assistance. Social work consult for substance abuse program at discharge.  Recommendations:  May need to lower Novolog to 0-9 units tidwc and hs since Type 1 and sensitive to insulin.  Will follow daily.  Thank you. Ailene Ards, RD, LDN, CDE Inpatient Diabetes Coordinator [phone]

## 2017-03-13 NOTE — Care Management Note (Signed)
Case Management Note  CM consulted for medication assistance, Noted no ins and no pcp.  Put information on AVS for follow up with the [facility] to establish PCP care.  Also put the [facility] information on the AVS for pharmacy and financial counseling needs.  [facility] does not have any available appointments but can be used for the pharmacy.  Will send a note to CM, Erskine Squibb with the indigent clinics to follow pt.  CM will follow as needed.

## 2017-03-13 NOTE — Clinical Social Work Note (Signed)
Clinical Social Work Assessment  Patient Details  Name: [name] MRN: [id number] Date of Birth: 11/27/1975  Date of referral:  03/13/17               Reason for consult:  Insurance Barriers                Permission sought to share information with:    Permission granted to share information::     Name::        Agency::     Relationship::     Contact Information:     Housing/Transportation Living arrangements for the past 2 months:  Single Family Home Source of Information:  Patient Patient Interpreter Needed:  None Criminal Activity/Legal Involvement Pertinent to Current Situation/Hospitalization:    Significant Relationships:  None Lives with:  Self Do you feel safe going back to the place where you live?  Yes Need for family participation in patient care:     Care giving concerns:  Pt reports having vomiting and weakness. Pt reports hx of DM type 1 and has not checked his glucose levels or taken insulin. Patient also has no insurance.   Social Worker assessment / plan:  CSW intern met with patient via bedside to discuss insurance and financial concerns. Patient informed writer that he was currently unemployed, but looking for a job. Writer asked patient if he had applied for medicaid and he had not. Writer left a medicaid application with patient and explained the document.   Patient appeared motivated to fill out the medicaid application.  Employment status:  Unemployed Insurance information:  Other (Comment Required)(No insurance) PT Recommendations:  Not assessed at this time Information / Referral to community resources:     Patient/Family's Response to care:  Patient was receptive to care, and exemplified appropriate behavior during discourse.   Patient/Family's Understanding of and Emotional Response to Diagnosis, Current Treatment, and Prognosis:  Patient was understanding of current diagnosis and financial standing. Patient is proactive and motivated to find a  job and fill out the medicaid application.  Emotional Assessment Appearance:  Appears older than stated age Attitude/Demeanor/Rapport:    Affect (typically observed):  Accepting, Withdrawn, Flat Orientation:  Oriented to Self, Oriented to Situation, Oriented to Place, Oriented to  Time Alcohol / Substance use:    Psych involvement (Current and /or in the community):     Discharge Needs  Concerns to be addressed:  Financial / Insurance Concerns Readmission within the last 30 days:  No Current discharge risk:  None Barriers to Discharge:  No Barriers Identified   Kayla R Blanton, Student-Social Work 03/13/2017, 12:44 PM  

## 2017-03-13 NOTE — ED Notes (Signed)
Report given to [facility] [id number].

## 2017-03-13 NOTE — ED Provider Notes (Signed)
[place] DEPT Provider Note   CSN: [id number] Arrival date & time: 03/12/17  2235     History   Chief Complaint Chief Complaint  Patient presents with  . Emesis  . Hyperglycemia    HPI [name] is a 41 y.o. male.  HPI  41 y.o. male with a hx of DM Type 1, presents to the Emergency Department today due to N/V. Noes hx same with DKA. States that he was recently changed from Lantus and Novolog to Humolog x several weeks ago. Notes non compliance with medication with lack of glucose checks. States he took Humalog if he "felt" like his blood sugar was high. No chest pressure with emesis. No SOB. No fevers. Notes epigastric abdominal pain. Rates pain 6/10. Cramping. No headache. No neck stiffness. Unsure of blodo glucose. No other symptoms noted    Past Medical History:  Diagnosis Date  . Diabetes mellitus without complication ([place])    type 1    Patient Active Problem List   Diagnosis Date Noted  . Neuropathy 12/28/2013  . Substance abuse ([place]) 12/28/2013  . Alcohol dependence ([place]) 12/28/2013  . Abdominal pain, epigastric 12/28/2013  . Protein-calorie malnutrition, severe ([place]) 12/28/2013  . DKA (diabetic ketoacidoses) ([place]) 12/27/2013    History reviewed. No pertinent surgical history.     Home Medications    Prior to Admission medications   Medication Sig Start Date End Date Taking? Authorizing Provider  Blood Glucose Monitoring Suppl (BLOOD GLUCOSE METER KIT AND SUPPLIES) Dispense based on patient and insurance preference. Use up to four times daily as directed. (FOR ICD-9 250.00, 250.01). 12/30/13   Janece Canterbury, MD  glucose blood test strip Use as instructed 06/23/14   Antonietta Breach, PA-C  HYDROcodone-acetaminophen (NORCO/VICODIN) 5-325 MG per tablet Take 1-2 tablets by mouth every 6 (six) hours as needed. 06/23/14   Antonietta Breach, PA-C  ibuprofen (ADVIL,MOTRIN) 200 MG tablet Take 800 mg by mouth every 6 (six) hours as needed  for mild pain or moderate pain.    [provider]  insulin NPH-regular Human (NOVOLIN 70/30) (70-30) 100 UNIT/ML injection Inject 25 Units into the skin 2 (two) times daily with a meal. 06/23/14   Antonietta Breach, PA-C    Family History History reviewed. No pertinent family history.  Social History Social History   Tobacco Use  . Smoking status: Current Every Day Smoker    Packs/day: 0.25    Years: 20.00    Pack years: 5.00    Types: Cigarettes  . Smokeless tobacco: Never Used  Substance Use Topics  . Alcohol use: Yes    Alcohol/week: 24.0 oz    Types: 40 Cans of beer per week  . Drug use: Yes    Frequency: 5.0 times per week    Types: Marijuana, Cocaine     Allergies   Shellfish allergy   Review of Systems Review of Systems ROS reviewed and all are negative for acute change except as noted in the HPI.  Physical Exam Updated Vital Signs BP 125/73 (BP Location: Right Arm)   Pulse (!) 124   Temp 99.1 F (37.3 C) (Oral)   Resp 20   Ht 5' 7.5" (1.715 m)   Wt 58.5 kg (129 lb)   SpO2 100%   BMI 19.91 kg/m   Physical Exam  Constitutional: He is oriented to person, place, and time. Vital signs are normal. He appears well-developed and well-nourished.  Active emesis  HENT:  Head: Normocephalic and atraumatic.  Right Ear:  Hearing normal.  Left Ear: Hearing normal.  Eyes: Conjunctivae and EOM are normal. Pupils are equal, round, and reactive to light.  Neck: Normal range of motion. Neck supple.  Cardiovascular: Regular rhythm, normal heart sounds and intact distal pulses. Tachycardia present.  Pulmonary/Chest: Effort normal and breath sounds normal.  Abdominal: Soft. Normal appearance and bowel sounds are normal. There is tenderness in the epigastric area. There is no rigidity, no rebound, no guarding, no CVA tenderness, no tenderness at McBurney's point and negative Murphy's sign.  Abdomen soft  Musculoskeletal: Normal range of motion.  Neurological: He is  alert and oriented to person, place, and time.  Skin: Skin is warm and dry.  Psychiatric: He has a normal mood and affect. His speech is normal and behavior is normal. Thought content normal.  Nursing note and vitals reviewed.    ED Treatments / Results  Labs (all labs ordered are listed, but only abnormal results are displayed) Labs Reviewed  BASIC METABOLIC PANEL - Abnormal; Notable for the following components:      Result Value   Sodium 132 (*)    Potassium 6.1 (*)    Chloride 90 (*)    CO2 9 (*)    Glucose, Bld 748 (*)    BUN 39 (*)    Creatinine, Ser 2.12 (*)    GFR calc non Af Amer 36 (*)    GFR calc Af Amer 42 (*)    Anion gap 33 (*)    All other components within normal limits  CBC - Abnormal; Notable for the following components:   WBC 28.5 (*)    All other components within normal limits  URINALYSIS, ROUTINE W REFLEX MICROSCOPIC - Abnormal; Notable for the following components:   Glucose, UA >=500 (*)    Ketones, ur 80 (*)    Squamous Epithelial / LPF 0-5 (*)    All other components within normal limits  CBG MONITORING, ED - Abnormal; Notable for the following components:   Glucose-Capillary >600 (*)    All other components within normal limits  I-STAT CHEM 8, ED - Abnormal; Notable for the following components:   Sodium 131 (*)    Potassium 6.1 (*)    Chloride 97 (*)    BUN 43 (*)    Creatinine, Ser 1.60 (*)    Glucose, Bld >700 (*)    TCO2 13 (*)    All other components within normal limits  CBG MONITORING, ED - Abnormal; Notable for the following components:   Glucose-Capillary >600 (*)    All other components within normal limits  BLOOD GAS, VENOUS    EKG  EKG Interpretation None       Radiology No results found.  Procedures Procedures (including critical care time) CRITICAL CARE Performed by: Ozella Rocks   Total critical care time: 40 minutes  Critical care time was exclusive of separately billable procedures and treating other  patients.  Critical care was necessary to treat or prevent imminent or life-threatening deterioration.  Critical care was time spent personally by me on the following activities: development of treatment plan with patient and/or surrogate as well as nursing, discussions with consultants, evaluation of patient's response to treatment, examination of patient, obtaining history from patient or surrogate, ordering and performing treatments and interventions, ordering and review of laboratory studies, ordering and review of radiographic studies, pulse oximetry and re-evaluation of patient's condition.   Medications Ordered in ED Medications  insulin regular (NOVOLIN R,HUMULIN R) 100 Units in sodium chloride  0.9 % 100 mL (1 Units/mL) infusion (5.4 Units/hr Intravenous New Bag/Given 03/13/17 0117)  dextrose 5 %-0.45 % sodium chloride infusion (not administered)  sodium chloride 0.9 % bolus 2,000 mL (2,000 mLs Intravenous New Bag/Given 03/13/17 0043)  ondansetron (ZOFRAN) injection 4 mg (4 mg Intravenous Given 03/13/17 0044)  morphine 4 MG/ML injection 4 mg (4 mg Intravenous Given 03/13/17 0044)     Initial Impression / Assessment and Plan / ED Course  I have reviewed the triage vital signs and the nursing notes.  Pertinent labs & imaging results that were available during my care of the patient were reviewed by me and considered in my medical decision making (see chart for details).  Final Clinical Impressions(s) / ED Diagnoses  {I have reviewed and evaluated the relevant laboratory values.  {I have interpreted the relevant EKG. {I have reviewed the relevant previous healthcare records.  {I obtained HPI from historian.   ED Course:  Assessment: Pt is a 41 y.o. male with a hx of DM Type 1, presents to the Emergency Department today due to N/V. Noes hx same with DKA. States that he was recently changed from Lantus and Novolog to Humolog x several weeks ago. Notes non compliance with medication with  lack of glucose checks. States he took Humalog if he "felt" like his blood sugar was high. No chest pressure with emesis. No SOB. No fevers. Notes epigastric abdominal pain. Rates pain 6/10. Cramping. No headache. No neck stiffness. Unsure of blodo glucose. On exam, pt in NAD. Nontoxic/nonseptic appearing. VSS. Afebrile. Lungs CTA. Heart RRR. Abdomen mild epigastric tenderness. POC Glucose >600. CBC 28. Potassium 6.1. Gap 33. Creatinine 2.12 with evidence of AKI. UA with ketones present. Given NS bolus 2L in ED as well as placed on Insulin drip. Pt stable on reexamination. Plan is to Admit to step down.   Disposition/Plan:  Admit Pt acknowledges and agrees with plan  Supervising Physician Jola Schmidt, MD  Final diagnoses:  Diabetic ketoacidosis without coma associated with type 1 diabetes mellitus ([place])  AKI (acute kidney injury) [facility])    ED Discharge Orders    None       Shary Decamp, PA-C 03/13/17 0151    Jola Schmidt, MD 03/13/17 [phone]

## 2017-03-13 NOTE — ED Notes (Signed)
Called provider to report lactic acid 2.3  Ph blood gases venous 7.1

## 2017-03-13 NOTE — Progress Notes (Addendum)
[facility] PROGRESS NOTE    Progress Note  [name]  RNH:[id number] DOB: 1976/04/03 DOA: 03/12/2017 PCP: Patient, No Pcp Per     Brief Narrative:   [name] is an 41 y.o. male past medical history of diabetes mellitus type 1 noncompliance polysubstance abuse, chronic kidney disease who presents with generalized weakness nausea and vomiting.  He relates he switch to Humalog his uncle had 1 week ago, the day of Thanksgiving he started feeling bad with abdominal pain and several episodes of nausea vomiting and epigastric pain.  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 Continue IV insulin, he relates that he is thirsty and slightly dizzy upon standing. We will give him 2 L bolus of normal saline and continue IV hydration. Continue B-met every 4 CBGs q. hourly on the Glucomander protocol. Lactic acid is likely due to DKA there are no signs of sepsis. Continue Zofran for nausea allow ice chips. A1c is pending. Once gap closed will transition long acting insulin plus  SSI. Will have to overlap IV insulin with long acting insulin for 2 hours.  Acute renal failure superimposed on stage 2 chronic kidney disease (HCC) Baseline creatinine 1.1-1.2, on admission is 2.7, he was started on IV fluids and now is 1.7, will give an additional 2 L of normal saline and recheck a basic metabolic panel in the morning. Stop all NSAIDs.  Leukocytosis: Likely reactive. He has remained afebrile.  Substance abuse ([place]) Counseling, monitor with Ativan protocol UDS is pending.  Alcohol dependence (HCC) Continue Ativan protocol.  No signs of withdrawals.  Abdominal pain, epigastric Likely due to DKA, he relates his pain is better.  Tobacco  abuse Counseling.  Hyperkalemia Likely due to acidosis is improving now, at 4 AM it was 4.2 we will continue to follow.  SIRS (systemic inflammatory response syndrome) (HCC) His criteria for Sirs, but no source of infection is likely due to DKA.  DVT prophylaxis: lovenox Family Communication:none Disposition Plan/Barrier to D/C: keep in SDU Code Status:     Code Status Orders  (From admission, onward)        Start     Ordered   03/13/17 0221  Full code  Continuous     03/13/17 0222    Code Status History    Date Active Date Inactive Code Status Order ID Comments User Context   12/28/2013 00:19 12/30/2013 17:14 Full Code [id number]  Berle Mull, MD ED        IV Access:    Peripheral IV   Procedures and diagnostic studies:   Dg Chest Port 1 View  Result Date: 03/13/2017 CLINICAL DATA:  Sepsis EXAM: PORTABLE CHEST 1 VIEW COMPARISON:  Chest radiograph 12/27/2013 FINDINGS: The heart size and mediastinal contours are within normal limits. Both lungs are clear. The visualized skeletal structures are unremarkable. IMPRESSION: No active disease. Electronically Signed   By: Ulyses Jarred M.D.   On: 03/13/2017 02:36     Medical Consultants:    None.  Anti-Infectives:   None  Subjective:    [name] he relates he feels better this morning, but still thirsty and wants to drink water.  Objective:    Vitals:   03/13/17 0600 03/13/17 0630 03/13/17 0650 03/13/17 0700  BP: 116/65 125/67 125/67 121/65  Pulse: (!) 111 (!)  114 (!) 111 (!) 119  Resp: (!) _0 Temp:      TempSrc:      SpO2: 100% 100% 100% 100%  Weight:      Height:        Intake/Output Summary (Last 24 hours) at 03/13/2017 0800 Last data filed at 03/13/2017 0522 Gross per 24 hour  Intake 3000 ml  Output 900 ml  Net 2100 ml   Filed Weights   03/12/17 2259  Weight: 58.5 kg (129 lb)    Exam: General exam: In no acute distress, dry mucous membranes. Respiratory system: Good  air movement and clear to auscultation. Cardiovascular system: S1 & S2 heard, RRR.  Gastrointestinal system: Abdomen is nondistended, soft and nontender.  Central nervous system: Alert and oriented. No focal neurological deficits. Extremities: No pedal edema. Skin: No rashes, lesions or ulcers Psychiatry: Judgement and insight appear normal. Mood & affect appropriate.    Data Reviewed:    Labs: Basic Metabolic Panel: Recent Labs  Lab 03/12/17 2344 03/13/17 0037 03/13/17 0345  NA 132* 131* 139  K 6.1* 6.1* 4.2  CL 90* 97* 108  CO2 9*  --  11*  GLUCOSE 748* >700* 517*  BUN 39* 43* 34*  CREATININE 2.12* 1.60* 1.72*  CALCIUM 9.4  --  7.2*   GFR Estimated Creatinine Clearance: 45.8 mL/min (A) (by C-G formula based on SCr of 1.72 mg/dL (H)). Liver Function Tests: No results for input(s): AST, ALT, ALKPHOS, BILITOT, PROT, ALBUMIN in the last 168 hours. Recent Labs  Lab 03/13/17 0345  LIPASE 48   No results for input(s): AMMONIA in the last 168 hours. Coagulation profile No results for input(s): INR, PROTIME in the last 168 hours.  CBC: Recent Labs  Lab 03/12/17 2344 03/13/17 0037  WBC 28.5*  --   HGB 14.7 16.7  HCT 43.7 49.0  MCV 95.6  --   PLT 371  --    Cardiac Enzymes: No results for input(s): CKTOTAL, CKMB, CKMBINDEX, TROPONINI in the last 168 hours. BNP (last 3 results) No results for input(s): PROBNP in the last 8760 hours. CBG: Recent Labs  Lab 03/13/17 0342 03/13/17 0451 03/13/17 0550 03/13/17 0656 03/13/17 0755  GLUCAP 528* 408* 352* 275* 260*   D-Dimer: No results for input(s): DDIMER in the last 72 hours. Hgb A1c: Recent Labs    03/13/17 0345  HGBA1C 11.9*   Lipid Profile: No results for input(s): CHOL, HDL, LDLCALC, TRIG, CHOLHDL, LDLDIRECT in the last 72 hours. Thyroid function studies: No results for input(s): TSH, T4TOTAL, T3FREE, THYROIDAB in the last 72 hours.  Invalid input(s): FREET3 Anemia work up: No results for input(s):  VITAMINB12, FOLATE, FERRITIN, TIBC, IRON, RETICCTPCT in the last 72 hours. Sepsis Labs: Recent Labs  Lab 03/12/17 2344 03/13/17 0345 03/13/17 0420  PROCALCITON  --  0.91  --   WBC 28.5*  --   --   LATICACIDVEN  --   --  2.3*   Microbiology Recent Results (from the past 240 hour(s))  Rapid Strep Screen (Not at [facility])     Status: None   Collection Time: 03/13/17  4:30 AM  Result Value Ref Range Status   Streptococcus, Group A Screen (Direct) NEGATIVE NEGATIVE Final    Comment: (NOTE) A Rapid Antigen test may result negative if the antigen level in the sample is below the detection level of this test. The FDA has not cleared this test as a stand-alone test therefore the rapid antigen negative result has reflexed  to a Group A Strep culture.      Medications:   . enoxaparin (LOVENOX) injection  40 mg Subcutaneous Q24H  . folic acid  1 mg Oral Daily  . LORazepam  0-4 mg Intravenous Q6H   Followed by  . [START ON 03/15/2017] LORazepam  0-4 mg Intravenous Q12H  . metoCLOPramide (REGLAN) injection  5 mg Intravenous Q8H  . multivitamin with minerals  1 tablet Oral Daily  . nicotine  21 mg Transdermal Daily  . thiamine  100 mg Oral Daily   Or  . thiamine  100 mg Intravenous Daily   Continuous Infusions: . sodium chloride 125 mL/hr at 03/13/17 0519  . sodium chloride    . dextrose 5 % and 0.45% NaCl Stopped (03/13/17 0249)  . dextrose 5 % and 0.45% NaCl    . famotidine (PEPCID) IV Stopped (03/13/17 0350)  . insulin (NOVOLIN-R) infusion 6.5 Units/hr (03/13/17 0703)  . sodium chloride         LOS: 0 days   Charlynne Cousins  [facility] Pager [phone]  *Please refer to [email or web address], password [email or web address] to get updated schedule on who will round on this patient, as hospitalists switch teams weekly. If 7PM-7AM, please contact night-coverage at [email or web address], password [email or web address] for any overnight needs.  03/13/2017, 8:00 AM

## 2017-03-13 NOTE — ED Notes (Signed)
ED TO INPATIENT HANDOFF REPORT  Name/Age/Gender Catcher Nop 41 y.o. male  Code Status    Code Status Orders  (From admission, onward)        Start     Ordered   03/13/17 0221  Full code  Continuous     03/13/17 0222    Code Status History    Date Active Date Inactive Code Status Order ID Comments User Context   12/28/2013 00:19 12/30/2013 17:14 Full Code [id number]  Berle Mull, MD ED      Home/SNF/Other Home  Chief Complaint Flu Like Symptoms; Emesis  Level of Care/Admitting Diagnosis ED Disposition    ED Disposition Condition Comment   Admit  Hospital Area: [facility] [[id number]]  Level of Care: Med-Surg [16]  Diagnosis: DKA, type 1 ([place]) [[id number]]  Admitting Physician: Charlynne Cousins [[id number]]  Attending Physician: Charlynne Cousins [[id number]]  Estimated length of stay: past midnight tomorrow  Certification:: I certify this patient will need inpatient services for at least 2 midnights  PT Class (Do Not Modify): Inpatient [101]  PT Acc Code (Do Not Modify): Private [1]       Medical History Past Medical History:  Diagnosis Date  . CKD (chronic kidney disease), stage II   . Diabetes mellitus without complication ([place])    type 1  . Polysubstance abuse (H[facility])     Allergies Allergies  Allergen Reactions  . Shellfish Allergy Anaphylaxis    IV Location/Drains/Wounds Patient Lines/Drains/Airways Status   Active Line/Drains/Airways    Name:   Placement date:   Placement time:   Site:   Days:   Peripheral IV 03/13/17 Right Hand   03/13/17    0027    Hand   less than 1   Peripheral IV 03/13/17 Left Hand   03/13/17    0121    Hand   less than 1          Labs/Imaging Results for orders placed or performed during the hospital encounter of 03/12/17 (from the past 48 hour(s))  CBG monitoring, ED     Status: Abnormal   Collection Time: 03/12/17 11:28 PM  Result Value Ref Range   Glucose-Capillary >600 (HH) 65 - 99 mg/dL  Urinalysis,  Routine w reflex microscopic     Status: Abnormal   Collection Time: 03/12/17 11:29 PM  Result Value Ref Range   Color, Urine YELLOW YELLOW   APPearance CLEAR CLEAR   Specific Gravity, Urine 1.018 1.005 - 1.030   pH 5.0 5.0 - 8.0   Glucose, UA >=500 (A) NEGATIVE mg/dL   Hgb urine dipstick NEGATIVE NEGATIVE   Bilirubin Urine NEGATIVE NEGATIVE   Ketones, ur 80 (A) NEGATIVE mg/dL   Protein, ur NEGATIVE NEGATIVE mg/dL   Nitrite NEGATIVE NEGATIVE   Leukocytes, UA NEGATIVE NEGATIVE   RBC / HPF NONE SEEN 0 - 5 RBC/hpf   WBC, UA NONE SEEN 0 - 5 WBC/hpf   Bacteria, UA NONE SEEN NONE SEEN   Squamous Epithelial / LPF 0-5 (A) NONE SEEN   Mucus PRESENT   Basic metabolic panel     Status: Abnormal   Collection Time: 03/12/17 11:44 PM  Result Value Ref Range   Sodium 132 (L) 135 - 145 mmol/L   Potassium 6.1 (H) 3.5 - 5.1 mmol/L   Chloride 90 (L) 101 - 111 mmol/L   CO2 9 (L) 22 - 32 mmol/L   Glucose, Bld 748 (HH) 65 - 99 mg/dL    Comment: CRITICAL RESULT CALLED TO,  READ BACK BY AND VERIFIED WITH: L COLES RN [id number] 03/13/17 A NAVARRO    BUN 39 (H) 6 - 20 mg/dL   Creatinine, Ser 2.12 (H) 0.61 - 1.24 mg/dL   Calcium 9.4 8.9 - 10.3 mg/dL   GFR calc non Af Amer 36 (L) >60 mL/min   GFR calc Af Amer 42 (L) >60 mL/min    Comment: (NOTE) The eGFR has been calculated using the CKD EPI equation. This calculation has not been validated in all clinical situations. eGFR's persistently <60 mL/min signify possible Chronic Kidney Disease.    Anion gap 33 (H) 5 - 15  CBC     Status: Abnormal   Collection Time: 03/12/17 11:44 PM  Result Value Ref Range   WBC 28.5 (H) 4.0 - 10.5 K/uL   RBC 4.57 4.22 - 5.81 MIL/uL   Hemoglobin 14.7 13.0 - 17.0 g/dL   HCT 43.7 39.0 - 52.0 %   MCV 95.6 78.0 - 100.0 fL   MCH 32.2 26.0 - 34.0 pg   MCHC 33.6 30.0 - 36.0 g/dL   RDW 13.6 11.5 - 15.5 %   Platelets 371 150 - 400 K/uL  I-Stat Chem 8, ED     Status: Abnormal   Collection Time: 03/13/17 12:37 AM  Result Value  Ref Range   Sodium 131 (L) 135 - 145 mmol/L   Potassium 6.1 (H) 3.5 - 5.1 mmol/L   Chloride 97 (L) 101 - 111 mmol/L   BUN 43 (H) 6 - 20 mg/dL   Creatinine, Ser 1.60 (H) 0.61 - 1.24 mg/dL   Glucose, Bld >700 (HH) 65 - 99 mg/dL   Calcium, Ion 1.15 1.15 - 1.40 mmol/L   TCO2 13 (L) 22 - 32 mmol/L   Hemoglobin 16.7 13.0 - 17.0 g/dL   HCT 49.0 39.0 - 52.0 %   Comment NOTIFIED PHYSICIAN   CBG monitoring, ED     Status: Abnormal   Collection Time: 03/13/17  1:11 AM  Result Value Ref Range   Glucose-Capillary >600 (HH) 65 - 99 mg/dL   Comment 1 Notify RN    Comment 2 Document in Chart   CBG monitoring, ED     Status: Abnormal   Collection Time: 03/13/17  2:20 AM  Result Value Ref Range   Glucose-Capillary 575 (HH) 65 - 99 mg/dL   Comment 1 Notify RN    Comment 2 Document in Chart   CBG monitoring, ED     Status: Abnormal   Collection Time: 03/13/17  3:42 AM  Result Value Ref Range   Glucose-Capillary 528 (HH) 65 - 99 mg/dL  Lipase, blood     Status: None   Collection Time: 03/13/17  3:45 AM  Result Value Ref Range   Lipase 48 11 - 51 U/L  Procalcitonin     Status: None   Collection Time: 03/13/17  3:45 AM  Result Value Ref Range   Procalcitonin 0.91 ng/mL    Comment:        Interpretation: PCT > 0.5 ng/mL and <= 2 ng/mL: Systemic infection (sepsis) is possible, but other conditions are known to elevate PCT as well. (NOTE)         ICU PCT Algorithm               Non ICU PCT Algorithm    ----------------------------     ------------------------------         PCT < 0.25 ng/mL  PCT < 0.1 ng/mL     Stopping of antibiotics            Stopping of antibiotics       strongly encouraged.               strongly encouraged.    ----------------------------     ------------------------------       PCT level decrease by               PCT < 0.25 ng/mL       >= 80% from peak PCT       OR PCT 0.25 - 0.5 ng/mL          Stopping of antibiotics                                              encouraged.     Stopping of antibiotics           encouraged.    ----------------------------     ------------------------------       PCT level decrease by              PCT >= 0.25 ng/mL       < 80% from peak PCT        AND PCT >= 0.5 ng/mL             Continuing antibiotics                                              encouraged.       Continuing antibiotics            encouraged.    ----------------------------     ------------------------------     PCT level increase compared          PCT > 0.5 ng/mL         with peak PCT AND          PCT >= 0.5 ng/mL             Escalation of antibiotics                                          strongly encouraged.      Escalation of antibiotics        strongly encouraged.   Hemoglobin A1c     Status: Abnormal   Collection Time: 03/13/17  3:45 AM  Result Value Ref Range   Hgb A1c MFr Bld 11.9 (H) 4.8 - 5.6 %    Comment: (NOTE) Pre diabetes:          5.7%-6.4% Diabetes:              >6.4% Glycemic control for   <7.0% adults with diabetes    Mean Plasma Glucose 294.83 mg/dL    Comment: Performed at [facility] [address] [place], [place] [address]  Basic metabolic panel     Status: Abnormal   Collection Time: 03/13/17  3:45 AM  Result Value Ref Range   Sodium 139 135 - 145 mmol/L    Comment: DELTA CHECK NOTED REPEATED TO VERIFY    Potassium  4.2 3.5 - 5.1 mmol/L    Comment: DELTA CHECK NOTED REPEATED TO VERIFY    Chloride 108 101 - 111 mmol/L   CO2 11 (L) 22 - 32 mmol/L   Glucose, Bld 517 (HH) 65 - 99 mg/dL    Comment: CRITICAL RESULT CALLED TO, READ BACK BY AND VERIFIED WITHLoma Sender RN [id number] 03/13/17 A NAVARRO    BUN 34 (H) 6 - 20 mg/dL   Creatinine, Ser 1.72 (H) 0.61 - 1.24 mg/dL   Calcium 7.2 (L) 8.9 - 10.3 mg/dL    Comment: DELTA CHECK NOTED REPEATED TO VERIFY    GFR calc non Af Amer 47 (L) >60 mL/min   GFR calc Af Amer 54 (L) >60 mL/min    Comment: (NOTE) The eGFR has been calculated using the CKD  EPI equation. This calculation has not been validated in all clinical situations. eGFR's persistently <60 mL/min signify possible Chronic Kidney Disease.    Anion gap 20 (H) 5 - 15  Creatinine, urine, random     Status: None   Collection Time: 03/13/17  3:52 AM  Result Value Ref Range   Creatinine, Urine 40.11 mg/dL    Comment: Performed at [facility] [address] [place], [place] [address]  Sodium, urine, random     Status: None   Collection Time: 03/13/17  3:52 AM  Result Value Ref Range   Sodium, Ur 44 mmol/L    Comment: Performed at [facility] [address], [place], [place] [address]  Rapid urine drug screen (hospital performed)     Status: Abnormal   Collection Time: 03/13/17  3:52 AM  Result Value Ref Range   Opiates POSITIVE (A) NONE DETECTED   Cocaine POSITIVE (A) NONE DETECTED   Benzodiazepines NONE DETECTED NONE DETECTED   Amphetamines NONE DETECTED NONE DETECTED   Tetrahydrocannabinol POSITIVE (A) NONE DETECTED   Barbiturates NONE DETECTED NONE DETECTED    Comment:        DRUG SCREEN FOR MEDICAL PURPOSES ONLY.  IF CONFIRMATION IS NEEDED FOR ANY PURPOSE, NOTIFY LAB WITHIN 5 DAYS.        LOWEST DETECTABLE LIMITS FOR URINE DRUG SCREEN Drug Class       Cutoff (ng/mL) Amphetamine      1000 Barbiturate      200 Benzodiazepine   595 Tricyclics       638 Opiates          300 Cocaine          300 THC              50   Lactic acid, plasma     Status: Abnormal   Collection Time: 03/13/17  4:20 AM  Result Value Ref Range   Lactic Acid, Venous 2.3 (HH) 0.5 - 1.9 mmol/L    Comment: CRITICAL RESULT CALLED TO, READ BACK BY AND VERIFIED WITH: Loma Sender RN [phone] 03/13/17 A NAVARRO   Blood gas, venous     Status: Abnormal   Collection Time: 03/13/17  4:30 AM  Result Value Ref Range   pH, Ven 7.190 (LL) 7.250 - 7.430    Comment: CRITICAL RESULT CALLED TO, READ BACK BY AND VERIFIED WITH: Hulan Amato, RN AT [phone] 03/13/2017 BY PATRICK SWEENEY  RRT,RCP    pCO2, Ven 38.0 (L) 44.0 - 60.0 mmHg   pO2, Ven 33.8 32.0 - 45.0 mmHg   Bicarbonate 14.0 (L) 20.0 - 28.0 mmol/L   Acid-base deficit 13.5 (H) 0.0 - 2.0 mmol/L  O2 Saturation 54.0 %   Patient temperature 98.6   Influenza panel by PCR (type A & B)     Status: None   Collection Time: 03/13/17  4:30 AM  Result Value Ref Range   Influenza A By PCR NEGATIVE NEGATIVE   Influenza B By PCR NEGATIVE NEGATIVE    Comment: (NOTE) The Xpert Xpress Flu assay is intended as an aid in the diagnosis of  influenza and should not be used as a sole basis for treatment.  This  assay is FDA approved for nasopharyngeal swab specimens only. Nasal  washings and aspirates are unacceptable for Xpert Xpress Flu testing.   Rapid Strep Screen (Not at [facility])     Status: None   Collection Time: 03/13/17  4:30 AM  Result Value Ref Range   Streptococcus, Group A Screen (Direct) NEGATIVE NEGATIVE    Comment: (NOTE) A Rapid Antigen test may result negative if the antigen level in the sample is below the detection level of this test. The FDA has not cleared this test as a stand-alone test therefore the rapid antigen negative result has reflexed to a Group A Strep culture.   CBG monitoring, ED     Status: Abnormal   Collection Time: 03/13/17  4:51 AM  Result Value Ref Range   Glucose-Capillary 408 (H) 65 - 99 mg/dL   Comment 1 Notify RN    Comment 2 Document in Chart   CBG monitoring, ED     Status: Abnormal   Collection Time: 03/13/17  5:50 AM  Result Value Ref Range   Glucose-Capillary 352 (H) 65 - 99 mg/dL   Comment 1 Notify RN    Comment 2 Document in Chart   CBG monitoring, ED     Status: Abnormal   Collection Time: 03/13/17  6:56 AM  Result Value Ref Range   Glucose-Capillary 275 (H) 65 - 99 mg/dL  CBG monitoring, ED     Status: Abnormal   Collection Time: 03/13/17  7:55 AM  Result Value Ref Range   Glucose-Capillary 260 (H) 65 - 99 mg/dL  Basic metabolic panel     Status: Abnormal    Collection Time: 03/13/17  8:17 AM  Result Value Ref Range   Sodium 145 135 - 145 mmol/L   Potassium 3.5 3.5 - 5.1 mmol/L   Chloride 118 (H) 101 - 111 mmol/L   CO2 19 (L) 22 - 32 mmol/L   Glucose, Bld 245 (H) 65 - 99 mg/dL   BUN 25 (H) 6 - 20 mg/dL   Creatinine, Ser 1.15 0.61 - 1.24 mg/dL   Calcium 7.1 (L) 8.9 - 10.3 mg/dL   GFR calc non Af Amer >60 >60 mL/min   GFR calc Af Amer >60 >60 mL/min    Comment: (NOTE) The eGFR has been calculated using the CKD EPI equation. This calculation has not been validated in all clinical situations. eGFR's persistently <60 mL/min signify possible Chronic Kidney Disease.    Anion gap 8 5 - 15  CBG monitoring, ED     Status: Abnormal   Collection Time: 03/13/17  9:03 AM  Result Value Ref Range   Glucose-Capillary 187 (H) 65 - 99 mg/dL  CBG monitoring, ED     Status: Abnormal   Collection Time: 03/13/17 10:09 AM  Result Value Ref Range   Glucose-Capillary 141 (H) 65 - 99 mg/dL  Basic metabolic panel     Status: Abnormal   Collection Time: 03/13/17 10:30 AM  Result Value Ref Range  Sodium 143 135 - 145 mmol/L   Potassium 3.7 3.5 - 5.1 mmol/L   Chloride 117 (H) 101 - 111 mmol/L   CO2 21 (L) 22 - 32 mmol/L   Glucose, Bld 146 (H) 65 - 99 mg/dL   BUN 24 (H) 6 - 20 mg/dL   Creatinine, Ser 1.13 0.61 - 1.24 mg/dL   Calcium 7.6 (L) 8.9 - 10.3 mg/dL   GFR calc non Af Amer >60 >60 mL/min   GFR calc Af Amer >60 >60 mL/min    Comment: (NOTE) The eGFR has been calculated using the CKD EPI equation. This calculation has not been validated in all clinical situations. eGFR's persistently <60 mL/min signify possible Chronic Kidney Disease.    Anion gap 5 5 - 15  CBG monitoring, ED     Status: Abnormal   Collection Time: 03/13/17 11:22 AM  Result Value Ref Range   Glucose-Capillary 116 (H) 65 - 99 mg/dL  CBG monitoring, ED     Status: Abnormal   Collection Time: 03/13/17 12:29 PM  Result Value Ref Range   Glucose-Capillary 108 (H) 65 - 99 mg/dL   CBG monitoring, ED     Status: Abnormal   Collection Time: 03/13/17  1:37 PM  Result Value Ref Range   Glucose-Capillary 111 (H) 65 - 99 mg/dL   Dg Chest Port 1 View  Result Date: 03/13/2017 CLINICAL DATA:  Sepsis EXAM: PORTABLE CHEST 1 VIEW COMPARISON:  Chest radiograph 12/27/2013 FINDINGS: The heart size and mediastinal contours are within normal limits. Both lungs are clear. The visualized skeletal structures are unremarkable. IMPRESSION: No active disease. Electronically Signed   By: Ulyses Jarred M.D.   On: 03/13/2017 02:36    Pending Labs Unresulted Labs (From admission, onward)   Start     Ordered   03/14/17 0500  CBC  Tomorrow morning,   STAT     03/13/17 1212   03/13/17 0430  Culture, group A strep  Once,   STAT     03/13/17 0430   03/13/17 0221  HIV antibody (Routine Testing)  Once,   R     03/13/17 0222   03/13/17 2355  Basic metabolic panel  STAT Now then every 4 hours ,   STAT     03/13/17 0222   03/13/17 0219  Culture, blood (x 2)  BLOOD CULTURE X 2,   STAT    Comments:  INITIATE ANTIBIOTICS WITHIN 1 HOUR AFTER BLOOD CULTURES DRAWN.  If unable to obtain blood cultures, call MD immediately regarding antibiotic instructions.    03/13/17 0219      Vitals/Pain Today's Vitals   03/13/17 1130 03/13/17 1200 03/13/17 1230 03/13/17 1330  BP: 125/65 109/68 107/62 124/76  Pulse: 99 98 (!) 105 99  Resp: 16 16 15 15   Temp:      TempSrc:      SpO2: 100% 100% 99% 100%  Weight:      Height:      PainSc:        Isolation Precautions No active isolations  Medications Medications  insulin regular (NOVOLIN R,HUMULIN R) 100 Units in sodium chloride 0.9 % 100 mL (1 Units/mL) infusion (1 Units/hr Intravenous Rate/Dose Change 03/13/17 1340)  ondansetron (ZOFRAN) injection 4 mg (not administered)  famotidine (PEPCID) IVPB 20 mg premix (0 mg Intravenous Stopped 03/13/17 1128)  nicotine (NICODERM CQ - dosed in mg/24 hours) patch 21 mg (not administered)  metoCLOPramide  (REGLAN) injection 5 mg (not administered)  dextrose 5 %-0.45 % sodium chloride  infusion ( Intravenous Not Given 03/13/17 0230)  enoxaparin (LOVENOX) injection 40 mg (not administered)  menthol-cetylpyridinium (CEPACOL) lozenge 3 mg (3 mg Oral Given 03/13/17 0615)  LORazepam (ATIVAN) tablet 1 mg (not administered)    Or  LORazepam (ATIVAN) injection 1 mg (not administered)  thiamine (VITAMIN B-1) tablet 100 mg (not administered)    Or  thiamine (B-1) injection 100 mg (not administered)  folic acid (FOLVITE) tablet 1 mg (not administered)  multivitamin with minerals tablet 1 tablet (not administered)  LORazepam (ATIVAN) injection 0-4 mg (0 mg Intravenous Hold 03/13/17 0527)    Followed by  LORazepam (ATIVAN) injection 0-4 mg (not administered)  Influenza vac split quadrivalent PF (FLUARIX) injection 0.5 mL (not administered)  insulin glargine (LANTUS) injection 10 Units (10 Units Subcutaneous Given 03/13/17 1255)  insulin aspart (novoLOG) injection 0-15 Units (not administered)  insulin aspart (novoLOG) injection 0-5 Units (not administered)  insulin aspart (novoLOG) injection 3 Units (not administered)  sodium chloride 0.9 % bolus 2,000 mL (0 mLs Intravenous Stopped 03/13/17 0218)  ondansetron (ZOFRAN) injection 4 mg (4 mg Intravenous Given 03/13/17 0044)  morphine 4 MG/ML injection 4 mg (4 mg Intravenous Given 03/13/17 0044)  sodium chloride 0.9 % bolus 1,000 mL (0 mLs Intravenous Stopped 03/13/17 0350)  sodium chloride 0.9 % bolus 2,000 mL (0 mLs Intravenous Stopped 03/13/17 1002)    Mobility walks

## 2017-03-13 NOTE — ED Notes (Signed)
Pt verbalizes he is awake and alert enough to take cepacol

## 2017-03-13 NOTE — Telephone Encounter (Signed)
Message received from Eldridge Abrahams, RN CM informing this CM that the patient has been instructed to contact [facility] to schedule a hospital follow up appointment and to call [facility] to schedule a financial counseling appt. This information, including the information about [facility], was placed on his AVS.

## 2017-03-14 DIAGNOSIS — N179 Acute kidney failure, unspecified: Secondary | ICD-10-CM

## 2017-03-14 DIAGNOSIS — E1010 Type 1 diabetes mellitus with ketoacidosis without coma: Principal | ICD-10-CM

## 2017-03-14 DIAGNOSIS — N182 Chronic kidney disease, stage 2 (mild): Secondary | ICD-10-CM

## 2017-03-14 LAB — CBC
HCT: 33.1 % — ABNORMAL LOW (ref 39.0–52.0)
Hemoglobin: 11.3 g/dL — ABNORMAL LOW (ref 13.0–17.0)
MCH: 31 pg (ref 26.0–34.0)
MCHC: 34.1 g/dL (ref 30.0–36.0)
MCV: 90.9 fL (ref 78.0–100.0)
PLATELETS: 227 10*3/uL (ref 150–400)
RBC: 3.64 MIL/uL — AB (ref 4.22–5.81)
RDW: 13.6 % (ref 11.5–15.5)
WBC: 13.7 10*3/uL — AB (ref 4.0–10.5)

## 2017-03-14 LAB — GLUCOSE, CAPILLARY
GLUCOSE-CAPILLARY: 197 mg/dL — AB (ref 65–99)
GLUCOSE-CAPILLARY: 171 mg/dL — AB (ref 65–99)

## 2017-03-14 MED ORDER — ACETAMINOPHEN 325 MG PO TABS
650.0000 mg | ORAL_TABLET | Freq: Four times a day (QID) | ORAL | Status: DC | PRN
  Administered 2017-03-14: 650 mg via ORAL
  Filled 2017-03-14: qty 2

## 2017-03-14 MED ORDER — TRAMADOL HCL 50 MG PO TABS
50.0000 mg | ORAL_TABLET | Freq: Four times a day (QID) | ORAL | Status: AC | PRN
  Administered 2017-03-14 (×2): 50 mg via ORAL
  Filled 2017-03-14: qty 1

## 2017-03-14 MED ORDER — INSULIN ASPART 100 UNIT/ML ~~LOC~~ SOLN: 5.0000 [IU] | Freq: Three times a day (TID) | SUBCUTANEOUS | 0 refills | Status: DC

## 2017-03-14 MED ORDER — TRAMADOL HCL 50 MG PO TABS
ORAL_TABLET | ORAL | Status: AC
  Filled 2017-03-14: qty 1

## 2017-03-14 MED ORDER — BLOOD GLUCOSE MONITOR KIT: PACK | 0 refills | Status: DC

## 2017-03-14 MED ORDER — MENTHOL 3 MG MT LOZG: 1.0000 | LOZENGE | OROMUCOSAL | 0 refills | Status: DC | PRN

## 2017-03-14 MED ORDER — FAMOTIDINE 20 MG PO TABS: 20.0000 mg | ORAL_TABLET | Freq: Two times a day (BID) | ORAL | Status: DC

## 2017-03-14 MED ORDER — ONDANSETRON HCL 4 MG PO TABS: 4.0000 mg | ORAL_TABLET | Freq: Three times a day (TID) | ORAL | 0 refills | Status: DC | PRN

## 2017-03-14 MED ORDER — "INSULIN SYRINGE 30G X 1/2"" 0.5 ML MISC": 0 refills | Status: DC

## 2017-03-14 MED ORDER — INSULIN GLARGINE 100 UNIT/ML ~~LOC~~ SOLN: 25.0000 [IU] | Freq: Every day | SUBCUTANEOUS | 1 refills | Status: DC

## 2017-03-14 MED FILL — TRUEplus LANCETS 28G MISC: 25 days supply | Qty: 100 | Fill #0

## 2017-03-14 MED FILL — !LANTUS 100 UNITS/ML VIAL: 100 | 28 days supply | Qty: 10 | Fill #0

## 2017-03-14 MED FILL — !TRUE METRIX BLOOD GLUCOSE: 30 days supply | Qty: 1 | Fill #0

## 2017-03-14 MED FILL — TRUEPLUS SYR 0.5ML 30GX5/16: 30G X 5/16" | 30 days supply | Qty: 100 | Fill #0

## 2017-03-14 MED FILL — TRUE METRIX GLUCOSE TEST ST: 12 days supply | Qty: 50 | Fill #0

## 2017-03-14 MED FILL — ONDANSETRON HCL 4 MG TABLET: 4 | 7 days supply | Qty: 20 | Fill #0

## 2017-03-14 MED FILL — !NOVOLOG 100UNITS/ML VIAL: 100/ML | 28 days supply | Qty: 10 | Fill #0

## 2017-03-14 NOTE — Discharge Summary (Addendum)
Physician Discharge Summary  [name] JEH:[id number] DOB: May 30, 1975 DOA: 03/12/2017  PCP: Patient, No Pcp Per  Admit date: 03/12/2017 Discharge date: 03/14/2017  Admitted From:home Disposition:home  Recommendations for Outpatient Follow-up:  1. Follow up with PCP in 1-2 weeks 2. Please obtain BMP/CBC in one week  Home Health:no Equipment/Devices:none Discharge Condition:stable CODE STATUS:full code Diet recommendation:carb modified diet  Brief/Interim Summary: 41 year old male with history of type 1 diabetes noncompliance with the insulin, polysubstance abuse, urine toxicology positive for opiates, cocaine, cannabinoid presented with generalized weakness nausea, vomiting in the setting of diabetic ketoacidosis.  Patient was a started on IV fluids, insulin drip with improvement in DKA.  Anion gap closed.  Patient is clinically improved.  He is complaining of some sore throat likely due to vomiting.  Able to tolerate diet well.  Prescription for insulin and diabetic supplies provided to the patient and recommended to follow-up with wellness clinic.  Case manager and diabetic educator nurse evaluation for diabetic supplies and education.  Patient is clinically stable and I recommend to follow-up outpatient. Education provided to quit substance abuse. Acute kidney injury improved. 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 ([place])   DKA, type 1 ([place])    Discharge Instructions  Discharge Instructions    Call MD for:  difficulty breathing, headache or visual disturbances   Complete by:  As directed    Call MD for:  extreme fatigue    Complete by:  As directed    Call MD for:  hives   Complete by:  As directed    Call MD for:  persistant dizziness or light-headedness   Complete by:  As directed    Call MD for:  persistant nausea and vomiting   Complete by:  As directed    Call MD for:  severe uncontrolled pain   Complete by:  As directed    Call MD for:  temperature >100.4   Complete by:  As directed    Diet Carb Modified   Complete by:  As directed    Increase activity slowly   Complete by:  As directed      Allergies as of 03/14/2017      Reactions   Shellfish Allergy Anaphylaxis      Medication List    STOP taking these medications   glucose blood test strip   insulin lispro protamine-lispro (75-25) 100 UNIT/ML Susp injection Commonly known as:  HUMALOG 75/25 MIX     TAKE these medications   blood glucose meter kit and supplies Kit Dispense based on patient and insurance preference. Use up to four times daily as directed. (FOR ICD-9 250.00, 250.01).   ibuprofen 200 MG tablet Commonly known as:  ADVIL,MOTRIN Take 800 mg by mouth every 6 (six) hours as needed for mild pain or moderate pain.   insulin aspart 100 UNIT/ML injection Commonly known as:  NOVOLOG Inject 5 Units into the skin 3 (three) times daily with meals.   insulin glargine 100 UNIT/ML injection Commonly known as:  LANTUS Inject 0.25 mLs (25 Units total) into the skin at bedtime. What changed:  how much to take   INSULIN SYRINGE .5CC/30GX1/2" 30G X 1/2"  0.5 ML Misc Use 1-3 times with insulin   menthol-cetylpyridinium 3 MG lozenge Commonly known as:  CEPACOL Take 1 lozenge (3 mg total) by mouth as needed for sore throat.   ondansetron 4 MG tablet Commonly known as:  ZOFRAN Take 1 tablet (4 mg total) by mouth every 8 (eight) hours as needed for nausea or vomiting.      Follow-up Information    [facility] Follow up.   Why:  Can become your primary care doctor even without  insurance. Contact information: [place] [address] [place] Follow up.   Why:  Once you make an appointment at the [facility], you can use this location for your PHARMACY needs and for FINANCIAL COUNSELING. Contact information: [place] [address] [phone]         Allergies  Allergen Reactions  . Shellfish Allergy Anaphylaxis    Consultations: None  Procedures/Studies: None  Subjective: Seen and examined at bedside.  Denies headache, dizziness, nausea vomiting chest pain shortness of breath.  No abdominal pain.  Discharge Exam: Vitals:   03/14/17 0751 03/14/17 1200  BP: 130/77 132/87  Pulse: 93 99  Resp: 16 16  Temp: 98.5 F (36.9 C) 98.4 F (36.9 C)  SpO2: 99% 100%   Vitals:   03/14/17 0100 03/14/17 0553 03/14/17 0751 03/14/17 1200  BP: 123/75 135/84 130/77 132/87  Pulse: 99 92 93 99  Resp: _0 Temp: 98.4 F (36.9 C) 98.4 F (36.9 C) 98.5 F (36.9 C) 98.4 F (36.9 C)  TempSrc: Oral Oral Oral Oral  SpO2: 100% 100% 99% 100%  Weight:      Height:        General: Pt is alert, awake, not in acute distress Cardiovascular: RRR, S1/S2 +, no rubs, no gallops Respiratory: CTA bilaterally, no wheezing, no rhonchi Abdominal: Soft, NT, ND, bowel sounds + Extremities: no edema, no cyanosis    The results of significant diagnostics from this hospitalization (including imaging, microbiology, ancillary and laboratory) are listed below for reference.     Microbiology: Recent Results (from the past 240 hour(s))  Rapid Strep Screen (Not at [facility])     Status: None   Collection Time: 03/13/17  4:30 AM  Result Value Ref Range Status   Streptococcus, Group A Screen (Direct) NEGATIVE NEGATIVE Final    Comment: (NOTE) A Rapid Antigen test may result negative if the antigen level in the sample is below  the detection level of this test. The FDA has not cleared this test as a stand-alone test therefore the rapid antigen negative result has reflexed to a Group A Strep culture.      Labs: BNP (last 3 results) No results for input(s): BNP in the last 8760 hours. Basic Metabolic Panel: Recent Labs  Lab 03/12/17 2344 03/13/17 0037 03/13/17 0345 03/13/17 0817 03/13/17 1030 03/13/17 1421  NA 132* 131* 139 145 143 143  K 6.1* 6.1* 4.2 3.5 3.7 3.6  CL 90* 97* 108 118* 117* 114*  CO2 9*  --  11* 19* 21* 22  GLUCOSE 748* >700* 517* 245* 146* 115*  BUN 39* 43* 34* 25* 24* 19  CREATININE 2.12* 1.60* 1.72* 1.15 1.13 1.04  CALCIUM 9.4  --  7.2* 7.1* 7.6* 7.7*   Liver Function Tests: No results for input(s): AST, ALT, ALKPHOS, BILITOT, PROT, ALBUMIN in the  last 168 hours. Recent Labs  Lab 03/13/17 0345  LIPASE 48   No results for input(s): AMMONIA in the last 168 hours. CBC: Recent Labs  Lab 03/12/17 2344 03/13/17 0037 03/14/17 0648  WBC 28.5*  --  13.7*  HGB 14.7 16.7 11.3*  HCT 43.7 49.0 33.1*  MCV 95.6  --  90.9  PLT 371  --  227   Cardiac Enzymes: No results for input(s): CKTOTAL, CKMB, CKMBINDEX, TROPONINI in the last 168 hours. BNP: Invalid input(s): POCBNP CBG: Recent Labs  Lab 03/13/17 1337 03/13/17 1631 03/13/17 2109 03/14/17 0834 03/14/17 1127  GLUCAP 111* 109* 181* 197* 171*   D-Dimer No results for input(s): DDIMER in the last 72 hours. Hgb A1c Recent Labs    03/13/17 0345  HGBA1C 11.9*   Lipid Profile No results for input(s): CHOL, HDL, LDLCALC, TRIG, CHOLHDL, LDLDIRECT in the last 72 hours. Thyroid function studies No results for input(s): TSH, T4TOTAL, T3FREE, THYROIDAB in the last 72 hours.  Invalid input(s): FREET3 Anemia work up No results for input(s): VITAMINB12, FOLATE, FERRITIN, TIBC, IRON, RETICCTPCT in the last 72 hours. Urinalysis    Component Value Date/Time   COLORURINE YELLOW 03/12/2017 2329   APPEARANCEUR CLEAR 03/12/2017  2329   LABSPEC 1.018 03/12/2017 2329   PHURINE 5.0 03/12/2017 2329   GLUCOSEU >=500 (A) 03/12/2017 2329   HGBUR NEGATIVE 03/12/2017 2329   BILIRUBINUR NEGATIVE 03/12/2017 2329   KETONESUR 80 (A) 03/12/2017 2329   PROTEINUR NEGATIVE 03/12/2017 2329   UROBILINOGEN 0.2 12/27/2013 2325   NITRITE NEGATIVE 03/12/2017 2329   LEUKOCYTESUR NEGATIVE 03/12/2017 2329   Sepsis Labs Invalid input(s): PROCALCITONIN,  WBC,  LACTICIDVEN Microbiology Recent Results (from the past 240 hour(s))  Rapid Strep Screen (Not at [facility])     Status: None   Collection Time: 03/13/17  4:30 AM  Result Value Ref Range Status   Streptococcus, Group A Screen (Direct) NEGATIVE NEGATIVE Final    Comment: (NOTE) A Rapid Antigen test may result negative if the antigen level in the sample is below the detection level of this test. The FDA has not cleared this test as a stand-alone test therefore the rapid antigen negative result has reflexed to a Group A Strep culture.      Time coordinating discharge: 32 minutes  SIGNED:   Rosita Fire, MD  [facility] 03/14/2017, 2:09 PM  If 7PM-7AM, please contact night-coverage [email or web address] Password [email or web address]

## 2017-03-14 NOTE — Progress Notes (Signed)
Went over patient's d/c instructions with patient.  He verbalized understanding.  Verbalized follow up appointments and insulin administration.  Left hospital with all hard scripts and patient belongings via personal vehicle. Levora Angel, RN

## 2017-03-14 NOTE — Progress Notes (Addendum)
Inpatient Diabetes Program Recommendations  AACE/ADA: New Consensus Statement on Inpatient Glycemic Control (2015)  Target Ranges:  Prepandial:   less than 140 mg/dL      Peak postprandial:   less than 180 mg/dL (1-2 hours)      Critically ill patients:  140 - 180 mg/dL   Lab Results  Component Value Date   GLUCAP 171 (H) 03/14/2017   HGBA1C 11.9 (H) 03/13/2017    Review of Glycemic Control  Called [facility] and made appt for f/u on December 6th, 2018, at 10:30 am.  Instructed pt to go to [facility] to get prescriptions for Lantus and Novolog.  Discussed hypoglycemia s/s and treatment. Pt states he still has sore throat and cannot swallow food very well. Discussed checking blood sugars 3-4x/day and recording in logbook. Take logbook to MD appt on 12/6.  Pt states he understands and will take meds as prescribed.  Discharge meds: Lantus 25 units QHS Novolog 5 units tidwc  Discussed with RN.  Thank you. Ailene Ards, RD, LDN, CDE Inpatient Diabetes Coordinator [phone]

## 2017-03-14 NOTE — Progress Notes (Signed)
Date: March 14, 2017 Chart review for discharge needs:  None found for case management. Patient has no questions concerning post hospital care.

## 2017-03-14 NOTE — Progress Notes (Signed)
PHARMACIST - PHYSICIAN COMMUNICATION  DR:  TRH  CONCERNING: IV to Oral Route Change Policy  RECOMMENDATION: This patient is receiving famotidine and thiamine by the intravenous route.  Based on criteria approved by the Pharmacy and Therapeutics Committee, the intravenous medication(s) is/are being converted to the equivalent oral dose form(s).   DESCRIPTION: These criteria include:  The patient is eating (either orally or via tube) and/or has been taking other orally administered medications for a least 24 hours  The patient has no evidence of active gastrointestinal bleeding or impaired GI absorption (gastrectomy, short bowel, patient on TNA or NPO).  If you have questions about this conversion, please contact the Pharmacy Department  []   [phone] )  [facility]ani Hawking []   [phone] )  [facility] []   [phone] )  [facility] []   [phone] )  [facility] [x]   [phone] )  [facility]   Juliette Alcide [place], [facility] 03/14/2017 10:09 AM

## 2017-03-15 LAB — CULTURE, GROUP A STREP (THRC)

## 2017-03-16 ENCOUNTER — Telehealth: Payer: Self-pay

## 2017-03-16 NOTE — Telephone Encounter (Signed)
The patient has an appointment scheduled at [facility] on 03/22/17. Update provided to Eldridge Abrahams, RN CM

## 2017-03-18 LAB — CULTURE, BLOOD (ROUTINE X 2)
CULTURE: NO GROWTH
CULTURE: NO GROWTH
SPECIAL REQUESTS: ADEQUATE
Special Requests: ADEQUATE

## 2017-03-22 ENCOUNTER — Ambulatory Visit (INDEPENDENT_AMBULATORY_CARE_PROVIDER_SITE_OTHER): Payer: Self-pay | Admitting: Physician Assistant

## 2017-03-22 ENCOUNTER — Encounter (INDEPENDENT_AMBULATORY_CARE_PROVIDER_SITE_OTHER): Payer: Self-pay | Admitting: Physician Assistant

## 2017-03-22 VITALS — BP 117/76 | HR 97 | Temp 98.1°F | Resp 18 | Ht 67.0 in | Wt 142.0 lb

## 2017-03-22 DIAGNOSIS — F418 Other specified anxiety disorders: Secondary | ICD-10-CM

## 2017-03-22 DIAGNOSIS — E1069 Type 1 diabetes mellitus with other specified complication: Secondary | ICD-10-CM

## 2017-03-22 MED ORDER — "INSULIN SYRINGE 30G X 1/2"" 0.5 ML MISC": 0 refills | Status: DC

## 2017-03-22 MED ORDER — INSULIN GLARGINE 100 UNIT/ML ~~LOC~~ SOLN: 25.0000 [IU] | Freq: Every day | SUBCUTANEOUS | 1 refills | Status: DC

## 2017-03-22 MED ORDER — INSULIN ASPART 100 UNIT/ML ~~LOC~~ SOLN: 5.0000 [IU] | Freq: Three times a day (TID) | SUBCUTANEOUS | 0 refills | Status: DC

## 2017-03-22 MED ORDER — ESCITALOPRAM OXALATE 10 MG PO TABS: 10.0000 mg | ORAL_TABLET | Freq: Every day | ORAL | 1 refills | Status: DC

## 2017-03-22 MED FILL — TRUEPLUS SYR 0.5ML 30GX5/16: 30G X 5/16" | 30 days supply | Qty: 100 | Fill #0

## 2017-03-22 MED FILL — ESCITALOPRAM 10 MG TABLET: 10 | 30 days supply | Qty: 30 | Fill #0

## 2017-03-22 NOTE — Patient Instructions (Addendum)
Community Resources  Advocacy/Legal Legal Aid [place]:  1-866-219-5262  /  336-272-0148  Family Justice Center:  336-641-7233  [facility] [place] 24-hr Crisis line:  336-273-7273  Women's Resource Center, [place]:  336-275-6090  Court Watch (custody):  336-275-2346  [place] [facility]:   336-279-9299    Baby & Breastfeeding Car Seat Inspection @ Various [place] Fire Depts.- call 336-373-2177  [place] [facility]  336-832-6860  [facility] 336-878-6712  WIC: 336-641-3663 ([place]);  336-641-7571 ([place])  La Leche League:  1-877-452-5321   Childcare [place] [facility] Development: 336-369-5097 ([place]) / 336-887-8224 ([place])  - Child Care Resources/ Referrals/ Scholarships  - Head Start/ Early Head Start (call or apply online)  [place] DHHS: [place] Pre-K :  1-800-859-0829 / 336-274-5437   Employment / Job Search [facility] [place]: 336-275-6090 / [address]  [place] [employer] Career Center (JobLink): 336-373-5922 ([place]) / 336-882-4141 ([place])  [facility] Resource/ Career Center: 336-275-9801 / 336-282-7307  [employer] Job & Career Center: 336-373-3764  DHHS Work First: 336-641-3447 ([place]) / 336-641-3447 ([place])  [facility] [place]:  336-676-5871   Financial Assistance [place] [facility]:  336-553-2657  [employer]: 336-235-0368  [employer] (furniture):  336-370-4002  [place] Helping Hands: 336-373-4264  Low Income Energy Assistance  336-641-3000   Food Assistance DHHS- SNAP/ Food Stamps: 336-641-4588  WIC: [place]- 336-641-3663 ;  [place] 336-641-7571  Little Green Book- Free Meals  Little Blue Book- Free Food Pantries  During the summer, text "FOOD" to 877877   General Health / Clinics (Adults) Orange Card (for Adults) through [place] [facility]: (336) 895-4900  [facility]:   336-832-8035  [facility]:   336-832-4444  Health Department:  336-641-3245  [facility]:  336-415-3877 / 336-641-2100  Planned Parenthood of [place]:   336-373-0678  [facility]:   336-334-4822 x [address]   Housing [place] Housing Coalition:   336-691-9521  [place] Housing Authority:  336-275-8501  Affordable Housing Managemnt:  336-273-0568   Immigrant/ Refugee Center for [place]ians (UNCG):  336-256-1065  Faith Action International House:  336-379-0037  [employer] Institute:  336-937-4701  [employer] World Services:  336-617-0381  African Services Coalition:  336-574-2677   LGBTQ YouthSAFE  www.youthsafegso.org  PFLAG  336-541-6754 / info@[place].org  The Trevor Project:  1-866-488-7386   Mental Health/ Substance Use [facility] [place]  336-387-6161  [facility]:  336-832-9700 or 1-800-711-2635  Carter's Circle of Care:  336-271-5888  [facility]:  336-294-1349  [facility] Care Services:  336-542-2884  [facility] (walk-ins)  336-676-6840 / [address]  [place]:  800-449-1287  Alcoholics Anonymous:  336-854-4278  Narcotics Anonymous:  800-365-1036  Quit Smoking Hotline:  800-QUIT-NOW (800-784-8669)   Parenting Children's Home Society:  800-632-1400  [place]: Education Center & Support Groups:  336-832-6682  YWCA: 336-273-3461  [employer]CG: Bringing Out the Best:  336-334-3120               Thriving at Three (Hispanic families): 336-256-1066  Healthy Start ([facility] [place]):  336-387-6161 x2288  Parents as Teachers:  336-691-0024  [place] Child Development- Learning Together (Immigrants): 336-369-5001   Poison Control 800-222-1222  Sports & Recreation [employer] Open Doors Application: ymcanwnc.org/join/open-doors-financial-assistance/  City of [place] Recreation Centers: http://www.[place].gov/index.aspx?page=3615   Special Needs Family Support Network:  336-832-6507  Autism Society of :   336-333-0197 x1402 or x1412 /  800-785-1035  TEACCH [place]:  336-334-5773     ARC of [place]:  (208) 219-8011314-840-5043  Children's Developmental Service Agency (CDSA):  (301) 684-4877201-480-0664  [facility] (Care Coordination for Children):  843-166-9034(504)298-1887   Transportation Medicaid Transportation: 506-871-2363435 365 1255 to apply  [place] Transit Authority: 7018795284(346)786-6341 (reduced-fare bus ID to Medicaid/ Medicare/ Orange Card)  SCAT Paratransit services: Eligible riders only, call [phone] [phone] for application   Tutoring/Mentoring Black Child Development Institute: (571)378-6008  [facility]Big Brothers/ Big Sisters: 480-676-0472214-499-0333 Manley Mason([place])  (256) [phone] ([place])  ACES through child's school: 724-727-5623  [employer] Achievers: contact your local [place] DysY  SHIELD Mentor Program: 779-297-8144616-467-1478     Diabetes Mellitus and Food It is important for you to manage your blood sugar (glucose) level. Your blood glucose level can be greatly affected by what you eat. Eating healthier foods in the appropriate amounts throughout the day at about the same time each day will help you control your blood glucose level. It can also help slow or prevent worsening of your diabetes mellitus. Healthy eating may even help you improve the level of your blood pressure and reach or maintain a healthy weight. General recommendations for healthful eating and cooking habits include:  Eating meals and snacks regularly. Avoid going long periods of time without eating to lose weight.  Eating a diet that consists mainly of plant-based foods, such as fruits, vegetables, nuts, legumes, and whole grains.  Using low-heat cooking methods, such as baking, instead of high-heat cooking methods, such as deep frying.  Work with your dietitian to make sure you understand how to use the Nutrition Facts information on food labels. How can food affect me? Carbohydrates Carbohydrates affect your blood glucose level more than any other type of food. Your dietitian will help you determine how many carbohydrates to eat at each meal and teach you how to count carbohydrates. Counting  carbohydrates is important to keep your blood glucose at a healthy level, especially if you are using insulin or taking certain medicines for diabetes mellitus. Alcohol Alcohol can cause sudden decreases in blood glucose (hypoglycemia), especially if you use insulin or take certain medicines for diabetes mellitus. Hypoglycemia can be a life-threatening condition. Symptoms of hypoglycemia (sleepiness, dizziness, and disorientation) are similar to symptoms of having too much alcohol. If your health care provider has given you approval to drink alcohol, do so in moderation and use the following guidelines:  Women should not have more than one drink per day, and men should not have more than two drinks per day. One drink is equal to: ? 12 oz of beer. ? 5 oz of wine. ? 1 oz of hard liquor.  Do not drink on an empty stomach.  Keep yourself hydrated. Have water, diet soda, or unsweetened iced tea.  Regular soda, juice, and other mixers might contain a lot of carbohydrates and should be counted.  What foods are not recommended? As you make food choices, it is important to remember that all foods are not the same. Some foods have fewer nutrients per serving than other foods, even though they might have the same number of calories or carbohydrates. It is difficult to get your body what it needs when you eat foods with fewer nutrients. Examples of foods that you should avoid that are high in calories and carbohydrates but low in nutrients include:  Trans fats (most processed foods list trans fats on the Nutrition Facts label).  Regular soda.  Juice.  Candy.  Sweets, such as cake, pie, doughnuts, and cookies.  Fried foods.  What foods can I eat? Eat nutrient-rich foods, which will nourish your body  and keep you healthy. The food you should eat also will depend on several factors, including:  The calories you need.  The medicines you take.  Your weight.  Your blood glucose level.  Your  blood pressure level.  Your cholesterol level.  You should eat a variety of foods, including:  Protein. ? Lean cuts of meat. ? Proteins low in saturated fats, such as fish, egg whites, and beans. Avoid processed meats.  Fruits and vegetables. ? Fruits and vegetables that may help control blood glucose levels, such as apples, mangoes, and yams.  Dairy products. ? Choose fat-free or low-fat dairy products, such as milk, yogurt, and cheese.  Grains, bread, pasta, and rice. ? Choose whole grain products, such as multigrain bread, whole oats, and brown rice. These foods may help control blood pressure.  Fats. ? Foods containing healthful fats, such as nuts, avocado, olive oil, canola oil, and fish.  Does everyone with diabetes mellitus have the same meal plan? Because every person with diabetes mellitus is different, there is not one meal plan that works for everyone. It is very important that you meet with a dietitian who will help you create a meal plan that is just right for you. This information is not intended to replace advice given to you by your health care provider. Make sure you discuss any questions you have with your health care provider. Document Released: 12/29/2004 Document Revised: 09/09/2015 Document Reviewed: 02/28/2013 Elsevier Interactive Patient Education  2017 [employer].

## 2017-03-22 NOTE — Progress Notes (Signed)
Subjective:  Patient ID: [name], male    DOB: October 22, 1975  Age: 41 y.o. MRN: [id number]  CC: hospitalization f/u   HPI [name] is a 41 y.o. male with a medical history of CKD, DM1, polysubstance abuse. Presents on Hospital f/u for DKA. A1c 11.9% in the hospital. Reports blood sugar from 80 to high 300s. He is currently taking Lantus 25 units and Novolog 5 units. Feels moderately better. Patient is also very depressed since the death of his son four months ago. Has not seen a psychologist or taken any psychotropics. Suicidal ideation at one point but has found renewed hope in taking care of his grandson.     Outpatient Medications Prior to Visit  Medication Sig Dispense Refill  . blood glucose meter kit and supplies KIT Dispense based on patient and insurance preference. Use up to four times daily as directed. (FOR ICD-9 250.00, 250.01). 1 each 0  . ibuprofen (ADVIL,MOTRIN) 200 MG tablet Take 800 mg by mouth every 6 (six) hours as needed for mild pain or moderate pain.    Marland Kitchen insulin aspart (NOVOLOG) 100 UNIT/ML injection Inject 5 Units into the skin 3 (three) times daily with meals. 10 mL 0  . insulin glargine (LANTUS) 100 UNIT/ML injection Inject 0.25 mLs (25 Units total) into the skin at bedtime. 10 mL 1  . Insulin Syringe-Needle U-100 (INSULIN SYRINGE .5CC/30GX1/2") 30G X 1/2" 0.5 ML MISC Use 1-3 times with insulin 50 each 0  . menthol-cetylpyridinium (CEPACOL) 3 MG lozenge Take 1 lozenge (3 mg total) by mouth as needed for sore throat. 100 tablet 0  . ondansetron (ZOFRAN) 4 MG tablet Take 1 tablet (4 mg total) by mouth every 8 (eight) hours as needed for nausea or vomiting. 20 tablet 0   No facility-administered medications prior to visit.      ROS Review of Systems  Constitutional: Negative for chills, fever and malaise/fatigue.  Eyes: Negative for blurred vision.  Respiratory: Negative for shortness of breath.   Cardiovascular: Negative for chest pain and  palpitations.  Gastrointestinal: Negative for abdominal pain and nausea.  Genitourinary: Negative for dysuria and hematuria.  Musculoskeletal: Negative for joint pain and myalgias.  Skin: Negative for rash.  Neurological: Negative for tingling and headaches.  Psychiatric/Behavioral: Negative for depression. The patient is not nervous/anxious.     Objective:  BP 117/76 (BP Location: Left Arm, Patient Position: Sitting, Cuff Size: Normal)   Pulse 97   Temp 98.1 F (36.7 C) (Oral)   Resp 18   Ht 5' 7"  (1.702 m)   Wt 142 lb (64.4 kg)   SpO2 100%   BMI 22.24 kg/m   BP/Weight 03/22/2017 03/14/2017 05/28/1733  Systolic BP 670 141 -  Diastolic BP 76 87 -  Wt. (Lbs) 142 - 129  BMI 22.24 - 19.91      Physical Exam  Constitutional: He is oriented to person, place, and time.  Well developed, well nourished, NAD, polite  HENT:  Head: Normocephalic and atraumatic.  Eyes: No scleral icterus.  Neck: Normal range of motion. Neck supple. No thyromegaly present.  Cardiovascular: Normal rate, regular rhythm and normal heart sounds.  Pulmonary/Chest: Effort normal and breath sounds normal.  Musculoskeletal: He exhibits no edema.  Neurological: He is alert and oriented to person, place, and time. No cranial nerve deficit. Coordination normal.  Skin: Skin is warm and dry. No rash noted. No erythema. No pallor.  Psychiatric: His behavior is normal. Thought content normal.  Constricted affect. Mood is mildly  depressed, more apparent in communication than in body language  Vitals reviewed.    Assessment & Plan:    1. Type 1 diabetes mellitus with other specified complication (HCC) - insulin aspart (NOVOLOG) 100 UNIT/ML injection; Inject 5 Units into the skin 3 (three) times daily with meals.  Dispense: 10 mL; Refill: 0 - insulin glargine (LANTUS) 100 UNIT/ML injection; Inject 0.25 mLs (25 Units total) into the skin at bedtime.  Dispense: 10 mL; Refill: 1 - Insulin Syringe-Needle U-100  (INSULIN SYRINGE .5CC/30GX1/2") 30G X 1/2" 0.5 ML MISC; Use 1-3 times with insulin  Dispense: 50 each; Refill: 0  2. Depression with anxiety - escitalopram (LEXAPRO) 10 MG tablet; Take 1 tablet (10 mg total) by mouth daily.  Dispense: 30 tablet; Refill: 1   Meds ordered this encounter  Medications  . insulin aspart (NOVOLOG) 100 UNIT/ML injection    Sig: Inject 5 Units into the skin 3 (three) times daily with meals.    Dispense:  10 mL    Refill:  0    Order Specific Question:   Supervising Provider    Answer:   Tresa Garter [id number]  . insulin glargine (LANTUS) 100 UNIT/ML injection    Sig: Inject 0.25 mLs (25 Units total) into the skin at bedtime.    Dispense:  10 mL    Refill:  1    Order Specific Question:   Supervising Provider    Answer:   Tresa Garter [id number]  . Insulin Syringe-Needle U-100 (INSULIN SYRINGE .5CC/30GX1/2") 30G X 1/2" 0.5 ML MISC    Sig: Use 1-3 times with insulin    Dispense:  50 each    Refill:  0    Order Specific Question:   Supervising Provider    Answer:   Tresa Garter [id number]  . escitalopram (LEXAPRO) 10 MG tablet    Sig: Take 1 tablet (10 mg total) by mouth daily.    Dispense:  30 tablet    Refill:  1    Order Specific Question:   Supervising Provider    Answer:   Tresa Garter [id number]    Follow-up: Return in about 4 weeks (around 04/19/2017) for F/u DM and depression.   Clent Demark PA

## 2017-03-23 ENCOUNTER — Telehealth: Payer: Self-pay | Admitting: Licensed Clinical Social Worker

## 2017-03-23 NOTE — Telephone Encounter (Signed)
LCSWA placed call to pt to follow up on behavioral health needs. A HIPPA compliant message was left requesting a return call.

## 2017-03-23 NOTE — Telephone Encounter (Signed)
LCSWA placed call to pt to follow up on behavioral health needs. Pt scheduled appointment for 03/29/17 to complete assessment.

## 2017-03-29 ENCOUNTER — Ambulatory Visit: Payer: Self-pay | Attending: Physician Assistant

## 2017-03-29 ENCOUNTER — Ambulatory Visit: Payer: Self-pay | Admitting: Licensed Clinical Social Worker

## 2017-03-29 DIAGNOSIS — F4323 Adjustment disorder with mixed anxiety and depressed mood: Secondary | ICD-10-CM

## 2017-03-30 NOTE — BH Specialist Note (Signed)
Integrated Behavioral Health Initial Visit  MRN: [id number] Name: [name]  Number of Integrated Behavioral Health Clinician visits:: 1/6 Session Start time: 9:15 AM  Session End time: 10:00 AM Total time: 45 minutes  Type of Service: Integrated Behavioral Health- Individual/Family Interpretor:No. Interpretor Name and Language: N/A   Warm Hand Off Completed.       SUBJECTIVE: Donyel Hamlett is a 41 y.o. male accompanied by self Patient was referred by Conley Simmonds for depression and anxiety. Patient reports the following symptoms/concerns: overwhelming feelings of sadness and worry, difficulty sleeping, low energy, decreased concentration, irritability, and hx of suicidal ideations Duration of problem: Ongoing; Severity of problem: severe  OBJECTIVE: Mood: Depressed and Affect: Appropriate Risk of harm to self or others: No plan to harm self or others Pt reports hx of suicidal ideations with no plan to harm self or others  LIFE CONTEXT: Family and Social: Pt resides with long-time partner and his three year old grandson. He has family that resides nearby School/Work: Pt reports difficulty maintaining employment. His partner is employed Self-Care: Pt smokes cigarettes (3-4 daily) and marijuana "occasionally" to cope with stressors  Life Changes: Pt is grieving the recent death of his son  GOALS ADDRESSED: Patient will: 1. Reduce symptoms of: anxiety and depression 2. Increase knowledge and/or ability of: coping skills  3. Demonstrate ability to: Increase healthy adjustment to current life circumstances and Increase adequate support systems for patient/family  INTERVENTIONS: Interventions utilized: Supportive Counseling, Psychoeducation and/or Health Education and Link to [employer]  Standardized Assessments completed: Not Needed  ASSESSMENT: Patient currently experiencing depression and anxiety triggered by the recent death of his son. He reports overwhelming  feelings of sadness and worry, difficulty sleeping, low energy, decreased concentration, irritability, and hx of suicidal ideations. Pt denied current plan or intent to harm self or others. LCSWA discussed protective factors with pt and discussed safety plan. Crisis resources were provided.   Patient may benefit from psychoeducation and psychotherapy. He is participating in medication management with PCP. LCSWA educated pt on the stages of grief, provided validation, and encouragement. Pt was provided grief support resources and informed of various services offered through Hospice.    PLAN: 1. Follow up with behavioral health clinician on : Pt was encouraged to contact LCSWA if symptoms worsen or fail to improve to schedule behavioral appointments at [facility] 2. Behavioral recommendations: LCSWA recommends that pt apply healthy coping skills discussed, comply with medication management, and utilize grief support services. Pt is encouraged to schedule follow up appointment with LCSWA 3. Referral(s): Integrated [employer] (In Clinic) and Counselor 4. "From scale of 1-10, how likely are you to follow plan?": 9/10  Bridgett Larsson, LCSW 03/30/17 11:04 AM

## 2017-04-23 MED FILL — !NOVOLOG 100UNITS/ML VIAL: 100/ML | 28 days supply | Qty: 10 | Fill #0

## 2017-04-23 MED FILL — !LANTUS 100 UNITS/ML VIAL: 100 | 28 days supply | Qty: 10 | Fill #1

## 2017-04-25 ENCOUNTER — Ambulatory Visit (INDEPENDENT_AMBULATORY_CARE_PROVIDER_SITE_OTHER): Payer: Self-pay | Admitting: Physician Assistant

## 2017-05-02 ENCOUNTER — Ambulatory Visit (INDEPENDENT_AMBULATORY_CARE_PROVIDER_SITE_OTHER): Payer: Self-pay | Admitting: Physician Assistant

## 2017-05-02 ENCOUNTER — Encounter (INDEPENDENT_AMBULATORY_CARE_PROVIDER_SITE_OTHER): Payer: Self-pay | Admitting: Physician Assistant

## 2017-05-02 ENCOUNTER — Other Ambulatory Visit: Payer: Self-pay

## 2017-05-02 VITALS — BP 134/91 | HR 94 | Temp 97.7°F | Wt 145.2 lb

## 2017-05-02 DIAGNOSIS — E119 Type 2 diabetes mellitus without complications: Secondary | ICD-10-CM

## 2017-05-02 DIAGNOSIS — F418 Other specified anxiety disorders: Secondary | ICD-10-CM

## 2017-05-02 DIAGNOSIS — Z794 Long term (current) use of insulin: Secondary | ICD-10-CM

## 2017-05-02 MED ORDER — CLONAZEPAM 0.5 MG PO TABS: 0.5000 mg | ORAL_TABLET | Freq: Every day | ORAL | 0 refills | Status: DC

## 2017-05-02 MED ORDER — ESCITALOPRAM OXALATE 20 MG PO TABS: 20.0000 mg | ORAL_TABLET | Freq: Every day | ORAL | 2 refills | Status: DC

## 2017-05-02 NOTE — Patient Instructions (Signed)
Community Resources  Advocacy/Legal Legal Aid [place]:  1-866-219-5262  /  336-272-0148  Family Justice Center:  336-641-7233  [facility] [place] 24-hr Crisis line:  336-273-7273  Women's Resource Center, [place]:  336-275-6090  Court Watch (custody):  336-275-2346  [place] [facility]:   336-279-9299    Baby & Breastfeeding Car Seat Inspection @ Various [place] Fire Depts.- call 336-373-2177  [place] [facility]  336-832-6860  [facility] 336-878-6712  WIC: 336-641-3663 ([place]);  336-641-7571 ([place])  La Leche League:  1-877-452-5321   Childcare [place] [facility]: 336-369-5097 ([place]) / 336-887-8224 ([place])  - Child Care Resources/ Referrals/ Scholarships  - Head Start/ Early Head Start (call or apply online)  [place] DHHS: [place] Pre-K :  1-800-859-0829 / 336-274-5437   Employment / Job Search [facility] [place]: 336-275-6090 / [address]  [place] [facility] [employer] [facility] (JobLink): 336-373-5922 ([place]) / 336-882-4141 ([place])  [facility] Community Resource/ Career Center: 336-275-9801 / 336-282-7307  [facility] Job & Career Center: 336-373-3764  DHHS Work First: 336-641-3447 ([place]) / 336-641-3447 ([place])  Step[facility] [place]:  336-676-5871   Financial Assistance [place] [facility]:  336-553-2657  [employer]: 336-235-0368  [employer] Network (furniture):  336-370-4002  [place] Hel[facility] Hands: 336-373-4264  Low Income Energy Assistance  336-641-3000   Food Assistance DHHS- SNAP/ Food Stamps: 336-641-4588  WIC: [place]- 336-641-3663 ;  [place] 336-641-7571  Little Green Book- Free Meals  Little Blue Book- Free Food Pantries  During the summer, text "FOOD" to 877877   General Health / Clinics (Adults) Orange Card (for Adults) through [place] [facility]: (336) 895-4900  [facility]:   336-832-8035  [place] [facility]:   336-832-4444  Health Department:  336-641-3245  [place]:  336-415-3877 / 336-641-2100  Planned Parenthood of [place]:   336-373-0678  [facility]:   336-334-4822 x 50251   Housing [place] Housing Coalition:   336-691-9521  [place] Housing Authority:  336-275-8501  Affordable Housing Managemnt:  336-273-0568   Immigrant/ Refugee Center for [place]ians (UNCG):  336-256-1065  Faith Action International [facility]:  336-379-0037  [employer]rivals Institute:  336-937-4701  Church World Services:  336-617-0381  African Services Coalition:  336-574-2677   LGBTQ YouthSAFE  www.youthsafe[place]so.org  PFLAG  336-541-6754 / info@[place].org  The Trevor Project:  1-866-488-7386   Mental Health/ Substance Use [facility] [place]  336-387-6161  [facility]:  336-832-9700 or 1-800-711-2635  Carter's Circle of Care:  336-271-5888  [facility]:  336-294-1349  [facility] Care Services:  336-542-2884  [facility] (walk-ins)  336-676-6840 / [address]  [place]:  800-449-1287  Alcoholics Anonymous:  336-854-4278  Narcotics Anonymous:  800-365-1036  Quit Smoking Hotline:  800-QUIT-NOW (800-784-8669)   Parenting Children's Home Society:  800-632-1400  [place]: Education Center & Support Groups:  336-832-6682  YWCA: 336-273-3461  UNCG: Bringing Out the Best:  336-334-3120               Thriving at Three (Hispanic families): 336-256-1066  Healthy Start ([facility] [place]):  336-387-6161 x2288  Parents as Teachers:  336-691-0024  [place] Child Development- Learning Together (Immigrants): 336-369-5001   Poison Control 800-222-1222  Sports & Recreation [employer] Open Doors Application: ymcanwnc.org/join/open-doors-financial-assistance/  City of [place] Recreation Centers: http://www.[place].gov/index.aspx?page=3615   Special Needs Family Support Network:  336-832-6507  Autism Society of :   336-333-0197 x1402 or x1412 /  800-785-1035  [facility] [place]:  336-334-5773     ARC of [place]:  (580)168-3986  Children's Developmental Service Agency (CDSA):  (502)332-9236  [facility] (Care Coordination for Children):  915 657 8990   Transportation Medicaid Transportation: 337 462 3455 to apply  [place] Authority: 684-587-5957 (reduced-fare bus ID to Medicaid/ Medicare/ Orange Card)  SCAT Paratransit services: Eligible riders only, call 573-650-8359 for application   Tutoring/Mentoring Black Child Development Institute: 518 456 1631  [facility] Brothers/ Big Sisters: 306-627-0732 539-408-4101 ([place])  ACES through child's school: 830-629-5234  [employer] Achievers: contact your local [place] Dys Mentor Program: 8314356420     Major Depressive Disorder, Adult Major depressive disorder (MDD) is a mental health condition. MDD often makes you feel sad, hopeless, or helpless. MDD can also cause symptoms in your body. MDD can affect your:  Work.  School.  Relationships.  Other normal activities.  MDD can range from mild to very bad. It may occur once (single episode MDD). It can also occur many times (recurrent MDD). The main symptoms of MDD often include:  Feeling sad, depressed, or irritable most of the time.  Loss of interest.  MDD symptoms also include:  Sleeping too much or too little.  Eating too much or too little.  A change in your weight.  Feeling tired (fatigue) or having low energy.  Feeling worthless.  Feeling guilty.  Trouble making decisions.  Trouble thinking clearly.  Thoughts of suicide or harming others.  Feeling weak.  Feeling agitated.  Keeping yourself from being around other people (isolation).  Follow these instructions at home: Activity  Do these things as told by your doctor: ? Go back to your normal activities. ? Exercise regularly. ? Spend time outdoors. Alcohol  Talk with your doctor about how alcohol can affect your antidepressant medicines.  Do not drink alcohol. Or, limit how much alcohol you  drink. ? This means no more than 1 drink a day for nonpregnant women and 2 drinks a day for men. One drink equals one of these:  12 oz of beer.  5 oz of wine.  1 oz of hard liquor. General instructions  Take over-the-counter and prescription medicines only as told by your doctor.  Eat a healthy diet.  Get plenty of sleep.  Find activities that you enjoy. Make time to do them.  Think about joining a support group. Your doctor may be able to suggest a group for you.  Keep all follow-up visits as told by your doctor. This is important. Where to find more information:  The [employer] American on Mental Illness: ? www.nami.org  U.S. [employer] of Mental Health: ? http://www.maynard.net/  National Suicide Prevention Lifeline: ? 434-539-6824. This is free, 24-hour help. Contact a doctor if:  Your symptoms get worse.  You have new symptoms. Get help right away if:  You self-harm.  You see, hear, taste, smell, or feel things that are not present (hallucinate). If you ever feel like you may hurt yourself or others, or have thoughts about taking your own life, get help right away. You can go to your nearest emergency department or call:  Your local emergency services (911 in the U.S.).  A suicide crisis helpline, such as the National Suicide Prevention Lifeline: ? 262-383-5982. This is open 24 hours a day.  This information is not intended to replace advice given to you by your health care provider. Make sure you discuss any questions you have with your health care provider. Document Released: 03/15/2015 Document Revised: 12/19/2015 Document Reviewed: 12/19/2015 Elsevier Interactive Patient Education  2017 [employer].
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
## 2017-05-02 NOTE — Progress Notes (Signed)
Pt complains of pain in ear, he is unable to sleep on his right ear  Pt complains of foot pain and leg pain

## 2017-05-03 LAB — COMPREHENSIVE METABOLIC PANEL
ALBUMIN: 4.1 g/dL (ref 3.5–5.5)
ALT: 29 IU/L (ref 0–44)
AST: 26 IU/L (ref 0–40)
Albumin/Globulin Ratio: 1.9 (ref 1.2–2.2)
Alkaline Phosphatase: 92 IU/L (ref 39–117)
BUN / CREAT RATIO: 8 — AB (ref 9–20)
BUN: 7 mg/dL (ref 6–24)
CHLORIDE: 102 mmol/L (ref 96–106)
CO2: 26 mmol/L (ref 20–29)
CREATININE: 0.84 mg/dL (ref 0.76–1.27)
Calcium: 8.9 mg/dL (ref 8.7–10.2)
GFR calc non Af Amer: 107 mL/min/{1.73_m2} (ref >59)
GFR, EST AFRICAN AMERICAN: 124 mL/min/{1.73_m2} (ref >59)
GLUCOSE: 81 mg/dL (ref 65–99)
Globulin, Total: 2.2 g/dL (ref 1.5–4.5)
Potassium: 4.6 mmol/L (ref 3.5–5.2)
Sodium: 145 mmol/L — ABNORMAL HIGH (ref 134–144)
TOTAL PROTEIN: 6.3 g/dL (ref 6.0–8.5)

## 2017-05-07 ENCOUNTER — Telehealth: Payer: Self-pay

## 2017-05-07 NOTE — Telephone Encounter (Signed)
-----   Message from Roger David Gomez, PA-C sent at 05/03/2017  6:23 PM EST ----- Liver and kidney results are normal. 

## 2017-05-07 NOTE — Telephone Encounter (Signed)
Called patient, no answer. Will try again before mailing results. Maryjean Morn, CMA

## 2017-05-08 ENCOUNTER — Telehealth (INDEPENDENT_AMBULATORY_CARE_PROVIDER_SITE_OTHER): Payer: Self-pay

## 2017-05-08 ENCOUNTER — Encounter (INDEPENDENT_AMBULATORY_CARE_PROVIDER_SITE_OTHER): Payer: Self-pay

## 2017-05-08 NOTE — Telephone Encounter (Signed)
Unable to reach patient, results will be mailed. [name], CMA

## 2017-05-08 NOTE — Telephone Encounter (Signed)
-----   Message from Loletta Specter, PA-C sent at 05/03/2017  6:23 PM EST ----- Liver and kidney results are normal.

## 2017-06-04 MED FILL — $LANTUS 100 UNITS/ML VIAL: 100 | 28 days supply | Qty: 10 | Fill #0

## 2017-06-13 ENCOUNTER — Ambulatory Visit (INDEPENDENT_AMBULATORY_CARE_PROVIDER_SITE_OTHER): Payer: Self-pay | Admitting: Physician Assistant

## 2017-07-20 ENCOUNTER — Other Ambulatory Visit (INDEPENDENT_AMBULATORY_CARE_PROVIDER_SITE_OTHER): Payer: Self-pay | Admitting: Physician Assistant

## 2017-07-20 DIAGNOSIS — E1069 Type 1 diabetes mellitus with other specified complication: Secondary | ICD-10-CM

## 2017-07-20 MED ORDER — INSULIN ASPART 100 UNIT/ML ~~LOC~~ SOLN: 5.0000 [IU] | Freq: Three times a day (TID) | SUBCUTANEOUS | 2 refills | Status: DC

## 2017-07-20 MED FILL — NovoLOG 100 UNIT/ML SOLN: 100 | 28 days supply | Qty: 10 | Fill #0

## 2017-07-20 MED FILL — $LANTUS 100 UNITS/ML VIAL: 100 | 28 days supply | Qty: 10 | Fill #1

## 2017-09-05 ENCOUNTER — Other Ambulatory Visit (INDEPENDENT_AMBULATORY_CARE_PROVIDER_SITE_OTHER): Payer: Self-pay | Admitting: Physician Assistant

## 2017-09-05 DIAGNOSIS — E1069 Type 1 diabetes mellitus with other specified complication: Secondary | ICD-10-CM

## 2017-09-05 MED FILL — !NOVOLOG 100UNITS/ML VIAL: 100/ML | 28 days supply | Qty: 10 | Fill #1

## 2017-09-05 NOTE — Telephone Encounter (Signed)
FWD to covering provider at [facility]. Tempestt S Roberts, CMA  

## 2017-09-07 MED FILL — $LANTUS 100 UNITS/ML VIAL: 100 | 28 days supply | Qty: 10 | Fill #0

## 2017-10-22 ENCOUNTER — Other Ambulatory Visit (INDEPENDENT_AMBULATORY_CARE_PROVIDER_SITE_OTHER): Payer: Self-pay | Admitting: Physician Assistant

## 2017-10-22 DIAGNOSIS — E1069 Type 1 diabetes mellitus with other specified complication: Secondary | ICD-10-CM

## 2017-10-22 NOTE — Telephone Encounter (Signed)
FWD to PCP. Tempestt S Roberts, CMA  

## 2017-10-23 MED FILL — $LANTUS 100 UNITS/ML VIAL: 100 | 28 days supply | Qty: 10 | Fill #0

## 2017-11-21 ENCOUNTER — Other Ambulatory Visit (INDEPENDENT_AMBULATORY_CARE_PROVIDER_SITE_OTHER): Payer: Self-pay | Admitting: Physician Assistant

## 2017-11-21 DIAGNOSIS — E1069 Type 1 diabetes mellitus with other specified complication: Secondary | ICD-10-CM

## 2017-11-21 MED FILL — !NOVOLOG 100UNITS/ML VIAL: 100/ML | 28 days supply | Qty: 10 | Fill #2

## 2017-11-21 NOTE — Telephone Encounter (Signed)
FWD to PCP. Tempestt S Roberts, CMA  

## 2017-11-22 MED FILL — $LANTUS 100 UNITS/ML VIAL: 100 | 28 days supply | Qty: 10 | Fill #0

## 2018-01-09 ENCOUNTER — Other Ambulatory Visit (INDEPENDENT_AMBULATORY_CARE_PROVIDER_SITE_OTHER): Payer: Self-pay | Admitting: Physician Assistant

## 2018-01-09 DIAGNOSIS — E1069 Type 1 diabetes mellitus with other specified complication: Secondary | ICD-10-CM

## 2018-01-09 NOTE — Telephone Encounter (Signed)
FWD to PCP. Tempestt S Roberts, CMA  

## 2018-01-09 NOTE — Telephone Encounter (Signed)
I believe I refilled last time for one month and asked for patient to return for f/u. Please contact patient and ask if he will continue with [facility] or if he has another PCP/specialist.

## 2018-01-10 ENCOUNTER — Telehealth (INDEPENDENT_AMBULATORY_CARE_PROVIDER_SITE_OTHER): Payer: Self-pay | Admitting: Physician Assistant

## 2018-01-10 NOTE — Telephone Encounter (Signed)
Left message asking patient to return call to [facility] at [phone]. Maryjean Morn, CMA

## 2018-01-10 NOTE — Telephone Encounter (Signed)
Patient states he has been going through a lot in life. He still wishes to be seen here. He has scheduled to be seen on 01/28/18. Maryjean Morn, CMA

## 2018-01-10 NOTE — Telephone Encounter (Signed)
Patient called to get update on medication refill for insulin. Patient would like to know if he needs to schedule an ov.  Please follow up [phone]  Thank you [name]

## 2018-01-11 MED FILL — $LANTUS 100 UNITS/ML VIAL: 100 | 28 days supply | Qty: 10 | Fill #0

## 2018-01-28 ENCOUNTER — Ambulatory Visit (INDEPENDENT_AMBULATORY_CARE_PROVIDER_SITE_OTHER): Payer: Self-pay | Admitting: Physician Assistant

## 2018-01-28 ENCOUNTER — Encounter (INDEPENDENT_AMBULATORY_CARE_PROVIDER_SITE_OTHER): Payer: Self-pay | Admitting: Physician Assistant

## 2018-01-28 VITALS — BP 124/82 | HR 97 | Temp 98.1°F | Ht 67.0 in | Wt 138.2 lb

## 2018-01-28 DIAGNOSIS — Z23 Encounter for immunization: Secondary | ICD-10-CM

## 2018-01-28 DIAGNOSIS — E1069 Type 1 diabetes mellitus with other specified complication: Secondary | ICD-10-CM

## 2018-01-28 DIAGNOSIS — F418 Other specified anxiety disorders: Secondary | ICD-10-CM

## 2018-01-28 LAB — POCT GLYCOSYLATED HEMOGLOBIN (HGB A1C): HEMOGLOBIN A1C: 10.8 % — AB (ref 4.0–5.6)

## 2018-01-28 MED ORDER — INSULIN GLARGINE 100 UNIT/ML ~~LOC~~ SOLN: SUBCUTANEOUS | 0 refills | Status: DC

## 2018-01-28 MED ORDER — INSULIN ASPART 100 UNIT/ML ~~LOC~~ SOLN: 5.0000 [IU] | Freq: Three times a day (TID) | SUBCUTANEOUS | 2 refills | Status: DC

## 2018-01-28 MED ORDER — HYDROXYZINE HCL 25 MG PO TABS: 25.0000 mg | ORAL_TABLET | Freq: Every day | ORAL | 1 refills | Status: DC

## 2018-01-28 MED ORDER — "INSULIN SYRINGE 30G X 1/2"" 0.5 ML MISC": 5 refills | Status: DC

## 2018-01-28 MED ORDER — "INSULIN SYRINGE 30G X 1/2"" 0.5 ML MISC": 0 refills | Status: DC

## 2018-01-28 MED ORDER — BLOOD GLUCOSE MONITOR KIT: PACK | 0 refills | Status: DC

## 2018-01-28 MED ORDER — ESCITALOPRAM OXALATE 20 MG PO TABS: 20.0000 mg | ORAL_TABLET | Freq: Every day | ORAL | 2 refills | Status: DC

## 2018-01-28 MED FILL — hydrOXYzine HCL 25 MG TABS: 25 | 30 days supply | Qty: 30 | Fill #0

## 2018-01-28 MED FILL — TRUEplus LANCETS 28G MISC: 30 days supply | Qty: 100 | Fill #0

## 2018-01-28 MED FILL — $LANTUS 100 UNITS/ML VIAL: 100 | 28 days supply | Qty: 10 | Fill #0

## 2018-01-28 MED FILL — TRUEPLUS SYR 0.5ML 30GX5/16: 30G X 5/16" | 30 days supply | Qty: 100 | Fill #0

## 2018-01-28 MED FILL — TRUE METRIX TEST STRIP: 30 days supply | Qty: 100 | Fill #0

## 2018-01-28 MED FILL — ESCITALOPRAM 20 MG TABLET: 20 | 30 days supply | Qty: 30 | Fill #0

## 2018-01-28 MED FILL — $novoLOG 100 UNITS/ML VIAL: 100 | 28 days supply | Qty: 10 | Fill #0

## 2018-01-28 NOTE — Patient Instructions (Signed)
Diabetes Mellitus and Nutrition When you have diabetes (diabetes mellitus), it is very important to have healthy eating habits because your blood sugar (glucose) levels are greatly affected by what you eat and drink. Eating healthy foods in the appropriate amounts, at about the same times every day, can help you:  Control your blood glucose.  Lower your risk of heart disease.  Improve your blood pressure.  Reach or maintain a healthy weight.  Every person with diabetes is different, and each person has different needs for a meal plan. Your health care provider may recommend that you work with a diet and nutrition specialist (dietitian) to make a meal plan that is best for you. Your meal plan may vary depending on factors such as:  The calories you need.  The medicines you take.  Your weight.  Your blood glucose, blood pressure, and cholesterol levels.  Your activity level.  Other health conditions you have, such as heart or kidney disease.  How do carbohydrates affect me? Carbohydrates affect your blood glucose level more than any other type of food. Eating carbohydrates naturally increases the amount of glucose in your blood. Carbohydrate counting is a method for keeping track of how many carbohydrates you eat. Counting carbohydrates is important to keep your blood glucose at a healthy level, especially if you use insulin or take certain oral diabetes medicines. It is important to know how many carbohydrates you can safely have in each meal. This is different for every person. Your dietitian can help you calculate how many carbohydrates you should have at each meal and for snack. Foods that contain carbohydrates include:  Bread, cereal, rice, pasta, and crackers.  Potatoes and corn.  Peas, beans, and lentils.  Milk and yogurt.  Fruit and juice.  Desserts, such as cakes, cookies, ice cream, and candy.  How does alcohol affect me? Alcohol can cause a sudden decrease in blood  glucose (hypoglycemia), especially if you use insulin or take certain oral diabetes medicines. Hypoglycemia can be a life-threatening condition. Symptoms of hypoglycemia (sleepiness, dizziness, and confusion) are similar to symptoms of having too much alcohol. If your health care provider says that alcohol is safe for you, follow these guidelines:  Limit alcohol intake to no more than 1 drink per day for nonpregnant women and 2 drinks per day for men. One drink equals 12 oz of beer, 5 oz of wine, or 1 oz of hard liquor.  Do not drink on an empty stomach.  Keep yourself hydrated with water, diet soda, or unsweetened iced tea.  Keep in mind that regular soda, juice, and other mixers may contain a lot of sugar and must be counted as carbohydrates.  What are tips for following this plan? Reading food labels  Start by checking the serving size on the label. The amount of calories, carbohydrates, fats, and other nutrients listed on the label are based on one serving of the food. Many foods contain more than one serving per package.  Check the total grams (g) of carbohydrates in one serving. You can calculate the number of servings of carbohydrates in one serving by dividing the total carbohydrates by 15. For example, if a food has 30 g of total carbohydrates, it would be equal to 2 servings of carbohydrates.  Check the number of grams (g) of saturated and trans fats in one serving. Choose foods that have low or no amount of these fats.  Check the number of milligrams (mg) of sodium in one serving. Most people   should limit total sodium intake to less than 2,300 mg per day.  Always check the nutrition information of foods labeled as "low-fat" or "nonfat". These foods may be higher in added sugar or refined carbohydrates and should be avoided.  Talk to your dietitian to identify your daily goals for nutrients listed on the label. Shopping  Avoid buying canned, premade, or processed foods. These  foods tend to be high in fat, sodium, and added sugar.  Shop around the outside edge of the grocery store. This includes fresh fruits and vegetables, bulk grains, fresh meats, and fresh dairy. Cooking  Use low-heat cooking methods, such as baking, instead of high-heat cooking methods like deep frying.  Cook using healthy oils, such as olive, canola, or sunflower oil.  Avoid cooking with butter, cream, or high-fat meats. Meal planning  Eat meals and snacks regularly, preferably at the same times every day. Avoid going long periods of time without eating.  Eat foods high in fiber, such as fresh fruits, vegetables, beans, and whole grains. Talk to your dietitian about how many servings of carbohydrates you can eat at each meal.  Eat 4-6 ounces of lean protein each day, such as lean meat, chicken, fish, eggs, or tofu. 1 ounce is equal to 1 ounce of meat, chicken, or fish, 1 egg, or 1/4 cup of tofu.  Eat some foods each day that contain healthy fats, such as avocado, nuts, seeds, and fish. Lifestyle   Check your blood glucose regularly.  Exercise at least 30 minutes 5 or more days each week, or as told by your health care provider.  Take medicines as told by your health care provider.  Do not use any products that contain nicotine or tobacco, such as cigarettes and e-cigarettes. If you need help quitting, ask your health care provider.  Work with a counselor or diabetes educator to identify strategies to manage stress and any emotional and social challenges. What are some questions to ask my health care provider?  Do I need to meet with a diabetes educator?  Do I need to meet with a dietitian?  What number can I call if I have questions?  When are the best times to check my blood glucose? Where to find more information:  American Diabetes Association: diabetes.org/food-and-fitness/food  Academy of Nutrition and Dietetics:  www.eatright.org/resources/health/diseases-and-conditions/diabetes  National Institute of Diabetes and Digestive and Kidney Diseases (NIH): www.niddk.nih.gov/health-information/diabetes/overview/diet-eating-physical-activity Summary  A healthy meal plan will help you control your blood glucose and maintain a healthy lifestyle.  Working with a diet and nutrition specialist (dietitian) can help you make a meal plan that is best for you.  Keep in mind that carbohydrates and alcohol have immediate effects on your blood glucose levels. It is important to count carbohydrates and to use alcohol carefully. This information is not intended to replace advice given to you by your health care provider. Make sure you discuss any questions you have with your health care provider. Document Released: 12/29/2004 Document Revised: 05/08/2016 Document Reviewed: 05/08/2016 Elsevier Interactive Patient Education  2018 Elsevier Inc.  

## 2018-01-28 NOTE — Progress Notes (Signed)
Subjective:  Patient ID: [name], male    DOB: Sep 08, 1975  Age: 42 y.o. MRN: [id number]  CC: DM1  HPI Isaac Logginsis a 42 y.o.malewith a medical history of CKD, DM1, polysubstance abuse, and depression presents for f/u of DM1. Last A1c 11.9% eleven months ago. Was advised to return in six weeks and has now returned 9 months ago. Pt says he has been attending to several sick family members. Has been using insulin 75/25 acquired from his uncle and using Novolog sparingly. A1c 10.8% today. Generally does not feel well. Says his stress level is high and is having trouble sleeping. PHQ9 27 and PHQ9 21. Does not endorse suicidal ideation or intent. Does not endorse CP, palpitations, SOB, HA, tingling, numbness, abdominal pain, f/c/n/v, rash, swelling, or GI/GU sxs.      Outpatient Medications Prior to Visit  Medication Sig Dispense Refill  . blood glucose meter kit and supplies KIT Dispense based on patient and insurance preference. Use up to four times daily as directed. (FOR ICD-9 250.00, 250.01). (Patient not taking: Reported on 01/28/2018) 1 each 0  . escitalopram (LEXAPRO) 20 MG tablet Take 1 tablet (20 mg total) by mouth daily. (Patient not taking: Reported on 01/28/2018) 30 tablet 2  . ibuprofen (ADVIL,MOTRIN) 200 MG tablet Take 800 mg by mouth every 6 (six) hours as needed for mild pain or moderate pain.    Marland Kitchen insulin aspart (NOVOLOG) 100 UNIT/ML injection Inject 5 Units into the skin 3 (three) times daily with meals. (Patient not taking: Reported on 01/28/2018) 10 mL 2  . Insulin Syringe-Needle U-100 (INSULIN SYRINGE .5CC/30GX1/2") 30G X 1/2" 0.5 ML MISC Use 1-3 times with insulin (Patient not taking: Reported on 01/28/2018) 50 each 0  . LANTUS 100 UNIT/ML injection INJECT 25 UNITS INTO THE SKIN AT BEDTIME (Patient not taking: Reported on 01/28/2018) 10 mL 0  . clonazePAM (KLONOPIN) 0.5 MG tablet Take 1 tablet (0.5 mg total) by mouth at bedtime. 30 tablet 0  .  menthol-cetylpyridinium (CEPACOL) 3 MG lozenge Take 1 lozenge (3 mg total) by mouth as needed for sore throat. (Patient not taking: Reported on 05/02/2017) 100 tablet 0  . ondansetron (ZOFRAN) 4 MG tablet Take 1 tablet (4 mg total) by mouth every 8 (eight) hours as needed for nausea or vomiting. (Patient not taking: Reported on 05/02/2017) 20 tablet 0   No facility-administered medications prior to visit.      ROS Review of Systems  Constitutional: Negative for chills, fever and malaise/fatigue.  Eyes: Negative for blurred vision.  Respiratory: Negative for shortness of breath.   Cardiovascular: Negative for chest pain and palpitations.  Gastrointestinal: Negative for abdominal pain and nausea.  Genitourinary: Negative for dysuria and hematuria.  Musculoskeletal: Negative for joint pain and myalgias.  Skin: Negative for rash.  Neurological: Negative for tingling and headaches.  Psychiatric/Behavioral: Positive for depression. The patient is nervous/anxious.     Objective:  BP 124/82 (BP Location: Left Arm, Patient Position: Sitting, Cuff Size: Normal)   Pulse 97   Temp 98.1 F (36.7 C) (Oral)   Ht _0  (1.702 m)   Wt 138 lb 3.2 oz (62.7 kg)   SpO2 99%   BMI 21.65 kg/m   BP/Weight 01/28/2018 05/02/2017 40/12/8117  Systolic BP 147 829 562  Diastolic BP 82 91 76  Wt. (Lbs) 138.2 145.2 142  BMI 21.65 22.74 22.24      Physical Exam  Constitutional: He is oriented to person, place, and time.  Well developed, well nourished,  NAD, polite  HENT:  Head: Normocephalic and atraumatic.  Poor dentition  Eyes: No scleral icterus.  Neck: Normal range of motion. Neck supple. No thyromegaly present.  Cardiovascular: Normal rate, regular rhythm and normal heart sounds.  Pulmonary/Chest: Effort normal and breath sounds normal.  Abdominal: Soft. Bowel sounds are normal. There is no tenderness.  Musculoskeletal: He exhibits no edema.  Neurological: He is alert and oriented to person,  place, and time.  Skin: Skin is warm and dry. No rash noted. No erythema. No pallor.  Psychiatric: He has a normal mood and affect. His behavior is normal. Thought content normal.  Vitals reviewed.    Assessment & Plan:   1. Type 1 diabetes mellitus with other specified complication (HCC) - HgB A1c 10.8% - Comprehensive metabolic panel - Lipid panel - Refill insulin glargine (LANTUS) 100 UNIT/ML injection; INJECT 25 UNITS INTO THE SKIN AT BEDTIME  Dispense: 10 mL; Refill: 0. Pt advised to slowly uptitrate insulin dose to 50 units qhs if sugar is uncontrolled. He is to increase by one unit every other day.  - Refill insulin aspart (NOVOLOG) 100 UNIT/ML injection; Inject 5 Units into the skin 3 (three) times daily with meals.  Dispense: 10 mL; Refill: 2 - blood glucose meter kit and supplies KIT; Dispense based on patient and insurance preference. Use up to four times daily as directed. (FOR ICD-9 250.00, 250.01).  Dispense: 1 each; Refill: 0 - Refill Insulin Syringe-Needle U-100 (INSULIN SYRINGE .5CC/30GX1/2") 30G X 1/2" 0.5 ML MISC; Use 1-3 times with insulin  Dispense: 100 each; Refill: 5  2. Depression with anxiety - Refill escitalopram (LEXAPRO) 20 MG tablet; Take 1 tablet (20 mg total) by mouth daily.  Dispense: 30 tablet; Refill: 2 - Begin hydrOXYzine (ATARAX/VISTARIL) 25 MG tablet; Take 1 tablet (25 mg total) by mouth at bedtime.  Dispense: 30 tablet; Refill: 1 - Microalbumin / creatinine urine ratio     Meds ordered this encounter  Medications  . insulin glargine (LANTUS) 100 UNIT/ML injection    Sig: INJECT 25 UNITS INTO THE SKIN AT BEDTIME    Dispense:  10 mL    Refill:  0    Order Specific Question:   Supervising Provider    Answer:   Charlott Rakes [[id number]]  . DISCONTD: Insulin Syringe-Needle U-100 (INSULIN SYRINGE .5CC/30GX1/2") 30G X 1/2" 0.5 ML MISC    Sig: Use 1-3 times with insulin    Dispense:  50 each    Refill:  0    Order Specific Question:   Supervising  Provider    Answer:   Charlott Rakes [[id number]]  . insulin aspart (NOVOLOG) 100 UNIT/ML injection    Sig: Inject 5 Units into the skin 3 (three) times daily with meals.    Dispense:  10 mL    Refill:  2    Order Specific Question:   Supervising Provider    Answer:   Charlott Rakes [[id number]]  . escitalopram (LEXAPRO) 20 MG tablet    Sig: Take 1 tablet (20 mg total) by mouth daily.    Dispense:  30 tablet    Refill:  2    Order Specific Question:   Supervising Provider    Answer:   Charlott Rakes [[id number]]  . blood glucose meter kit and supplies KIT    Sig: Dispense based on patient and insurance preference. Use up to four times daily as directed. (FOR ICD-9 250.00, 250.01).    Dispense:  1 each    Refill:  0  Please dispense any glucometer brand available. Pt does not have any funds.    Order Specific Question:   Supervising Provider    Answer:   Charlott Rakes [phone]    Order Specific Question:   Number of strips    Answer:   100    Order Specific Question:   Number of lancets    Answer:   100  . hydrOXYzine (ATARAX/VISTARIL) 25 MG tablet    Sig: Take 1 tablet (25 mg total) by mouth at bedtime.    Dispense:  30 tablet    Refill:  1    Order Specific Question:   Supervising Provider    Answer:   Charlott Rakes [[id number]]  . Insulin Syringe-Needle U-100 (INSULIN SYRINGE .5CC/30GX1/2") 30G X 1/2" 0.5 ML MISC    Sig: Use 1-3 times with insulin    Dispense:  100 each    Refill:  5    Order Specific Question:   Supervising Provider    Answer:   Charlott Rakes [[id number]]    Follow-up: Return in about 3 months (around 04/30/2018) for Diabetes.   Clent Demark PA

## 2018-01-29 LAB — COMPREHENSIVE METABOLIC PANEL
A/G RATIO: 1.9 (ref 1.2–2.2)
ALBUMIN: 4.6 g/dL (ref 3.5–5.5)
ALT: 48 IU/L — AB (ref 0–44)
AST: 45 IU/L — ABNORMAL HIGH (ref 0–40)
Alkaline Phosphatase: 136 IU/L — ABNORMAL HIGH (ref 39–117)
BILIRUBIN TOTAL: 0.4 mg/dL (ref 0.0–1.2)
BUN / CREAT RATIO: 18 (ref 9–20)
BUN: 18 mg/dL (ref 6–24)
CHLORIDE: 96 mmol/L (ref 96–106)
CO2: 23 mmol/L (ref 20–29)
Calcium: 9.4 mg/dL (ref 8.7–10.2)
Creatinine, Ser: 0.98 mg/dL (ref 0.76–1.27)
GFR calc non Af Amer: 93 mL/min/{1.73_m2} (ref >59)
GFR, EST AFRICAN AMERICAN: 108 mL/min/{1.73_m2} (ref >59)
Globulin, Total: 2.4 g/dL (ref 1.5–4.5)
Glucose: 465 mg/dL — ABNORMAL HIGH (ref 65–99)
POTASSIUM: 5.1 mmol/L (ref 3.5–5.2)
Sodium: 137 mmol/L (ref 134–144)
TOTAL PROTEIN: 7 g/dL (ref 6.0–8.5)

## 2018-01-29 LAB — MICROALBUMIN / CREATININE URINE RATIO: CREATININE, UR: 39.9 mg/dL

## 2018-01-29 LAB — LIPID PANEL
Chol/HDL Ratio: 1.8 ratio (ref 0.0–5.0)
Cholesterol, Total: 191 mg/dL (ref 100–199)
HDL: 107 mg/dL
LDL Calculated: 69 mg/dL (ref 0–99)
Triglycerides: 73 mg/dL (ref 0–149)
VLDL Cholesterol Cal: 15 mg/dL (ref 5–40)

## 2018-01-29 MED FILL — !TRUE METRIX BLOOD GLUCOSE: 30 days supply | Qty: 1 | Fill #0

## 2018-01-30 ENCOUNTER — Telehealth (INDEPENDENT_AMBULATORY_CARE_PROVIDER_SITE_OTHER): Payer: Self-pay

## 2018-01-30 ENCOUNTER — Other Ambulatory Visit (INDEPENDENT_AMBULATORY_CARE_PROVIDER_SITE_OTHER): Payer: Self-pay | Admitting: Physician Assistant

## 2018-01-30 DIAGNOSIS — E1069 Type 1 diabetes mellitus with other specified complication: Secondary | ICD-10-CM

## 2018-01-30 MED ORDER — PRAVASTATIN SODIUM 20 MG PO TABS: 20.0000 mg | ORAL_TABLET | Freq: Every day | ORAL | 3 refills | Status: DC

## 2018-01-30 NOTE — Telephone Encounter (Signed)
Left message asking patient to return call to [facility] at [phone]. Jacqulynn Shappell S Lois Slagel, CMA  

## 2018-01-30 NOTE — Telephone Encounter (Signed)
-----   Message from Roger David Gomez, PA-C sent at 01/30/2018 12:36 PM EDT ----- Very mild liver inflammation. Cholesterol is ok but should take statin due to diabetic status. Will be retesting liver enzymes at f/u. 

## 2018-01-31 ENCOUNTER — Telehealth (INDEPENDENT_AMBULATORY_CARE_PROVIDER_SITE_OTHER): Payer: Self-pay

## 2018-01-31 ENCOUNTER — Encounter (INDEPENDENT_AMBULATORY_CARE_PROVIDER_SITE_OTHER): Payer: Self-pay

## 2018-01-31 NOTE — Telephone Encounter (Signed)
-----   Message from Loletta Specter, PA-C sent at 01/30/2018 12:36 PM EDT ----- Very mild liver inflammation. Cholesterol is ok but should take statin due to diabetic status. Will be retesting liver enzymes at f/u.

## 2018-01-31 NOTE — Telephone Encounter (Signed)
Called patient on number listed as home number but the voicemail is full. Called number listed as mobile left message asking patient to call [facility] at [phone]. Results mailed. Maryjean Morn, CMA

## 2018-02-26 MED FILL — $LANTUS 100 UNITS/ML VIAL: 100 | 28 days supply | Qty: 10 | Fill #0

## 2018-04-04 ENCOUNTER — Other Ambulatory Visit (INDEPENDENT_AMBULATORY_CARE_PROVIDER_SITE_OTHER): Payer: Self-pay | Admitting: Physician Assistant

## 2018-04-04 DIAGNOSIS — E1069 Type 1 diabetes mellitus with other specified complication: Secondary | ICD-10-CM

## 2018-04-04 NOTE — Telephone Encounter (Signed)
FWD to PCP. Tempestt S Roberts, CMA  

## 2018-04-15 MED FILL — $LANTUS 100 UNITS/ML VIAL: 100 | 40 days supply | Qty: 20 | Fill #0

## 2018-04-15 MED FILL — !NOVOLOG 100UNITS/ML VIAL: 100/ML | 28 days supply | Qty: 10 | Fill #1

## 2018-04-15 MED FILL — TRUEPLUS SYR 0.5ML 30GX5/16: 30G X 5/16" | 30 days supply | Qty: 100 | Fill #1

## 2018-04-30 ENCOUNTER — Ambulatory Visit (INDEPENDENT_AMBULATORY_CARE_PROVIDER_SITE_OTHER): Payer: Self-pay | Admitting: Family Medicine

## 2018-05-01 ENCOUNTER — Ambulatory Visit (INDEPENDENT_AMBULATORY_CARE_PROVIDER_SITE_OTHER): Payer: Self-pay | Admitting: Nurse Practitioner

## 2018-05-01 ENCOUNTER — Encounter (INDEPENDENT_AMBULATORY_CARE_PROVIDER_SITE_OTHER): Payer: Self-pay | Admitting: Nurse Practitioner

## 2018-05-01 ENCOUNTER — Other Ambulatory Visit: Payer: Self-pay

## 2018-05-01 VITALS — BP 134/87 | HR 87 | Temp 97.5°F | Ht 67.0 in | Wt 135.8 lb

## 2018-05-01 DIAGNOSIS — E1069 Type 1 diabetes mellitus with other specified complication: Secondary | ICD-10-CM

## 2018-05-01 DIAGNOSIS — Z794 Long term (current) use of insulin: Secondary | ICD-10-CM

## 2018-05-01 DIAGNOSIS — R748 Abnormal levels of other serum enzymes: Secondary | ICD-10-CM

## 2018-05-01 DIAGNOSIS — F418 Other specified anxiety disorders: Secondary | ICD-10-CM

## 2018-05-01 DIAGNOSIS — N649 Disorder of breast, unspecified: Secondary | ICD-10-CM

## 2018-05-01 DIAGNOSIS — M25512 Pain in left shoulder: Secondary | ICD-10-CM

## 2018-05-01 DIAGNOSIS — G8929 Other chronic pain: Secondary | ICD-10-CM

## 2018-05-01 LAB — POCT GLYCOSYLATED HEMOGLOBIN (HGB A1C): HEMOGLOBIN A1C: 11.8 % — AB (ref 4.0–5.6)

## 2018-05-01 MED ORDER — BUSPIRONE HCL 15 MG PO TABS: 15.0000 mg | ORAL_TABLET | Freq: Three times a day (TID) | ORAL | 0 refills | Status: AC

## 2018-05-01 MED ORDER — ESCITALOPRAM OXALATE 20 MG PO TABS: 20.0000 mg | ORAL_TABLET | Freq: Every day | ORAL | 2 refills | Status: DC

## 2018-05-01 MED ORDER — IBUPROFEN 800 MG PO TABS: 800.0000 mg | ORAL_TABLET | Freq: Three times a day (TID) | ORAL | 0 refills | Status: AC | PRN

## 2018-05-01 MED FILL — ESCITALOPRAM 20 MG TABLET: 20 | 30 days supply | Qty: 30 | Fill #0

## 2018-05-01 MED FILL — busPIRone HCL 15 MG TABS: 15 | 30 days supply | Qty: 90 | Fill #0

## 2018-05-01 MED FILL — IBUPROFEN 800 MG TABLET: 800 | 30 days supply | Qty: 90 | Fill #0

## 2018-05-01 NOTE — Patient Instructions (Signed)
Behavioral Health Resources:  ? ?What if I or someone I know is in crisis? ? ?If you are thinking about harming yourself or having thoughts of suicide, or if you know someone who is, seek help right away. ? ?Call your doctor or mental health care provider. ? ?Call 911 or go to a hospital emergency room to get immediate help, or ask a friend or family member to help you do these things. ? ?Call the [place] National Suicide Prevention Lifeline?s toll-free, 24-hour hotline at 1-800-273-TALK (1-800-273-8255) or TTY: 1-800-799-4 TTY (1-800-799-4889) to talk to a trained counselor. ? ?If you are in crisis, make sure you are not left alone.  ? ?If someone else is in crisis, make sure he or she is not left alone ? ? ?24 Hour Availability ? ?[facility]  ?[address], [place], [place] [address]  ?336-832-9700 or 1-800-711-2635 ? ?[facility] Crisis Line ?(Domestic Violence, Rape & Victim Assistance ?336-273-7273 ? ?[facility]  ?[address] [place], [place]  [address]               1-855-788-8787 or 336-676-6840 ? ?[facility] [place] [facility]    ?(ONLY from 8am-4pm)    ?336-899-1505 ? ?[facility] (24/7)   ?1-877-626-1772 ? ?[place] National Suicide Hotline   ?1-800-273-8255 (TALK) ? ?Support from local police to aid getting patient to hospital (http://www.[place].gov/index.aspx?page=2797) ? ? ?      ?ONGOING BEHAVIORAL HEALTH SUPPORT FOR UNINSURED and UNDERINSURED:  ?[facility]  ?336-676-6840  ?[address]  ?Walk-in first time, Monday-Friday, 8:30am-5:00pm  ?*Bring snack, drink, something to do, long wait at first visit, they do have pharmacy for behavioral health medications/ Bring own interpreter at first visit, if needed ?[facility]  ?336-387-6161  ?[address]  ?Walk-in Monday-Friday, 8:30am-12pm & 1-2:30pm  ?*pacientes que hablen espanol, favor comunicarse con el Sr[address]  [place], extension 2244 o la [address], extension 3331 para hacer una cita  ?[facility]:  ?336-429-5600 or [facility]ell[facility]@gmail.com  ?[address], [address]  ?Call or email, may self-refer  ?* uninsured/underinsured, 19-64yo, have both mental health and substance use challenges  ?[facility]:  ?Phone [phone]; Fax [phone]  ?*Call to schedule an appointment  ?3rd Floor located @?[address], [address]  ?Mon-Thursday: 8:30am-8:00pm Friday: 8:30am-7:00pm  ?* Be sure to park in a space labeled ?Psychology Department,? located to the right of the main door of the building. Enter the main doors facing the parking lot and take the elevator or stairs to the 3rd Floor.  ?[facility]:  ?336-832-9700 or  ?1-800-711-2635 (24/hour helpline)  ?[address]  ?Call to make appointment, tends to be a long wait to begin services, depending on insurance  ?[facility]  ?(336) 333-6860 ??  ?*Call to schedule an appointment  ?[address]  ?Monday-Friday, 8:00am-5:00pm  ?[facility]  ?([phone]  ?[address], [place]  ?Monday-Friday, walk-in 8am-3pm  ?First appointment is assessment, then will make appointment for psychiatry   ?  ?

## 2018-05-01 NOTE — Progress Notes (Signed)
[name] was seen today for follow-up.  Diagnoses and all orders for this visit:  Type 1 diabetes mellitus with other specified complication (HCC) -     HgB A1c -     CMP14+EGFR Continue blood sugar control as discussed in office today, low carbohydrate diet, and regular physical exercise as tolerated, 150 minutes per week (30 min each day, 5 days per week, or 50 min 3 days per week). Keep blood sugar logs with fasting goal of 90-130 mg/dl, post prandial (after you eat) less than 180.  For Hypoglycemia: BS <60 and Hyperglycemia BS >400; contact the clinic ASAP. Annual eye exams and foot exams are recommended.  Diabetes is poorly controlled. Advised patient to keep a fasting blood sugar log fast, 2 hours post lunch and bedtime which will be reviewed at the next office visit.  Depression with anxiety -     escitalopram (LEXAPRO) 20 MG tablet; Take 1 tablet (20 mg total) by mouth daily. -     busPIRone (BUSPAR) 15 MG tablet; Take 1 tablet (15 mg total) by mouth 3 (three) times daily for 30 days.  Chronic left shoulder pain -     ibuprofen (ADVIL,MOTRIN) 800 MG tablet; Take 1 tablet (800 mg total) by mouth every 8 (eight) hours as needed for up to 30 days. Work on losing weight to help reduce joint pain. May alternate with heat and ice application for pain relief. May also alternate with acetaminophen as prescribed pain relief. Other alternatives include massage, acupuncture and water aerobics.  You must stay active and avoid a sedentary lifestyle.  Elevated liver enzymes -     CMP14+EGFR  Nipple lesion 1cm -     US BREAST COMPLETE UNI RIGHT INC AXILLA; Future Chronic (6 months). Painful. Denies injury or trauma. Reports there was a mass in the right nipple last year that resolved on its own.    Patient has been counseled on age-appropriate routine health concerns for screening and prevention. These are reviewed and up-to-date. Referrals have been placed accordingly. Immunizations are  up-to-date or declined.    Subjective:   Chief Complaint  Patient presents with  . Follow-up    DM   HPI [name] 43 y.o. male presents to office today for follow up to DM. He has a history of CKD, DM1,  depression, anxiety and polysubstance abuse. Today he endorses increased depression. He found out his mother has dementia, he dad is suffering from cancer and he states his son and sister died over the past 2 years.  Patient has been advised to apply for financial assistance and schedule to see our financial counselor.    DM  Chronic. Poorly controlled. Current medications include Novolog 5 mg TID and Lantus 50 units at bedtime. Today he reports he has not been taking his Novolog  because he does not eat 3 times per day. He has not been monitoring his blood glucose levels at home. Blood glucose 361 today. He is not diet compliant. He does endorse hypoglycemic symptoms when he goes too long without eating. I have encouraged him to try an eat healthy snacks if he is missing meals. He is overdue for eye exam. He has a bag of fireball cinnamon sugar candy with him today here in the office. Will not increase novolog if he is not eating 3 times per day.  Patient has been advised to apply for financial assistance and schedule to see our financial counselor.  Lab Results  Component Value Date  HGBA1C 11.8 (A) 05/01/2018   Lab Results  Component Value Date   HGBA1C 10.8 (A) 01/28/2018   Depression Endorses medication compliance taking Lexapro 20 mg daily. States hydroxyzine  25 mg nightly does not help to reduce his anxiety. He denies any current thoughts of self harm.  Depression screen [facility] 2/9 05/01/2018 01/28/2018 05/02/2017 03/22/2017  Decreased Interest 3 3 3 3   Down, Depressed, Hopeless 3 3 3 3   PHQ - 2 Score 6 6 6 6   Altered sleeping 3 3 3 3   Tired, decreased energy 3 3 3 3   Change in appetite 3 3 3 3   Feeling bad or failure about yourself  3 3 3 3   Trouble concentrating 3 3 3 3    Moving slowly or fidgety/restless 3 3 3 3   Suicidal thoughts 3 3 3 3   PHQ-9 Score 27 27 27 27     GAD 7 : Generalized Anxiety Score 05/01/2018 01/28/2018 05/02/2017 03/22/2017  Nervous, Anxious, on Edge 3 3 3 3   Control/stop worrying 3 3 3 3   Worry too much - different things 3 3 3 3   Trouble relaxing 3 3 3 3   Restless 3 3 3 3   Easily annoyed or irritable 3 3 3 3   Afraid - awful might happen 3 3 3 3   Total GAD 7 Score 21 21 21 21    Shoulder Pain He has chronic shoulder pain. Pain has been ongoing since last year. He believes he injured his shoulder at work last year lifting heavy boxes. He can not lift his arm past his shoulder. Pain is worse with lifting, bending, pulling. Relieving factors: None. He has taken ibuprofen 200-468m with no relief of symptoms. Pain is described as sharp, stabbing.     Review of Systems  Constitutional: Negative for fever, malaise/fatigue and weight loss.  HENT: Negative.  Negative for nosebleeds.   Eyes: Negative.  Negative for blurred vision, double vision and photophobia.  Respiratory: Negative.  Negative for cough and shortness of breath.   Cardiovascular: Negative.  Negative for chest pain, palpitations and leg swelling.  Gastrointestinal: Negative.  Negative for heartburn, nausea and vomiting.  Musculoskeletal: Positive for joint pain and myalgias.       SEE HPI  Skin:       mass  Neurological: Negative.  Negative for dizziness, focal weakness, seizures and headaches.  Psychiatric/Behavioral: Positive for depression. Negative for suicidal ideas. The patient is nervous/anxious and has insomnia.     Past Medical History:  Diagnosis Date  . CKD (chronic kidney disease), stage II   . [place] complication [place]    type 1  . Polysubstance abuse ([facility]     Past Surgical History:  Procedure Laterality Date  . DENTAL SURGERY      Family History  Problem Relation Age of Onset  . Arthritis Mother   . Diabetes Mellitus II  Father     Social History Reviewed with no changes to be made today.   Outpatient Medications Prior to Visit  Medication Sig Dispense Refill  . blood glucose meter kit and supplies KIT Dispense based on patient and insurance preference. Use up to four times daily as directed. (FOR ICD-9 250.00, 250.01). 1 each 0  . insulin aspart (NOVOLOG) 100 UNIT/ML injection Inject 5 Units into the skin 3 (three) times daily with meals. 10 mL 2  . insulin glargine (LANTUS) 100 UNIT/ML injection INJECT 50 UNITS INTO THE SKIN AT BEDTIME 20 mL 2  . Insulin Syringe-Needle U-100 (INSULIN SYRINGE .5CC/30GX1/2")  30G X 1/2" 0.5 ML MISC Use 1-3 times with insulin 100 each 5  . escitalopram (LEXAPRO) 20 MG tablet Take 1 tablet (20 mg total) by mouth daily. 30 tablet 2  . hydrOXYzine (ATARAX/VISTARIL) 25 MG tablet Take 1 tablet (25 mg total) by mouth at bedtime. 30 tablet 1  . pravastatin (PRAVACHOL) 20 MG tablet Take 1 tablet (20 mg total) by mouth daily. (Patient not taking: Reported on 05/01/2018) 90 tablet 3   No facility-administered medications prior to visit.     Allergies  Allergen Reactions  . Shellfish Allergy Anaphylaxis       Objective:    BP 134/87 (BP Location: Right Arm, Patient Position: Sitting, Cuff Size: Normal)   Pulse 87   Temp (!) 97.5 F (36.4 C) (Oral)   Ht 5' 7"  (1.702 m)   Wt 135 lb 12.8 oz (61.6 kg)   SpO2 100%   BMI 21.27 kg/m  Wt Readings from Last 3 Encounters:  05/01/18 135 lb 12.8 oz (61.6 kg)  01/28/18 138 lb 3.2 oz (62.7 kg)  05/02/17 145 lb 3.2 oz (65.9 kg)    Physical Exam Vitals signs and nursing note reviewed.  Constitutional:      Appearance: He is well-developed.  HENT:     Head: Normocephalic and atraumatic.  Neck:     Musculoskeletal: Normal range of motion.  Cardiovascular:     Rate and Rhythm: Normal rate and regular rhythm.     Heart sounds: Normal heart sounds. No murmur. No friction rub. No gallop.   Pulmonary:     Effort: Pulmonary effort is  normal. No tachypnea or respiratory distress.     Breath sounds: Normal breath sounds. No decreased breath sounds, wheezing, rhonchi or rales.  Chest:     Chest wall: No tenderness.     Breasts:        Right: Mass present. No nipple discharge, skin change or tenderness.        Left: No mass, nipple discharge, skin change or tenderness.    Abdominal:     General: Bowel sounds are normal.     Palpations: Abdomen is soft.  Musculoskeletal:     Left shoulder: He exhibits decreased range of motion, tenderness, swelling and pain.  Skin:    General: Skin is warm and dry.  Neurological:     Mental Status: He is alert and oriented to person, place, and time.     Coordination: Coordination normal.  Psychiatric:        Behavior: Behavior normal. Behavior is cooperative.        Thought Content: Thought content normal.        Judgment: Judgment normal.        Patient has been counseled extensively about nutrition and exercise as well as the importance of adherence with medications and regular follow-up. The patient was given clear instructions to go to ER or return to medical center if symptoms don't improve, worsen or new problems develop. The patient verbalized understanding.   Follow-up: Return in about 3 months (around 07/31/2018).   Gildardo Pounds, FNP-BC [facility] [place], [place]   05/01/2018, 11:36 AM

## 2018-05-02 LAB — CMP14+EGFR
ALK PHOS: 112 IU/L (ref 39–117)
ALT: 26 IU/L (ref 0–44)
AST: 22 IU/L (ref 0–40)
Albumin/Globulin Ratio: 1.9 (ref 1.2–2.2)
Albumin: 4.4 g/dL (ref 3.5–5.5)
BUN/Creatinine Ratio: 15 (ref 9–20)
BUN: 13 mg/dL (ref 6–24)
Bilirubin Total: 0.3 mg/dL (ref 0.0–1.2)
CO2: 23 mmol/L (ref 20–29)
CREATININE: 0.85 mg/dL (ref 0.76–1.27)
Calcium: 9.1 mg/dL (ref 8.7–10.2)
Chloride: 101 mmol/L (ref 96–106)
GFR calc Af Amer: 123 mL/min/{1.73_m2} (ref >59)
GFR calc non Af Amer: 106 mL/min/{1.73_m2} (ref >59)
GLOBULIN, TOTAL: 2.3 g/dL (ref 1.5–4.5)
GLUCOSE: 359 mg/dL — AB (ref 65–99)
Potassium: 4.7 mmol/L (ref 3.5–5.2)
Sodium: 138 mmol/L (ref 134–144)
Total Protein: 6.7 g/dL (ref 6.0–8.5)

## 2018-05-03 ENCOUNTER — Telehealth (INDEPENDENT_AMBULATORY_CARE_PROVIDER_SITE_OTHER): Payer: Self-pay

## 2018-05-03 NOTE — Telephone Encounter (Signed)
Patient returned call he is aware that liver and kidney are normal at this time. Advised patient to work on controlling diabetes. Maryjean Morn, CMA

## 2018-05-03 NOTE — Telephone Encounter (Signed)
-----   Message from Claiborne Rigg, NP sent at 05/02/2018  8:42 PM EST ----- Liver function is normal at this time as well as kidney function. Please work on controlling your diabetes

## 2018-05-03 NOTE — Telephone Encounter (Signed)
-----   Message from Zelda W Fleming, NP sent at 05/02/2018  8:42 PM EST ----- Liver function is normal at this time as well as kidney function. Please work on controlling your diabetes 

## 2018-05-14 ENCOUNTER — Other Ambulatory Visit (INDEPENDENT_AMBULATORY_CARE_PROVIDER_SITE_OTHER): Payer: Self-pay | Admitting: Nurse Practitioner

## 2018-05-14 DIAGNOSIS — N649 Disorder of breast, unspecified: Secondary | ICD-10-CM

## 2018-05-27 MED FILL — $LANTUS 100 UNITS/ML VIAL: 100 | 40 days supply | Qty: 20 | Fill #1

## 2018-05-27 MED FILL — !NOVOLOG 100UNITS/ML VIAL: 100/ML | 28 days supply | Qty: 10 | Fill #2

## 2018-06-24 ENCOUNTER — Other Ambulatory Visit (INDEPENDENT_AMBULATORY_CARE_PROVIDER_SITE_OTHER): Payer: Self-pay | Admitting: Nurse Practitioner

## 2018-06-24 DIAGNOSIS — M25512 Pain in left shoulder: Principal | ICD-10-CM

## 2018-06-24 DIAGNOSIS — G8929 Other chronic pain: Secondary | ICD-10-CM

## 2018-06-24 NOTE — Telephone Encounter (Signed)
FWD to PCP. Tempestt S Roberts, CMA  

## 2018-06-25 NOTE — Telephone Encounter (Signed)
Interaction with Lexapro PRN use only

## 2018-07-02 MED FILL — !LANTUS 100 UNITS/ML VIAL: 100 | 40 days supply | Qty: 20 | Fill #2

## 2018-07-30 ENCOUNTER — Telehealth: Payer: Self-pay | Admitting: *Deleted

## 2018-07-30 NOTE — Telephone Encounter (Signed)
MA unable to reach patient due to home number ringing busy and the mobile number no answer and VM is full. Please confirm patients appointment for tomorrow and complete prescreen for in office appointment.

## 2018-07-31 ENCOUNTER — Ambulatory Visit: Payer: Self-pay | Attending: Primary Care | Admitting: Primary Care

## 2018-07-31 ENCOUNTER — Ambulatory Visit (INDEPENDENT_AMBULATORY_CARE_PROVIDER_SITE_OTHER): Payer: Self-pay | Admitting: Primary Care

## 2018-07-31 ENCOUNTER — Other Ambulatory Visit: Payer: Self-pay

## 2018-07-31 ENCOUNTER — Encounter: Payer: Self-pay | Admitting: Primary Care

## 2018-07-31 DIAGNOSIS — E1069 Type 1 diabetes mellitus with other specified complication: Secondary | ICD-10-CM

## 2018-07-31 DIAGNOSIS — M25512 Pain in left shoulder: Secondary | ICD-10-CM

## 2018-07-31 DIAGNOSIS — F418 Other specified anxiety disorders: Secondary | ICD-10-CM

## 2018-07-31 DIAGNOSIS — I10 Essential (primary) hypertension: Secondary | ICD-10-CM

## 2018-07-31 DIAGNOSIS — G8929 Other chronic pain: Secondary | ICD-10-CM

## 2018-07-31 DIAGNOSIS — E782 Mixed hyperlipidemia: Secondary | ICD-10-CM

## 2018-07-31 DIAGNOSIS — Z720 Tobacco use: Secondary | ICD-10-CM

## 2018-07-31 DIAGNOSIS — M25511 Pain in right shoulder: Secondary | ICD-10-CM

## 2018-07-31 MED ORDER — ESCITALOPRAM OXALATE 20 MG PO TABS: 20.0000 mg | ORAL_TABLET | Freq: Every day | ORAL | 0 refills | Status: DC

## 2018-07-31 NOTE — Progress Notes (Signed)
Med refill  Dental Pain pain and would like referral   Referral to Behavioral Health (per patient was taking mental health medications in the past but it's been a long time ago) per pt in the last month he felt like he was going to end his life but he don't feel like that now. Per pt he was feeling like this 1 mo ago and his friend helped him to calm.   Stopped drinking about a couple years ago but occasionally he will have a couple of beer.

## 2018-07-31 NOTE — Progress Notes (Signed)
Established Patient Office Visit  Subjective:  Patient ID: [name], male    DOB: 05/07/1975  Age: 43 y.o. MRN: [id number]  CC:  Chief Complaint  Patient presents with  . Referral  . Dental Pain  . Medication Refill    HPI [name] presents for referrals to the dentist and mental health feels as though life isn't worth living and out of medication. PMH: CKD, DM1,  depression, anxiety and polysubstance abuse.  Past Medical History:  Diagnosis Date  . CKD (chronic kidney disease), stage II   . Diabetes mellitus without complication ([place])    type 1  . Polysubstance abuse [facility])     Past Surgical History:  Procedure Laterality Date  . DENTAL SURGERY      Family History  Problem Relation Age of Onset  . Arthritis Mother   . Diabetes Mellitus II Father     Social History   Socioeconomic History  . Marital status: Single    Spouse name: Not on file  . Number of children: Not on file  . Years of education: Not on file  . Highest education level: Not on file  Occupational History  . Not on file  Social Needs  . Financial resource strain: Not on file  . Food insecurity:    Worry: Not on file    Inability: Not on file  . Transportation needs:    Medical: Not on file    Non-medical: Not on file  Tobacco Use  . Smoking status: Current Every Day Smoker    Packs/day: 0.50    Years: 20.00    Pack years: 10.00    Types: Cigarettes  . Smokeless tobacco: Never Used  Substance and Sexual Activity  . Alcohol use: Yes    Alcohol/week: 40.0 standard drinks    Types: 40 Cans of beer per week  . Drug use: Yes    Frequency: 5.0 times per week    Types: Marijuana, Cocaine  . Sexual activity: Not on file  Lifestyle  . Physical activity:    Days per week: Not on file    Minutes per session: Not on file  . Stress: Not on file  Relationships  . Social connections:    Talks on phone: Not on file    Gets together: Not on file    Attends religious service:  Not on file    Active member of club or organization: Not on file    Attends meetings of clubs or organizations: Not on file    Relationship status: Not on file  . Intimate partner violence:    Fear of current or ex partner: Not on file    Emotionally abused: Not on file    Physically abused: Not on file    Forced sexual activity: Not on file  Other Topics Concern  . Not on file  Social History Narrative  . Not on file    Outpatient Medications Prior to Visit  Medication Sig Dispense Refill  . blood glucose meter kit and supplies KIT Dispense based on patient and insurance preference. Use up to four times daily as directed. (FOR ICD-9 250.00, 250.01). 1 each 0  . escitalopram (LEXAPRO) 20 MG tablet Take 1 tablet (20 mg total) by mouth daily. 90 tablet 2  . ibuprofen (ADVIL,MOTRIN) 200 MG tablet Take 200 mg by mouth every 6 (six) hours as needed.    . insulin aspart (NOVOLOG) 100 UNIT/ML injection Inject 5 Units into the skin 3 (three) times daily  with meals. 10 mL 2  . insulin glargine (LANTUS) 100 UNIT/ML injection INJECT 50 UNITS INTO THE SKIN AT BEDTIME 20 mL 2  . Insulin Syringe-Needle U-100 (INSULIN SYRINGE .5CC/30GX1/2") 30G X 1/2" 0.5 ML MISC Use 1-3 times with insulin 100 each 5  . pravastatin (PRAVACHOL) 20 MG tablet Take 1 tablet (20 mg total) by mouth daily. 90 tablet 3   No facility-administered medications prior to visit.     Allergies  Allergen Reactions  . Shellfish Allergy Anaphylaxis    ROS Review of Systems  Constitutional: Negative.   HENT: Negative.   Eyes: Negative.   Respiratory: Negative.   Cardiovascular: Negative.   Gastrointestinal: Negative.   Endocrine: Negative.   Genitourinary: Negative.   Musculoskeletal: Negative.   Skin: Negative.   Neurological: Negative.   Hematological: Negative.   Psychiatric/Behavioral: Positive for agitation, behavioral problems and suicidal ideas.       No plan life is effortless       Objective:     Physical Exam  There were no vitals taken for this visit. Wt Readings from Last 3 Encounters:  05/01/18 135 lb 12.8 oz (61.6 kg)  01/28/18 138 lb 3.2 oz (62.7 kg)  05/02/17 145 lb 3.2 oz (65.9 kg)     Health Maintenance Due  Topic Date Due  . FOOT EXAM  06/09/1983  . OPHTHALMOLOGY EXAM  06/09/1983    There are no preventive care reminders to display for this patient.  No results found for: TSH Lab Results  Component Value Date   WBC 13.7 (H) 03/14/2017   HGB 11.3 (L) 03/14/2017   HCT 33.1 (L) 03/14/2017   MCV 90.9 03/14/2017   PLT 227 03/14/2017   Lab Results  Component Value Date   NA 138 05/01/2018   K 4.7 05/01/2018   CO2 23 05/01/2018   GLUCOSE 359 (H) 05/01/2018   BUN 13 05/01/2018   CREATININE 0.85 05/01/2018   BILITOT 0.3 05/01/2018   ALKPHOS 112 05/01/2018   AST 22 05/01/2018   ALT 26 05/01/2018   PROT 6.7 05/01/2018   ALBUMIN 4.4 05/01/2018   CALCIUM 9.1 05/01/2018   ANIONGAP 7 03/13/2017   Lab Results  Component Value Date   CHOL 191 01/28/2018   Lab Results  Component Value Date   HDL 107 01/28/2018   Lab Results  Component Value Date   LDLCALC 69 01/28/2018   Lab Results  Component Value Date   TRIG 73 01/28/2018   Lab Results  Component Value Date   CHOLHDL 1.8 01/28/2018   Lab Results  Component Value Date   HGBA1C 11.8 (A) 05/01/2018      Assessment & Plan:   Problem List Items Addressed This Visit    None    Wood was seen today for referral, dental pain and medication refill.  Diagnoses and all orders for this visit:  Tobacco abuse Each visit d/w cessation at this time not interested in quitting   Depression with anxiety -     escitalopram (LEXAPRO) 20 MG tablet; Take 1 tablet (20 mg total) by mouth daily.  Type 1 diabetes mellitus with other specified complication (HCC) -     POCT HgB A1C -     CBC with Differential/Platelet; Future  Chronic pain of both shoulders OTC ibuprofen or tylenol will prescribe  Voltaren gel out of pocket would be expensive  Essential hypertension Low dose of ACE after labs reviewed  -     Comprehensive metabolic panel; Future  Mixed hyperlipidemia  Prescribed pravastatin 34m -     Lipid panel    No orders of the defined types were placed in this encounter.   Follow-up: No follow-ups on file.    MKerin Perna NP

## 2018-09-03 MED ORDER — LISINOPRIL 5 MG PO TABS: 5.0000 mg | ORAL_TABLET | Freq: Every day | ORAL | 3 refills | Status: DC

## 2018-09-03 MED ORDER — PRAVASTATIN SODIUM 20 MG PO TABS: 20.0000 mg | ORAL_TABLET | Freq: Every day | ORAL | 3 refills | Status: DC

## 2018-09-03 MED FILL — LISINOPRIL 5 MG TAB: 5 | 30 days supply | Qty: 30 | Fill #0

## 2018-09-03 MED FILL — PRAVASTATIN SODIUM 20 MG TA: 20 | 30 days supply | Qty: 30 | Fill #0

## 2018-11-08 ENCOUNTER — Telehealth (INDEPENDENT_AMBULATORY_CARE_PROVIDER_SITE_OTHER): Payer: Self-pay

## 2018-11-08 NOTE — Telephone Encounter (Signed)
FWD to PCP. Tempestt S Roberts, CMA  

## 2018-11-08 NOTE — Telephone Encounter (Signed)
Patient called to request medication refill for  insulin glargine (LANTUS) 100 UNIT/ML injection    insulin aspart (NOVOLOG) 100 UNIT/ML injection     Patient uses [facility]   Patient last had an appointment on 07-31-18 with NP Edwards  Please advice [phone]  Thank you Whitney Post

## 2018-11-10 ENCOUNTER — Other Ambulatory Visit (INDEPENDENT_AMBULATORY_CARE_PROVIDER_SITE_OTHER): Payer: Self-pay | Admitting: Primary Care

## 2018-11-10 DIAGNOSIS — E1069 Type 1 diabetes mellitus with other specified complication: Secondary | ICD-10-CM

## 2018-11-10 MED ORDER — INSULIN ASPART 100 UNIT/ML ~~LOC~~ SOLN: 5.0000 [IU] | Freq: Three times a day (TID) | SUBCUTANEOUS | 2 refills | Status: DC

## 2018-11-10 MED ORDER — INSULIN GLARGINE 100 UNIT/ML ~~LOC~~ SOLN: SUBCUTANEOUS | 2 refills | Status: DC

## 2018-11-10 MED ORDER — "INSULIN SYRINGE 30G X 1/2"" 0.5 ML MISC": 5 refills | Status: DC

## 2018-11-11 MED FILL — !LANTUS 100 UNITS/ML VIAL: 100 | 40 days supply | Qty: 20 | Fill #0

## 2018-11-11 MED FILL — TRUEPLUS SYR 0.5ML 30GX5/16: 30G X 5/16" | 30 days supply | Qty: 100 | Fill #0

## 2018-11-11 MED FILL — !NOVOLOG 100UNITS/ML VIAL: 100/ML | 28 days supply | Qty: 10 | Fill #0

## 2019-01-07 MED FILL — !LANTUS 100 UNITS/ML VIAL: 100 | 40 days supply | Qty: 20 | Fill #1

## 2019-01-07 MED FILL — !NOVOLOG 100UNITS/ML VIAL: 100/ML | 28 days supply | Qty: 10 | Fill #1

## 2019-02-10 MED FILL — !LANTUS 100 UNITS/ML VIAL: 100 | 40 days supply | Qty: 20 | Fill #2

## 2019-02-10 MED FILL — !NOVOLOG 100UNITS/ML VIAL: 100/ML | 28 days supply | Qty: 10 | Fill #2

## 2019-03-10 ENCOUNTER — Ambulatory Visit (INDEPENDENT_AMBULATORY_CARE_PROVIDER_SITE_OTHER): Payer: Self-pay | Admitting: Primary Care

## 2019-03-10 ENCOUNTER — Other Ambulatory Visit: Payer: Self-pay

## 2019-03-10 VITALS — BP 143/88 | HR 109 | Temp 97.3°F | Ht 67.0 in | Wt 130.2 lb

## 2019-03-10 DIAGNOSIS — Z794 Long term (current) use of insulin: Secondary | ICD-10-CM

## 2019-03-10 DIAGNOSIS — E1069 Type 1 diabetes mellitus with other specified complication: Secondary | ICD-10-CM

## 2019-03-10 DIAGNOSIS — E1122 Type 2 diabetes mellitus with diabetic chronic kidney disease: Secondary | ICD-10-CM

## 2019-03-10 DIAGNOSIS — N182 Chronic kidney disease, stage 2 (mild): Secondary | ICD-10-CM

## 2019-03-10 LAB — POCT GLYCOSYLATED HEMOGLOBIN (HGB A1C): Hemoglobin A1C: 10.7 % — AB (ref 4.0–5.6)

## 2019-03-10 LAB — GLUCOSE, POCT (MANUAL RESULT ENTRY): POC Glucose: 558 mg/dl — AB (ref 70–99)

## 2019-03-10 NOTE — Progress Notes (Signed)
Established Patient Office Visit  Subjective:  Patient ID: [name], male    DOB: Oct 16, 1975  Age: 43 y.o. MRN: [id number]  CC:  Chief Complaint  Patient presents with  . Follow-up    DM  . Pain    HPI [name] presents for originally for dental and shoulder pain. He is a diabetic A1C 10.7 and CBG 558.   Past Medical History:  Diagnosis Date  . CKD (chronic kidney disease), stage II   . Diabetes mellitus without complication ([place])    type 1  . Polysubstance abuse [facility])     Past Surgical History:  Procedure Laterality Date  . DENTAL SURGERY      Family History  Problem Relation Age of Onset  . Arthritis Mother   . Diabetes Mellitus II Father     Social History   Socioeconomic History  . Marital status: Single    Spouse name: Not on file  . Number of children: Not on file  . Years of education: Not on file  . Highest education level: Not on file  Occupational History  . Not on file  Social Needs  . Financial resource strain: Not on file  . Food insecurity    Worry: Not on file    Inability: Not on file  . Transportation needs    Medical: Not on file    Non-medical: Not on file  Tobacco Use  . Smoking status: Current Every Day Smoker    Packs/day: 0.50    Years: 20.00    Pack years: 10.00    Types: Cigarettes  . Smokeless tobacco: Never Used  Substance and Sexual Activity  . Alcohol use: Yes    Alcohol/week: 40.0 standard drinks    Types: 40 Cans of beer per week  . Drug use: Yes    Frequency: 5.0 times per week    Types: Marijuana, Cocaine  . Sexual activity: Not on file  Lifestyle  . Physical activity    Days per week: Not on file    Minutes per session: Not on file  . Stress: Not on file  Relationships  . Social Herbalist on phone: Not on file    Gets together: Not on file    Attends religious service: Not on file    Active member of club or organization: Not on file    Attends meetings of clubs or  organizations: Not on file    Relationship status: Not on file  . Intimate partner violence    Fear of current or ex partner: Not on file    Emotionally abused: Not on file    Physically abused: Not on file    Forced sexual activity: Not on file  Other Topics Concern  . Not on file  Social History Narrative  . Not on file    Outpatient Medications Prior to Visit  Medication Sig Dispense Refill  . blood glucose meter kit and supplies KIT Dispense based on patient and insurance preference. Use up to four times daily as directed. (FOR ICD-9 250.00, 250.01). (Patient not taking: Reported on 03/10/2019) 1 each 0  . escitalopram (LEXAPRO) 20 MG tablet Take 1 tablet (20 mg total) by mouth daily. 90 tablet 0  . insulin aspart (NOVOLOG) 100 UNIT/ML injection Inject 5 Units into the skin 3 (three) times daily with meals. (Patient not taking: Reported on 03/10/2019) 10 mL 2  . insulin glargine (LANTUS) 100 UNIT/ML injection INJECT 50 UNITS INTO THE SKIN AT  BEDTIME (Patient not taking: Reported on 03/10/2019) 20 mL 2  . Insulin Syringe-Needle U-100 (INSULIN SYRINGE .5CC/30GX1/2") 30G X 1/2" 0.5 ML MISC Use 1-3 times with insulin (Patient not taking: Reported on 03/10/2019) 100 each 5  . lisinopril (ZESTRIL) 5 MG tablet Take 1 tablet (5 mg total) by mouth daily. (Patient not taking: Reported on 03/10/2019) 90 tablet 3  . pravastatin (PRAVACHOL) 20 MG tablet Take 1 tablet (20 mg total) by mouth daily. (Patient not taking: Reported on 03/10/2019) 30 tablet 3   No facility-administered medications prior to visit.     Allergies  Allergen Reactions  . Shellfish Allergy Anaphylaxis    ROS Review of Systems  HENT: Positive for dental problem.   Endocrine: Positive for polydipsia, polyphagia and polyuria.  Musculoskeletal: Positive for neck pain.       Shoulder pain   All other systems reviewed and are negative.     Objective:    Physical Exam  Constitutional: He is oriented to person, place,  and time. He appears well-developed and well-nourished.  HENT:  Head: Normocephalic.  Neck: Normal range of motion.  Cardiovascular: Normal rate and regular rhythm.  Pulmonary/Chest: Effort normal and breath sounds normal.  Abdominal: Soft. Bowel sounds are normal.  Musculoskeletal: Normal range of motion.  Neurological: He is oriented to person, place, and time.    BP (!) 143/88 (BP Location: Right Arm, Patient Position: Sitting, Cuff Size: Normal)   Pulse (!) 109   Temp (!) 97.3 F (36.3 C) (Temporal)   Ht _0  (1.702 m)   Wt 130 lb 3.2 oz (59.1 kg)   SpO2 98%   BMI 20.39 kg/m  Wt Readings from Last 3 Encounters:  03/10/19 130 lb 3.2 oz (59.1 kg)  05/01/18 135 lb 12.8 oz (61.6 kg)  01/28/18 138 lb 3.2 oz (62.7 kg)     Health Maintenance Due  Topic Date Due  . FOOT EXAM  06/09/1983  . OPHTHALMOLOGY EXAM  06/09/1983  . HEMOGLOBIN A1C  10/30/2018  . INFLUENZA VACCINE  11/16/2018    There are no preventive care reminders to display for this patient.  No results found for: TSH Lab Results  Component Value Date   WBC 13.7 (H) 03/14/2017   HGB 11.3 (L) 03/14/2017   HCT 33.1 (L) 03/14/2017   MCV 90.9 03/14/2017   PLT 227 03/14/2017   Lab Results  Component Value Date   NA 138 05/01/2018   K 4.7 05/01/2018   CO2 23 05/01/2018   GLUCOSE 359 (H) 05/01/2018   BUN 13 05/01/2018   CREATININE 0.85 05/01/2018   BILITOT 0.3 05/01/2018   ALKPHOS 112 05/01/2018   AST 22 05/01/2018   ALT 26 05/01/2018   PROT 6.7 05/01/2018   ALBUMIN 4.4 05/01/2018   CALCIUM 9.1 05/01/2018   ANIONGAP 7 03/13/2017   Lab Results  Component Value Date   CHOL 191 01/28/2018   Lab Results  Component Value Date   HDL 107 01/28/2018   Lab Results  Component Value Date   LDLCALC 69 01/28/2018   Lab Results  Component Value Date   TRIG 73 01/28/2018   Lab Results  Component Value Date   CHOLHDL 1.8 01/28/2018   Lab Results  Component Value Date   HGBA1C 10.7 (A) 03/10/2019       Assessment & Plan:  [name] was seen today for follow-up and pain.  Diagnoses and all orders for this visit: [name] was seen today for follow-up and pain.  Diagnoses and all  orders for this visit: -  Type 2 diabetes mellitus with stage 2 chronic kidney disease, with long-term current use of insulin (HCC) Unable to treat patient A1C 10.7 and CBG 558 will need fluids and administration of insulin. Patient did not want to be transported by EMS signed a AMA and stated he will go today or tomorrow.  -     HgB A1c -     Glucose (CBG)     No orders of the defined types were placed in this encounter.   Follow-up: No follow-ups on file.    Kerin Perna, NP

## 2019-03-12 LAB — MICROALBUMIN, URINE: Microalbumin, Urine: <3 ug/mL

## 2019-03-20 ENCOUNTER — Encounter (INDEPENDENT_AMBULATORY_CARE_PROVIDER_SITE_OTHER): Payer: Self-pay

## 2019-04-07 ENCOUNTER — Other Ambulatory Visit (INDEPENDENT_AMBULATORY_CARE_PROVIDER_SITE_OTHER): Payer: Self-pay | Admitting: Primary Care

## 2019-04-07 DIAGNOSIS — E1069 Type 1 diabetes mellitus with other specified complication: Secondary | ICD-10-CM

## 2019-04-07 NOTE — Telephone Encounter (Signed)
FWD to PCP

## 2019-04-08 ENCOUNTER — Other Ambulatory Visit (INDEPENDENT_AMBULATORY_CARE_PROVIDER_SITE_OTHER): Payer: Self-pay | Admitting: Primary Care

## 2019-04-08 DIAGNOSIS — E1069 Type 1 diabetes mellitus with other specified complication: Secondary | ICD-10-CM

## 2019-04-08 MED FILL — !NOVOLOG 100UNITS/ML VIAL: 100/ML | 28 days supply | Qty: 10 | Fill #0

## 2019-04-09 MED FILL — !LANTUS 100 UNITS/ML VIAL: 100 | 40 days supply | Qty: 20 | Fill #0

## 2019-05-09 MED FILL — !LANTUS 100 UNITS/ML VIAL: 100 | 40 days supply | Qty: 20 | Fill #1

## 2019-06-09 ENCOUNTER — Ambulatory Visit (INDEPENDENT_AMBULATORY_CARE_PROVIDER_SITE_OTHER): Payer: Self-pay | Admitting: Primary Care

## 2019-06-19 MED FILL — !NOVOLOG 100UNITS/ML VIAL: 100/ML | 28 days supply | Qty: 10 | Fill #1

## 2019-06-19 MED FILL — !LANTUS 100 UNITS/ML VIAL: 100 | 40 days supply | Qty: 20 | Fill #2

## 2019-07-17 ENCOUNTER — Other Ambulatory Visit (INDEPENDENT_AMBULATORY_CARE_PROVIDER_SITE_OTHER): Payer: Self-pay | Admitting: Primary Care

## 2019-07-17 ENCOUNTER — Telehealth (INDEPENDENT_AMBULATORY_CARE_PROVIDER_SITE_OTHER): Payer: Self-pay

## 2019-07-17 DIAGNOSIS — E1069 Type 1 diabetes mellitus with other specified complication: Secondary | ICD-10-CM

## 2019-07-17 MED FILL — NovoLOG 100 UNIT/ML SOLN: 100 | 28 days supply | Qty: 10 | Fill #2

## 2019-07-17 NOTE — Telephone Encounter (Signed)
Needs appoint to future refills

## 2019-07-17 NOTE — Telephone Encounter (Signed)
-----   Message from Grayce Sessions, NP sent at 07/17/2019  2:16 PM EDT ----- Needs appointment last A1C 69months ago >10. No more refills until seen

## 2019-07-17 NOTE — Telephone Encounter (Signed)
Called both numbers listed for patient and both ring busy. Maryjean Morn, CMA

## 2019-07-17 NOTE — Telephone Encounter (Signed)
Sent to PCP ?

## 2019-07-24 MED FILL — LANTUS 100 UNITS/ML VIAL: 100 | 20 days supply | Qty: 10 | Fill #0

## 2019-09-08 MED FILL — LANTUS 100 UNITS/ML VIAL: 100 | 20 days supply | Qty: 10 | Fill #1

## 2019-10-15 ENCOUNTER — Other Ambulatory Visit (INDEPENDENT_AMBULATORY_CARE_PROVIDER_SITE_OTHER): Payer: Self-pay | Admitting: Primary Care

## 2019-10-15 DIAGNOSIS — E1069 Type 1 diabetes mellitus with other specified complication: Secondary | ICD-10-CM

## 2019-10-15 NOTE — Telephone Encounter (Signed)
Requested medication (s) are due for refill today:  yes  Requested medication (s) are on the active medication list:  yes  Future visit scheduled:  no  Last Refill: Lantus Ins.; 07/17/19; 20 ml.; refill 0                   Novolog Ins.; 04/08/19; 10 ml.; refill x 2  Note to clinic:  attempted to call pt. To schedule a f/u appt. (last appt. 03/10/19)  Tried both mobile and work # and got a busy signal at both numbers.   Requested Prescriptions  Pending Prescriptions Disp Refills   NOVOLOG 100 UNIT/ML injection [Pharmacy Med Name: NovoLOG 100 UNIT/ML SOLN 100 Solution] 10 mL 2    Sig: INJECT 5 UNITS INTO THE SKIN 3 (THREE) TIMES DAILY WITH MEALS.      Endocrinology:  Diabetes - Insulins Failed - 10/15/2019 10:21 AM      Failed - HBA1C is between 0 and 7.9 and within 180 days    Hemoglobin A1C  Date Value Ref Range Status  03/10/2019 10.7 (A) 4.0 - 5.6 % Final   Hgb A1c MFr Bld  Date Value Ref Range Status  03/13/2017 11.9 (H) 4.8 - 5.6 % Final    Comment:    (NOTE) Pre diabetes:          5.7%-6.4% Diabetes:              >6.4% Glycemic control for   <7.0% adults with diabetes           Failed - Valid encounter within last 6 months    Recent Outpatient Visits           7 months ago Type 1 diabetes mellitus with other specified complication (HCC)   [facility] Grayce Sessions, NP   1 year ago Tobacco abuse   [facility] [place], Kinnie Scales, NP   1 year ago Type 1 diabetes mellitus with other specified complication (HCC)   [facility] Claiborne Rigg, NP   1 year ago Type 1 diabetes mellitus with other specified complication (HCC)   [facility] Loletta Specter, PA-C   2 years ago Depression with anxiety   [facility] Loletta Specter, PA-C                LANTUS 100 UNIT/ML injection [Pharmacy Med Name: LANTUS 100 UNITS/ML VIAL 100  Solution] 10 mL 0    Sig: INJECT 50 UNITS INTO THE SKIN AT BEDTIME      Endocrinology:  Diabetes - Insulins Failed - 10/15/2019 10:21 AM      Failed - HBA1C is between 0 and 7.9 and within 180 days    Hemoglobin A1C  Date Value Ref Range Status  03/10/2019 10.7 (A) 4.0 - 5.6 % Final   Hgb A1c MFr Bld  Date Value Ref Range Status  03/13/2017 11.9 (H) 4.8 - 5.6 % Final    Comment:    (NOTE) Pre diabetes:          5.7%-6.4% Diabetes:              >6.4% Glycemic control for   <7.0% adults with diabetes           Failed - Valid encounter within last 6 months    Recent Outpatient Visits           7 months ago Type  1 diabetes mellitus with other specified complication (HCC)   [facility] Grayce Sessions, NP   1 year ago Tobacco abuse   [facility] [address] [facility] [place], Kinnie Scales, NP   1 year ago Type 1 diabetes mellitus with other specified complication [facility])   [facility] Claiborne Rigg, NP   1 year ago Type 1 diabetes mellitus with other specified complication [facility])   [facility] Loletta Specter, PA-C   2 years ago Depression with anxiety   [facility] Loletta Specter, PA-C

## 2020-11-23 ENCOUNTER — Emergency Department (HOSPITAL_COMMUNITY)
Admission: EM | Admit: 2020-11-23 | Discharge: 2020-11-23 | Disposition: A | Discharge status: Home / Self Care | Payer: Self-pay | Attending: Emergency Medicine | Admitting: Emergency Medicine

## 2020-11-23 ENCOUNTER — Other Ambulatory Visit: Payer: Self-pay

## 2020-11-23 ENCOUNTER — Encounter (HOSPITAL_COMMUNITY): Payer: Self-pay

## 2020-11-23 DIAGNOSIS — R739 Hyperglycemia, unspecified: Secondary | ICD-10-CM

## 2020-11-23 DIAGNOSIS — E1065 Type 1 diabetes mellitus with hyperglycemia: Secondary | ICD-10-CM | POA: Insufficient documentation

## 2020-11-23 DIAGNOSIS — N182 Chronic kidney disease, stage 2 (mild): Secondary | ICD-10-CM | POA: Insufficient documentation

## 2020-11-23 DIAGNOSIS — E1022 Type 1 diabetes mellitus with diabetic chronic kidney disease: Secondary | ICD-10-CM | POA: Insufficient documentation

## 2020-11-23 DIAGNOSIS — F17210 Nicotine dependence, cigarettes, uncomplicated: Secondary | ICD-10-CM | POA: Insufficient documentation

## 2020-11-23 DIAGNOSIS — Z794 Long term (current) use of insulin: Secondary | ICD-10-CM | POA: Insufficient documentation

## 2020-11-23 DIAGNOSIS — E1069 Type 1 diabetes mellitus with other specified complication: Secondary | ICD-10-CM

## 2020-11-23 LAB — CBC WITH DIFFERENTIAL/PLATELET
Abs Immature Granulocytes: 0.02 10*3/uL (ref 0.00–0.07)
Basophils Absolute: 0 10*3/uL (ref 0.0–0.1)
Basophils Relative: 0 %
Eosinophils Absolute: 0.1 10*3/uL (ref 0.0–0.5)
Eosinophils Relative: 1 %
HCT: 35.9 % — ABNORMAL LOW (ref 39.0–52.0)
Hemoglobin: 12.2 g/dL — ABNORMAL LOW (ref 13.0–17.0)
Immature Granulocytes: 0 %
Lymphocytes Relative: 22 %
Lymphs Abs: 1.8 10*3/uL (ref 0.7–4.0)
MCH: 31.1 pg (ref 26.0–34.0)
MCHC: 34 g/dL (ref 30.0–36.0)
MCV: 91.6 fL (ref 80.0–100.0)
Monocytes Absolute: 0.5 10*3/uL (ref 0.1–1.0)
Monocytes Relative: 6 %
Neutro Abs: 6 10*3/uL (ref 1.7–7.7)
Neutrophils Relative %: 71 %
Platelets: 267 10*3/uL (ref 150–400)
RBC: 3.92 MIL/uL — ABNORMAL LOW (ref 4.22–5.81)
RDW: 13.6 % (ref 11.5–15.5)
WBC: 8.4 10*3/uL (ref 4.0–10.5)
nRBC: 0 % (ref 0.0–0.2)

## 2020-11-23 LAB — BLOOD GAS, VENOUS
Acid-Base Excess: 3.1 mmol/L — ABNORMAL HIGH (ref 0.0–2.0)
Bicarbonate: 27.7 mmol/L (ref 20.0–28.0)
O2 Saturation: 85.1 %
Patient temperature: 98.6
pCO2, Ven: 44.3 mmHg (ref 44.0–60.0)
pH, Ven: 7.412 (ref 7.250–7.430)
pO2, Ven: 56.2 mmHg — ABNORMAL HIGH (ref 32.0–45.0)

## 2020-11-23 LAB — BASIC METABOLIC PANEL
Anion gap: 9 (ref 5–15)
BUN: 30 mg/dL — ABNORMAL HIGH (ref 6–20)
CO2: 25 mmol/L (ref 22–32)
Calcium: 8.3 mg/dL — ABNORMAL LOW (ref 8.9–10.3)
Chloride: 99 mmol/L (ref 98–111)
Creatinine, Ser: 1.1 mg/dL (ref 0.61–1.24)
GFR, Estimated: >60 mL/min (ref >60)
Glucose, Bld: 418 mg/dL — ABNORMAL HIGH (ref 70–99)
Potassium: 4.6 mmol/L (ref 3.5–5.1)
Sodium: 133 mmol/L — ABNORMAL LOW (ref 135–145)

## 2020-11-23 LAB — HEMOGLOBIN A1C
Hgb A1c MFr Bld: 12.4 % — ABNORMAL HIGH (ref 4.8–5.6)
Mean Plasma Glucose: 309.18 mg/dL

## 2020-11-23 LAB — URINALYSIS, ROUTINE W REFLEX MICROSCOPIC
Bacteria, UA: NONE SEEN
Bilirubin Urine: NEGATIVE
Glucose, UA: >=500 mg/dL — AB
Hgb urine dipstick: NEGATIVE
Ketones, ur: NEGATIVE mg/dL
Leukocytes,Ua: NEGATIVE
Nitrite: NEGATIVE
Protein, ur: NEGATIVE mg/dL
Specific Gravity, Urine: 1.029 (ref 1.005–1.030)
pH: 6 (ref 5.0–8.0)

## 2020-11-23 LAB — CBG MONITORING, ED
Glucose-Capillary: 406 mg/dL — ABNORMAL HIGH (ref 70–99)
Glucose-Capillary: 314 mg/dL — ABNORMAL HIGH (ref 70–99)

## 2020-11-23 LAB — LIPASE, BLOOD: Lipase: 33 U/L (ref 11–51)

## 2020-11-23 MED ORDER — INSULIN ASPART 100 UNIT/ML IJ SOLN
4.0000 [IU] | Freq: Once | INTRAMUSCULAR | Status: AC
  Administered 2020-11-23: 4 [IU] via SUBCUTANEOUS
  Filled 2020-11-23: qty 0.04

## 2020-11-23 MED ORDER — INSULIN GLARGINE 100 UNIT/ML ~~LOC~~ SOLN
15.0000 [IU] | Freq: Every day | SUBCUTANEOUS | 0 refills | Status: DC
  Filled 2020-11-23: qty 10, 28d supply, fill #0

## 2020-11-23 MED ORDER — TRUE METRIX BLOOD GLUCOSE TEST VI STRP
ORAL_STRIP | 12 refills | Status: DC
  Filled 2020-11-23: qty 100, 25d supply, fill #0
  Filled 2020-12-22: qty 100, 25d supply, fill #1
  Filled 2021-01-26 – 2021-01-27 (×3): qty 100, 25d supply, fill #2
  Filled 2021-04-01 – 2021-04-15 (×2): qty 100, 25d supply, fill #3
  Filled 2021-06-03: qty 100, 25d supply, fill #0
  Filled 2021-07-01: qty 100, 25d supply, fill #1
  Filled 2021-08-09: qty 100, 25d supply, fill #2
  Filled 2021-09-07 – 2021-09-26 (×2): qty 100, 25d supply, fill #3
  Filled 2021-10-21: qty 100, 25d supply, fill #4

## 2020-11-23 MED ORDER — INPEN 100-BLUE-NOVOLOG-FIASP DEVI
5.0000 [IU] | Freq: Three times a day (TID) | 0 refills | Status: DC
  Filled 2020-11-23: qty 1, fill #0

## 2020-11-23 MED ORDER — SODIUM CHLORIDE 0.9 % IV BOLUS
1000.0000 mL | Freq: Once | INTRAVENOUS | Status: AC
  Administered 2020-11-23: 1000 mL via INTRAVENOUS

## 2020-11-23 MED ORDER — TRUE METRIX METER W/DEVICE KIT
PACK | 0 refills | Status: DC
Start: 2020-11-23 — End: 2021-08-09
  Filled 2020-11-23: qty 1, 365d supply, fill #0

## 2020-11-23 MED ORDER — INSULIN ASPART 100 UNIT/ML IJ SOLN
5.0000 [IU] | Freq: Three times a day (TID) | INTRAMUSCULAR | 0 refills | Status: DC
Start: 2020-11-23 — End: 2021-01-24
  Filled 2020-11-23: qty 10, 67d supply, fill #0
  Filled 2020-12-08: qty 10, 28d supply, fill #0

## 2020-11-23 MED ORDER — "PEN NEEDLES 3/16"" 31G X 5 MM MISC"
1.0000 | Freq: Every day | 0 refills | Status: DC
  Filled 2020-11-23: qty 60, fill #0

## 2020-11-23 MED ORDER — "PEN NEEDLES 3/16"" 31G X 5 MM MISC"
1.0000 | Freq: Every day | 0 refills | Status: DC
  Filled 2020-11-23: qty 100, 100d supply, fill #0

## 2020-11-23 MED ORDER — "INSULIN SYRINGE-NEEDLE U-100 31G X 5/16"" 0.3 ML MISC"
1.0000 | 0 refills | Status: DC | PRN
  Filled 2020-11-23: qty 100, 25d supply, fill #0

## 2020-11-23 MED ORDER — TRUEPLUS LANCETS 28G MISC
1.0000 | 0 refills | Status: DC | PRN
  Filled 2020-11-23: qty 100, 25d supply, fill #0

## 2020-11-23 NOTE — ED Notes (Signed)
Diabetes coordinator at the bedside.  °

## 2020-11-23 NOTE — ED Provider Notes (Signed)
[facility] DEPT Provider Note   CSN: [id number] Arrival date & time: 11/23/20  0515     History Chief Complaint  Patient presents with   Hyperglycemia    [name] is a 45 y.o. male with a past medical history of CKD, diabetes presenting to the ED with a chief complaint of hyperglycemia.  States that a few months ago he ran out of his Lantus and was not able to get it refilled.  He had leftover NovoLog from when he used to take it and began taking this instead.  He does not check his blood sugars at home.  He has been feeling gradually worse with generalized abdominal pain, vomiting, myalgias.  He was told to get checked out from work today.  He states that symptoms have not improved.  Denies any cough, fever.  He does report intermittent diarrhea.  No sick contacts with similar symptoms.  HPI     Past Medical History:  Diagnosis Date   CKD (chronic kidney disease), stage II    Diabetes mellitus without complication ([place])    type 1   Polysubstance abuse ([place])     Patient Active Problem List   Diagnosis Date Noted   Tobacco abuse 03/13/2017   Acute renal failure superimposed on stage 2 chronic kidney disease ([place]) 03/13/2017   Hyperkalemia 03/13/2017   SIRS (systemic inflammatory response syndrome) ([place]) 03/13/2017   Type 1 diabetes mellitus with other specified complication ([place]) 38/01/1750   Neuropathy 12/28/2013   Substance abuse ([place]) 12/28/2013   Alcohol dependence ([place]) 12/28/2013   Abdominal pain, epigastric 12/28/2013   Protein-calorie malnutrition, severe ([place]) 12/28/2013   DKA (diabetic ketoacidoses) 12/27/2013    Past Surgical History:  Procedure Laterality Date   DENTAL SURGERY         Family History  Problem Relation Age of Onset   Arthritis Mother    Diabetes Mellitus II Father     Social History   Tobacco Use   Smoking status: Every Day    Packs/day: 0.50    Years: 20.00    Pack years: 10.00    Types:  Cigarettes   Smokeless tobacco: Never  Vaping Use   Vaping Use: Never used  Substance Use Topics   Alcohol use: Yes    Alcohol/week: 40.0 standard drinks    Types: 40 Cans of beer per week   Drug use: Yes    Frequency: 5.0 times per week    Types: Marijuana, Cocaine    Home Medications Prior to Admission medications   Medication Sig Start Date End Date Taking? Authorizing Provider  injection device for insulin (INPEN 100-BLUE-NOVOLOG-FIASP) DEVI 5 Units by Other route with breakfast, with lunch, and with evening meal. 11/23/20  Yes Gabbriella Presswood, PA-C  insulin aspart (NOVOLOG) 100 UNIT/ML injection Inject 5 Units into the skin 3 (three) times daily with meals. 04/08/19  Yes Kerin Perna, NP  blood glucose meter kit and supplies KIT Dispense based on patient and insurance preference. Use up to four times daily as directed. (FOR ICD-9 250.00, 250.01). Patient not taking: No sig reported 01/28/18   Clent Demark, PA-C  escitalopram (LEXAPRO) 20 MG tablet Take 1 tablet (20 mg total) by mouth daily. Patient not taking: Reported on 11/23/2020 07/31/18 10/29/18  Kerin Perna, NP  insulin glargine (LANTUS) 100 UNIT/ML injection Inject 0.15 mLs (15 Units total) into the skin at bedtime. 11/23/20   Katerine Morua, PA-C  Insulin Syringe-Needle U-100 (INSULIN SYRINGE .5CC/30GX1/2") 30G X  1/2" 0.5 ML MISC Use 1-3 times with insulin Patient not taking: No sig reported 11/10/18   Kerin Perna, NP  lisinopril (ZESTRIL) 5 MG tablet Take 1 tablet (5 mg total) by mouth daily. Patient not taking: No sig reported 09/03/18   Kerin Perna, NP  pravastatin (PRAVACHOL) 20 MG tablet Take 1 tablet (20 mg total) by mouth daily. Patient not taking: No sig reported 09/03/18   Kerin Perna, NP    Allergies    Shellfish allergy  Review of Systems   Review of Systems  Constitutional:  Positive for fatigue. Negative for appetite change, chills and fever.  HENT:  Negative for ear pain,  rhinorrhea, sneezing and sore throat.   Eyes:  Negative for photophobia and visual disturbance.  Respiratory:  Negative for cough, chest tightness, shortness of breath and wheezing.   Cardiovascular:  Negative for chest pain and palpitations.  Gastrointestinal:  Positive for abdominal pain, diarrhea and vomiting. Negative for blood in stool, constipation and nausea.  Genitourinary:  Negative for dysuria, hematuria and urgency.  Musculoskeletal:  Positive for myalgias.  Skin:  Negative for rash.  Neurological:  Positive for dizziness. Negative for weakness and light-headedness.   Physical Exam Updated Vital Signs BP 117/79   Pulse 85   Temp 98 F (36.7 C) (Oral)   Resp 16   Ht _0  (1.727 m)   Wt 63.5 kg   SpO2 99%   BMI 21.29 kg/m   Physical Exam Vitals and nursing note reviewed.  Constitutional:      General: He is not in acute distress.    Appearance: He is well-developed.  HENT:     Head: Normocephalic and atraumatic.     Nose: Nose normal.  Eyes:     General: No scleral icterus.       Right eye: No discharge.        Left eye: No discharge.     Conjunctiva/sclera: Conjunctivae normal.  Cardiovascular:     Rate and Rhythm: Normal rate and regular rhythm.     Heart sounds: Normal heart sounds. No murmur heard.   No friction rub. No gallop.  Pulmonary:     Effort: Pulmonary effort is normal. No respiratory distress.     Breath sounds: Normal breath sounds.  Abdominal:     General: Bowel sounds are normal. There is no distension.     Palpations: Abdomen is soft.     Tenderness: There is abdominal tenderness (Generalized). There is no guarding.  Musculoskeletal:        General: Normal range of motion.     Cervical back: Normal range of motion and neck supple.  Skin:    General: Skin is warm and dry.     Findings: No rash.  Neurological:     Mental Status: He is alert and oriented to person, place, and time.     Cranial Nerves: No cranial nerve deficit.      Motor: No abnormal muscle tone.     Coordination: Coordination normal.    ED Results / Procedures / Treatments   Labs (all labs ordered are listed, but only abnormal results are displayed) Labs Reviewed  CBC WITH DIFFERENTIAL/PLATELET - Abnormal; Notable for the following components:      Result Value   RBC 3.92 (*)    Hemoglobin 12.2 (*)    HCT 35.9 (*)    All other components within normal limits  URINALYSIS, ROUTINE W REFLEX MICROSCOPIC - Abnormal; Notable for the following  components:   Color, Urine STRAW (*)    Glucose, UA >=500 (*)    All other components within normal limits  BASIC METABOLIC PANEL - Abnormal; Notable for the following components:   Sodium 133 (*)    Glucose, Bld 418 (*)    BUN 30 (*)    Calcium 8.3 (*)    All other components within normal limits  BLOOD GAS, VENOUS - Abnormal; Notable for the following components:   pO2, Ven 56.2 (*)    Acid-Base Excess 3.1 (*)    All other components within normal limits  HEMOGLOBIN A1C - Abnormal; Notable for the following components:   Hgb A1c MFr Bld 12.4 (*)    All other components within normal limits  CBG MONITORING, ED - Abnormal; Notable for the following components:   Glucose-Capillary 406 (*)    All other components within normal limits  CBG MONITORING, ED - Abnormal; Notable for the following components:   Glucose-Capillary 314 (*)    All other components within normal limits  LIPASE, BLOOD    EKG None  Radiology No results found.  Procedures Procedures   Medications Ordered in ED Medications  sodium chloride 0.9 % bolus 1,000 mL (1,000 mLs Intravenous New Bag/Given 11/23/20 0737)  insulin aspart (novoLOG) injection 4 Units (4 Units Subcutaneous Given 11/23/20 4098)    ED Course  I have reviewed the triage vital signs and the nursing notes.  Pertinent labs & imaging results that were available during my care of the patient were reviewed by me and considered in my medical decision making (see  chart for details).  Clinical Course as of 11/23/20 1218  Tue Nov 23, 2020  0813 pH, Ven: 7.412 [HK]  0813 CO2: 25 [HK]  0840 Glucose-Capillary(!): 314 [HK]  [id number] I have consulted the diabetes coordinator who will see the patient at the bedside and I have also placed a TOC consult to arrange for PCP follow-up. [HK]    Clinical Course User Index [HK] Delia Heady, PA-C   MDM Rules/Calculators/A&P                           45 year old male with past medical history of diabetes, CKD presenting to the ED with a chief complaint of hypoglycemia.  Ran out of his Lantus several months ago and has instead been using his NovoLog that was leftover.  He has not been checking his blood sugars at home.  Reports dizziness, abdominal pain, vomiting, fatigue that has been persistent for the past few months since he switched to NovoLog.  Symptoms got worse over the past few days.  On exam abdomen is generally tender without rebound or guarding.  His vital signs within normal limits.  Blood sugar in triage was 406.  Will check other lab work and give IV fluids and reassess.  Work-up significant for pH of 7.4.  Normal bicarb.  No evidence of DKA.  Given IV fluids and small dose of insulin with improvement in his blood sugar to the 300s.  He was evaluated by the diabetes coordinator and transition of care team at the bedside to help with medication management.  We have represcribed his Lantus and NovoLog appropriately at the advice of the diabetes coordinator.  Attempt to get him in with a PCP for follow-up.  He remains hemodynamically stable and in no acute distress.  Urged importance of adhering to his diabetes management regimen and following up.  Patient is agreeable to  the plan.  Return precautions given.   Patient is hemodynamically stable, in NAD, and able to ambulate in the ED. Evaluation does not show pathology that would require ongoing emergent intervention or inpatient treatment. I explained the  diagnosis to the patient. Pain has been managed and has no complaints prior to discharge. Patient is comfortable with above plan and is stable for discharge at this time. All questions were answered prior to disposition. Strict return precautions for returning to the ED were discussed. Encouraged follow up with PCP.   An After Visit Summary was printed and given to the patient.   Portions of this note were generated with [employer]. Dictation errors may occur despite best attempts at proofreading.  Final Clinical Impression(s) / ED Diagnoses Final diagnoses:  Hyperglycemia  Type 1 diabetes mellitus with other specified complication ([place])    Rx / DC Orders ED Discharge Orders          Ordered    insulin glargine (LANTUS) 100 UNIT/ML injection  Daily at bedtime        11/23/20 1216    injection device for insulin (INPEN 100-BLUE-NOVOLOG-FIASP) DEVI  3 times daily with meals        11/23/20 [address], PA-C 11/23/20 1220    Milton Ferguson, MD 11/28/20 [phone]

## 2020-11-23 NOTE — Discharge Instructions (Addendum)
Take the medications as prescribed. Make sure you are checking your blood sugars. Follow-up with the primary care provider listed below. Return to the ER if you start to experience worsening symptoms, chest pain, shortness of breath or uncontrollable vomiting.

## 2020-11-23 NOTE — ED Notes (Signed)
Lab notified and aware of HgbA1C.

## 2020-11-23 NOTE — Progress Notes (Addendum)
Inpatient Diabetes Program Recommendations  AACE/ADA: New Consensus Statement on Inpatient Glycemic Control (2015)  Target Ranges:  Prepandial:   less than 140 mg/dL      Peak postprandial:   less than 180 mg/dL (1-2 hours)      Critically ill patients:  140 - 180 mg/dL   Lab Results  Component Value Date   GLUCAP 314 (H) 11/23/2020   HGBA1C 10.7 (A) 03/10/2019    Review of Glycemic Control  Diabetes history: DM1 Outpatient Diabetes medications: Novolog 5 units TID, ran out of Lantus 2 months ago Current orders for Inpatient glycemic control: Received Novolog 8 units thus far today  HgbA1C - pending. PCP - Previously saw MD at [facility]  Inpatient Diabetes Program Recommendations:    Lantus 15 units Q24H Add meal coverage - Novolog 3 units TID with meals  Will see pt in ED to discuss importance of monitoring blood sugars, taking insulin as prescribed and f/u with PCP.  Awaiting HgbA1C results.  Thank you. Ailene Ards, RD, LDN, CDE Inpatient Diabetes Coordinator [phone]   Addendum: HgbA1C 12.4%.  Spoke with pt at bedside regarding his diabetes control at home. Discussed HgbA1C of 12.4% - average blood sugar of 309 mg/dL. Pt states he hasn't been to PCP in over 2 years. Has not had Lantus in over 2 months. A friend gave him 70/30, but he said it was old. Just started working 2 months ago at [employer]. [place] he feels bad most of the time. Needs meter/strips and pen needles. DIscussed complications from uncontrolled blood sugars. Pt states he just has not had money to get insulin prescriptions. Tries to eat healthly. Always carries hard candy with him in case of hypos. Answered questions. To make appt at [facility]. Will need $6 for copay of Lantus and Novolog . Pt states he will have to find someone to lend it to him. He promised to get insulin today.   Discussed above with PA and RN.   Thank you. Ailene Ards, RD, LDN, CDE Inpatient Diabetes  Coordinator [phone]

## 2020-11-23 NOTE — Progress Notes (Addendum)
Match letter for medication assistance given to patient. Stct-primary care on [place] -no answer will wait and call back for pcp and fu appointment.

## 2020-11-23 NOTE — ED Triage Notes (Signed)
Pt states that he thinks his blood sugar is elevated due to him not feeling well. Pt reports not checking his blood sugar at home or taking the correct medication.

## 2020-11-30 ENCOUNTER — Other Ambulatory Visit: Payer: Self-pay

## 2020-12-03 ENCOUNTER — Emergency Department (HOSPITAL_COMMUNITY): Payer: Self-pay

## 2020-12-03 ENCOUNTER — Inpatient Hospital Stay (HOSPITAL_COMMUNITY): Payer: Self-pay

## 2020-12-03 ENCOUNTER — Encounter (HOSPITAL_COMMUNITY): Payer: Self-pay

## 2020-12-03 ENCOUNTER — Other Ambulatory Visit: Payer: Self-pay

## 2020-12-03 ENCOUNTER — Inpatient Hospital Stay (HOSPITAL_COMMUNITY)
Admission: EM | Admit: 2020-12-03 | Discharge: 2020-12-07 | DRG: 917 | Disposition: A | Discharge status: Home / Self Care | Payer: Self-pay | Attending: Internal Medicine | Admitting: Internal Medicine

## 2020-12-03 DIAGNOSIS — L259 Unspecified contact dermatitis, unspecified cause: Secondary | ICD-10-CM | POA: Diagnosis present

## 2020-12-03 DIAGNOSIS — I429 Cardiomyopathy, unspecified: Secondary | ICD-10-CM | POA: Diagnosis present

## 2020-12-03 DIAGNOSIS — R7989 Other specified abnormal findings of blood chemistry: Secondary | ICD-10-CM | POA: Diagnosis present

## 2020-12-03 DIAGNOSIS — I428 Other cardiomyopathies: Secondary | ICD-10-CM

## 2020-12-03 DIAGNOSIS — E1022 Type 1 diabetes mellitus with diabetic chronic kidney disease: Secondary | ICD-10-CM | POA: Diagnosis present

## 2020-12-03 DIAGNOSIS — I4891 Unspecified atrial fibrillation: Secondary | ICD-10-CM | POA: Diagnosis present

## 2020-12-03 DIAGNOSIS — Z91013 Allergy to seafood: Secondary | ICD-10-CM

## 2020-12-03 DIAGNOSIS — Z720 Tobacco use: Secondary | ICD-10-CM | POA: Diagnosis present

## 2020-12-03 DIAGNOSIS — K5900 Constipation, unspecified: Secondary | ICD-10-CM | POA: Diagnosis not present

## 2020-12-03 DIAGNOSIS — I700 Atherosclerosis of aorta: Secondary | ICD-10-CM | POA: Diagnosis present

## 2020-12-03 DIAGNOSIS — F1910 Other psychoactive substance abuse, uncomplicated: Secondary | ICD-10-CM | POA: Diagnosis present

## 2020-12-03 DIAGNOSIS — G9341 Metabolic encephalopathy: Secondary | ICD-10-CM | POA: Diagnosis present

## 2020-12-03 DIAGNOSIS — I10 Essential (primary) hypertension: Secondary | ICD-10-CM | POA: Diagnosis present

## 2020-12-03 DIAGNOSIS — N182 Chronic kidney disease, stage 2 (mild): Secondary | ICD-10-CM | POA: Diagnosis present

## 2020-12-03 DIAGNOSIS — E1065 Type 1 diabetes mellitus with hyperglycemia: Secondary | ICD-10-CM | POA: Diagnosis present

## 2020-12-03 DIAGNOSIS — R6520 Severe sepsis without septic shock: Secondary | ICD-10-CM

## 2020-12-03 DIAGNOSIS — T402X1A Poisoning by other opioids, accidental (unintentional), initial encounter: Principal | ICD-10-CM | POA: Diagnosis present

## 2020-12-03 DIAGNOSIS — Z8261 Family history of arthritis: Secondary | ICD-10-CM

## 2020-12-03 DIAGNOSIS — Z79899 Other long term (current) drug therapy: Secondary | ICD-10-CM

## 2020-12-03 DIAGNOSIS — D539 Nutritional anemia, unspecified: Secondary | ICD-10-CM | POA: Diagnosis present

## 2020-12-03 DIAGNOSIS — I071 Rheumatic tricuspid insufficiency: Secondary | ICD-10-CM | POA: Diagnosis present

## 2020-12-03 DIAGNOSIS — R4182 Altered mental status, unspecified: Secondary | ICD-10-CM

## 2020-12-03 DIAGNOSIS — E10649 Type 1 diabetes mellitus with hypoglycemia without coma: Secondary | ICD-10-CM | POA: Diagnosis not present

## 2020-12-03 DIAGNOSIS — A419 Sepsis, unspecified organism: Secondary | ICD-10-CM | POA: Diagnosis present

## 2020-12-03 DIAGNOSIS — I5021 Acute systolic (congestive) heart failure: Secondary | ICD-10-CM | POA: Diagnosis present

## 2020-12-03 DIAGNOSIS — J189 Pneumonia, unspecified organism: Secondary | ICD-10-CM | POA: Diagnosis present

## 2020-12-03 DIAGNOSIS — R931 Abnormal findings on diagnostic imaging of heart and coronary circulation: Secondary | ICD-10-CM | POA: Diagnosis present

## 2020-12-03 DIAGNOSIS — I248 Other forms of acute ischemic heart disease: Secondary | ICD-10-CM | POA: Diagnosis present

## 2020-12-03 DIAGNOSIS — R778 Other specified abnormalities of plasma proteins: Secondary | ICD-10-CM

## 2020-12-03 DIAGNOSIS — E872 Acidosis, unspecified: Secondary | ICD-10-CM

## 2020-12-03 DIAGNOSIS — Z794 Long term (current) use of insulin: Secondary | ICD-10-CM

## 2020-12-03 DIAGNOSIS — F32A Depression, unspecified: Secondary | ICD-10-CM | POA: Diagnosis present

## 2020-12-03 DIAGNOSIS — U071 COVID-19: Secondary | ICD-10-CM | POA: Diagnosis present

## 2020-12-03 DIAGNOSIS — J9601 Acute respiratory failure with hypoxia: Secondary | ICD-10-CM | POA: Diagnosis present

## 2020-12-03 DIAGNOSIS — Z833 Family history of diabetes mellitus: Secondary | ICD-10-CM

## 2020-12-03 DIAGNOSIS — I130 Hypertensive heart and chronic kidney disease with heart failure and stage 1 through stage 4 chronic kidney disease, or unspecified chronic kidney disease: Secondary | ICD-10-CM | POA: Diagnosis present

## 2020-12-03 DIAGNOSIS — J1282 Pneumonia due to coronavirus disease 2019: Secondary | ICD-10-CM | POA: Diagnosis present

## 2020-12-03 DIAGNOSIS — R7401 Elevation of levels of liver transaminase levels: Secondary | ICD-10-CM | POA: Diagnosis present

## 2020-12-03 DIAGNOSIS — R680 Hypothermia, not associated with low environmental temperature: Secondary | ICD-10-CM | POA: Diagnosis present

## 2020-12-03 DIAGNOSIS — F1020 Alcohol dependence, uncomplicated: Secondary | ICD-10-CM | POA: Diagnosis present

## 2020-12-03 DIAGNOSIS — G928 Other toxic encephalopathy: Secondary | ICD-10-CM | POA: Diagnosis present

## 2020-12-03 DIAGNOSIS — A4189 Other specified sepsis: Secondary | ICD-10-CM | POA: Diagnosis present

## 2020-12-03 DIAGNOSIS — F17210 Nicotine dependence, cigarettes, uncomplicated: Secondary | ICD-10-CM | POA: Diagnosis present

## 2020-12-03 LAB — ECHOCARDIOGRAM COMPLETE
AR max vel: 2.18 cm2
AV Area VTI: 2.24 cm2
AV Area mean vel: 2.08 cm2
AV Mean grad: 4 mmHg
AV Peak grad: 6.7 mmHg
Ao pk vel: 1.29 m/s
Area-P 1/2: 6.27 cm2
Height: 68 in
S' Lateral: 4.1 cm
Weight: 2800 oz

## 2020-12-03 LAB — CBC WITH DIFFERENTIAL/PLATELET
Abs Immature Granulocytes: 0.09 10*3/uL — ABNORMAL HIGH (ref 0.00–0.07)
Basophils Absolute: 0.1 10*3/uL (ref 0.0–0.1)
Basophils Relative: 0 %
Eosinophils Absolute: 0.1 10*3/uL (ref 0.0–0.5)
Eosinophils Relative: 1 %
HCT: 41.6 % (ref 39.0–52.0)
Hemoglobin: 12.8 g/dL — ABNORMAL LOW (ref 13.0–17.0)
Immature Granulocytes: 1 %
Lymphocytes Relative: 10 %
Lymphs Abs: 1.8 10*3/uL (ref 0.7–4.0)
MCH: 31.1 pg (ref 26.0–34.0)
MCHC: 30.8 g/dL (ref 30.0–36.0)
MCV: 101.2 fL — ABNORMAL HIGH (ref 80.0–100.0)
Monocytes Absolute: 1.9 10*3/uL — ABNORMAL HIGH (ref 0.1–1.0)
Monocytes Relative: 11 %
Neutro Abs: 13.7 10*3/uL — ABNORMAL HIGH (ref 1.7–7.7)
Neutrophils Relative %: 77 %
Platelets: 385 10*3/uL (ref 150–400)
RBC: 4.11 MIL/uL — ABNORMAL LOW (ref 4.22–5.81)
RDW: 14 % (ref 11.5–15.5)
WBC: 17.7 10*3/uL — ABNORMAL HIGH (ref 4.0–10.5)
nRBC: 0 % (ref 0.0–0.2)

## 2020-12-03 LAB — URINALYSIS, ROUTINE W REFLEX MICROSCOPIC
Bacteria, UA: NONE SEEN
Bilirubin Urine: NEGATIVE
Glucose, UA: >=500 mg/dL — AB
Hgb urine dipstick: NEGATIVE
Ketones, ur: 5 mg/dL — AB
Leukocytes,Ua: NEGATIVE
Nitrite: NEGATIVE
Protein, ur: NEGATIVE mg/dL
Specific Gravity, Urine: 1.03 (ref 1.005–1.030)
pH: 5 (ref 5.0–8.0)

## 2020-12-03 LAB — RAPID URINE DRUG SCREEN, HOSP PERFORMED
Amphetamines: NOT DETECTED
Barbiturates: NOT DETECTED
Benzodiazepines: NOT DETECTED
Cocaine: NOT DETECTED
Opiates: POSITIVE — AB
Tetrahydrocannabinol: POSITIVE — AB

## 2020-12-03 LAB — RESP PANEL BY RT-PCR (FLU A&B, COVID) ARPGX2
Influenza A by PCR: NEGATIVE
Influenza B by PCR: NEGATIVE
SARS Coronavirus 2 by RT PCR: POSITIVE — AB

## 2020-12-03 LAB — CBC
HCT: 37.8 % — ABNORMAL LOW (ref 39.0–52.0)
Hemoglobin: 12 g/dL — ABNORMAL LOW (ref 13.0–17.0)
MCH: 31.3 pg (ref 26.0–34.0)
MCHC: 31.7 g/dL (ref 30.0–36.0)
MCV: 98.7 fL (ref 80.0–100.0)
Platelets: 320 10*3/uL (ref 150–400)
RBC: 3.83 MIL/uL — ABNORMAL LOW (ref 4.22–5.81)
RDW: 14 % (ref 11.5–15.5)
WBC: 14.2 10*3/uL — ABNORMAL HIGH (ref 4.0–10.5)
nRBC: 0 % (ref 0.0–0.2)

## 2020-12-03 LAB — LACTIC ACID, PLASMA
Lactic Acid, Venous: 7.4 mmol/L (ref 0.5–1.9)
Lactic Acid, Venous: 0.8 mmol/L (ref 0.5–1.9)
Lactic Acid, Venous: 1.6 mmol/L (ref 0.5–1.9)
Lactic Acid, Venous: 1.2 mmol/L (ref 0.5–1.9)

## 2020-12-03 LAB — BLOOD GAS, VENOUS
Acid-base deficit: 4.1 mmol/L — ABNORMAL HIGH (ref 0.0–2.0)
Bicarbonate: 25.2 mmol/L (ref 20.0–28.0)
FIO2: 21
O2 Saturation: 55.6 %
Patient temperature: 98.6
pCO2, Ven: 69.7 mmHg — ABNORMAL HIGH (ref 44.0–60.0)
pH, Ven: 7.183 — CL (ref 7.250–7.430)
pO2, Ven: 37.9 mmHg (ref 32.0–45.0)

## 2020-12-03 LAB — COMPREHENSIVE METABOLIC PANEL
ALT: 48 U/L — ABNORMAL HIGH (ref 0–44)
AST: 88 U/L — ABNORMAL HIGH (ref 15–41)
Albumin: 4 g/dL (ref 3.5–5.0)
Alkaline Phosphatase: 117 U/L (ref 38–126)
Anion gap: 14 (ref 5–15)
BUN: 20 mg/dL (ref 6–20)
CO2: 22 mmol/L (ref 22–32)
Calcium: 8.9 mg/dL (ref 8.9–10.3)
Chloride: 101 mmol/L (ref 98–111)
Creatinine, Ser: 1.23 mg/dL (ref 0.61–1.24)
GFR, Estimated: >60 mL/min (ref >60)
Glucose, Bld: 261 mg/dL — ABNORMAL HIGH (ref 70–99)
Potassium: 5 mmol/L (ref 3.5–5.1)
Sodium: 137 mmol/L (ref 135–145)
Total Bilirubin: 0.4 mg/dL (ref 0.3–1.2)
Total Protein: 7.9 g/dL (ref 6.5–8.1)

## 2020-12-03 LAB — FOLATE: Folate: 54.1 ng/mL (ref >5.9)

## 2020-12-03 LAB — CREATININE, SERUM
Creatinine, Ser: 1.13 mg/dL (ref 0.61–1.24)
GFR, Estimated: >60 mL/min (ref >60)

## 2020-12-03 LAB — PROTIME-INR
INR: 0.9 (ref 0.8–1.2)
Prothrombin Time: 12.4 seconds (ref 11.4–15.2)

## 2020-12-03 LAB — CBG MONITORING, ED
Glucose-Capillary: 233 mg/dL — ABNORMAL HIGH (ref 70–99)
Glucose-Capillary: 383 mg/dL — ABNORMAL HIGH (ref 70–99)
Glucose-Capillary: 215 mg/dL — ABNORMAL HIGH (ref 70–99)

## 2020-12-03 LAB — TROPONIN I (HIGH SENSITIVITY)
Troponin I (High Sensitivity): 35 ng/L — ABNORMAL HIGH (ref <18)
Troponin I (High Sensitivity): 106 ng/L (ref <18)

## 2020-12-03 LAB — ETHANOL: Alcohol, Ethyl (B): <10 mg/dL (ref <10)

## 2020-12-03 LAB — SALICYLATE LEVEL: Salicylate Lvl: <7 mg/dL — ABNORMAL LOW (ref 7.0–30.0)

## 2020-12-03 LAB — VITAMIN B12: Vitamin B-12: 1064 pg/mL — ABNORMAL HIGH (ref 180–914)

## 2020-12-03 LAB — ACETAMINOPHEN LEVEL: Acetaminophen (Tylenol), Serum: <10 ug/mL — ABNORMAL LOW (ref 10–30)

## 2020-12-03 LAB — APTT: aPTT: 31 seconds (ref 24–36)

## 2020-12-03 MED ORDER — LORAZEPAM 2 MG/ML IJ SOLN: 1.0000 mg | INTRAMUSCULAR | Status: AC | PRN

## 2020-12-03 MED ORDER — IOHEXOL 350 MG/ML SOLN
100.0000 mL | Freq: Once | INTRAVENOUS | Status: AC | PRN
  Administered 2020-12-03: 100 mL via INTRAVENOUS

## 2020-12-03 MED ORDER — VANCOMYCIN HCL 1750 MG/350ML IV SOLN
1750.0000 mg | Freq: Once | INTRAVENOUS | Status: AC
  Administered 2020-12-03: 1750 mg via INTRAVENOUS
  Filled 2020-12-03: qty 350

## 2020-12-03 MED ORDER — GUAIFENESIN-DM 100-10 MG/5ML PO SYRP: 10.0000 mL | ORAL_SOLUTION | ORAL | Status: DC | PRN

## 2020-12-03 MED ORDER — ZINC SULFATE 220 (50 ZN) MG PO CAPS
220.0000 mg | ORAL_CAPSULE | Freq: Every day | ORAL | Status: DC
  Administered 2020-12-03 – 2020-12-07 (×5): 220 mg via ORAL
  Filled 2020-12-03 (×5): qty 1

## 2020-12-03 MED ORDER — PERFLUTREN LIPID MICROSPHERE
1.0000 mL | INTRAVENOUS | Status: AC | PRN
  Administered 2020-12-03: 1 mL via INTRAVENOUS
  Filled 2020-12-03: qty 10

## 2020-12-03 MED ORDER — INSULIN ASPART 100 UNIT/ML IJ SOLN
0.0000 [IU] | Freq: Three times a day (TID) | INTRAMUSCULAR | Status: DC
  Administered 2020-12-03: 15 [IU] via SUBCUTANEOUS
  Administered 2020-12-03: 5 [IU] via SUBCUTANEOUS
  Administered 2020-12-04 – 2020-12-05 (×3): 3 [IU] via SUBCUTANEOUS
  Administered 2020-12-06: 11 [IU] via SUBCUTANEOUS
  Administered 2020-12-06: 3 [IU] via SUBCUTANEOUS
  Filled 2020-12-03: qty 0.15

## 2020-12-03 MED ORDER — SODIUM CHLORIDE 0.9 % IV SOLN
200.0000 mg | Freq: Once | INTRAVENOUS | Status: AC
  Administered 2020-12-03: 200 mg via INTRAVENOUS
  Filled 2020-12-03: qty 40

## 2020-12-03 MED ORDER — ACETAMINOPHEN 650 MG RE SUPP: 650.0000 mg | Freq: Four times a day (QID) | RECTAL | Status: DC | PRN

## 2020-12-03 MED ORDER — SODIUM CHLORIDE 0.9 % IV BOLUS
1000.0000 mL | Freq: Once | INTRAVENOUS | Status: AC
  Administered 2020-12-03: 1000 mL via INTRAVENOUS

## 2020-12-03 MED ORDER — INSULIN GLARGINE-YFGN 100 UNIT/ML ~~LOC~~ SOLN
15.0000 [IU] | Freq: Every day | SUBCUTANEOUS | Status: DC
  Administered 2020-12-03 – 2020-12-04 (×2): 15 [IU] via SUBCUTANEOUS
  Filled 2020-12-03 (×2): qty 0.15

## 2020-12-03 MED ORDER — FOLIC ACID 1 MG PO TABS
1.0000 mg | ORAL_TABLET | Freq: Every day | ORAL | Status: DC
  Administered 2020-12-03 – 2020-12-07 (×5): 1 mg via ORAL
  Filled 2020-12-03 (×5): qty 1

## 2020-12-03 MED ORDER — HYDROCOD POLST-CPM POLST ER 10-8 MG/5ML PO SUER: 5.0000 mL | Freq: Two times a day (BID) | ORAL | Status: DC | PRN

## 2020-12-03 MED ORDER — SODIUM CHLORIDE 0.9 % IV SOLN
2.0000 g | INTRAVENOUS | Status: DC
  Administered 2020-12-03 – 2020-12-05 (×3): 2 g via INTRAVENOUS
  Filled 2020-12-03 (×3): qty 20

## 2020-12-03 MED ORDER — ONDANSETRON HCL 4 MG PO TABS: 4.0000 mg | ORAL_TABLET | Freq: Four times a day (QID) | ORAL | Status: DC | PRN

## 2020-12-03 MED ORDER — SODIUM CHLORIDE 0.9 % IV SOLN: 200.0000 mg | Freq: Once | INTRAVENOUS | Status: DC

## 2020-12-03 MED ORDER — INSULIN ASPART 100 UNIT/ML IJ SOLN
0.0000 [IU] | Freq: Every day | INTRAMUSCULAR | Status: DC
  Administered 2020-12-03: 2 [IU] via SUBCUTANEOUS
  Administered 2020-12-05: 3 [IU] via SUBCUTANEOUS
  Filled 2020-12-03: qty 0.05

## 2020-12-03 MED ORDER — SODIUM CHLORIDE 0.9 % IV SOLN: 100.0000 mg | Freq: Every day | INTRAVENOUS | Status: DC

## 2020-12-03 MED ORDER — THIAMINE HCL 100 MG/ML IJ SOLN
100.0000 mg | Freq: Every day | INTRAMUSCULAR | Status: DC
  Filled 2020-12-03: qty 2

## 2020-12-03 MED ORDER — SODIUM CHLORIDE 0.9 % IV SOLN: INTRAVENOUS | Status: DC

## 2020-12-03 MED ORDER — ENOXAPARIN SODIUM 40 MG/0.4ML IJ SOSY
40.0000 mg | PREFILLED_SYRINGE | INTRAMUSCULAR | Status: DC
  Administered 2020-12-03 – 2020-12-06 (×4): 40 mg via SUBCUTANEOUS
  Filled 2020-12-03 (×4): qty 0.4

## 2020-12-03 MED ORDER — PIPERACILLIN-TAZOBACTAM 3.375 G IVPB 30 MIN
3.3750 g | Freq: Once | INTRAVENOUS | Status: AC
  Administered 2020-12-03: 3.375 g via INTRAVENOUS
  Filled 2020-12-03: qty 50

## 2020-12-03 MED ORDER — THIAMINE HCL 100 MG PO TABS
100.0000 mg | ORAL_TABLET | Freq: Every day | ORAL | Status: DC
  Administered 2020-12-03 – 2020-12-07 (×5): 100 mg via ORAL
  Filled 2020-12-03 (×5): qty 1

## 2020-12-03 MED ORDER — ADULT MULTIVITAMIN W/MINERALS CH
1.0000 | ORAL_TABLET | Freq: Every day | ORAL | Status: DC
  Administered 2020-12-03 – 2020-12-07 (×5): 1 via ORAL
  Filled 2020-12-03 (×5): qty 1

## 2020-12-03 MED ORDER — IPRATROPIUM-ALBUTEROL 20-100 MCG/ACT IN AERS
1.0000 | INHALATION_SPRAY | Freq: Four times a day (QID) | RESPIRATORY_TRACT | Status: DC
  Administered 2020-12-03 – 2020-12-04 (×2): 1 via RESPIRATORY_TRACT
  Filled 2020-12-03: qty 4

## 2020-12-03 MED ORDER — NALOXONE HCL 0.4 MG/ML IJ SOLN: 0.4000 mg | INTRAMUSCULAR | Status: DC | PRN

## 2020-12-03 MED ORDER — SODIUM CHLORIDE 0.9 % IV SOLN
100.0000 mg | Freq: Every day | INTRAVENOUS | Status: AC
  Administered 2020-12-04 – 2020-12-07 (×4): 100 mg via INTRAVENOUS
  Filled 2020-12-03 (×4): qty 20

## 2020-12-03 MED ORDER — DILTIAZEM HCL 25 MG/5ML IV SOLN
10.0000 mg | Freq: Once | INTRAVENOUS | Status: DC
  Filled 2020-12-03: qty 5

## 2020-12-03 MED ORDER — ACETAMINOPHEN 325 MG PO TABS
650.0000 mg | ORAL_TABLET | Freq: Four times a day (QID) | ORAL | Status: DC | PRN
  Administered 2020-12-04 – 2020-12-07 (×7): 650 mg via ORAL
  Filled 2020-12-03 (×6): qty 2

## 2020-12-03 MED ORDER — ONDANSETRON HCL 4 MG/2ML IJ SOLN
4.0000 mg | Freq: Four times a day (QID) | INTRAMUSCULAR | Status: DC | PRN
  Administered 2020-12-03: 4 mg via INTRAVENOUS
  Filled 2020-12-03: qty 2

## 2020-12-03 MED ORDER — SODIUM CHLORIDE 0.9 % IV SOLN
500.0000 mg | INTRAVENOUS | Status: DC
  Administered 2020-12-03 – 2020-12-04 (×2): 500 mg via INTRAVENOUS
  Filled 2020-12-03 (×2): qty 500

## 2020-12-03 MED ORDER — ASCORBIC ACID 500 MG PO TABS
500.0000 mg | ORAL_TABLET | Freq: Every day | ORAL | Status: DC
  Administered 2020-12-03 – 2020-12-07 (×5): 500 mg via ORAL
  Filled 2020-12-03 (×5): qty 1

## 2020-12-03 MED ORDER — LORAZEPAM 1 MG PO TABS
1.0000 mg | ORAL_TABLET | ORAL | Status: AC | PRN
  Administered 2020-12-05: 2 mg via ORAL
  Filled 2020-12-03: qty 2

## 2020-12-03 NOTE — H&P (Addendum)
History and Physical    [name] YIR:[id number] DOB: [birth year] DOA: 12/03/2020  PCP: Pcp, No  Patient coming from: Home  Chief Complaint: "He was unresponsive."  HPI: [name] is a 45 y.o. male with medical history significant of DM1, depression, HTN. Presenting with AMS. History is from wife at bedside. She reports that the patient was his normal self when she saw him around 10p last night. Prior to last night, he had complaints of intermittent dizziness for a couple of days, but seemed fine. She found him this morning around 3a in his room. He had some substance streaming from his mouth and he breathing was barely audible. She tried to wake him up, but he was not responsive. She was having trouble hearing him breathe. She called EMS and started compressions on him. She is not sure of how long she was doing compressions, but EMS did arrive and administer narcan. He began to wake up. He was transported to the ED for further eval. She denies any other aggravating or alleviating factors.    ED Course: He was found to be hypothermic w/ tachycardia and elevated lactic acid. CTA PE showed possible PNA. He was COVID positive. Sepsis protocol was initiated. TRH was called for admission.   Review of Systems:  He denies CP, palpitations, N/V/D, fever, sick contacts. Review of systems is otherwise negative for all not mentioned in HPI.   PMHx Past Medical History:  Diagnosis Date   CKD (chronic kidney disease), stage II    Diabetes mellitus without complication ([place])    type 1   Polysubstance abuse ([place])     PSHx Past Surgical History:  Procedure Laterality Date   DENTAL SURGERY      SocHx  reports that he has been smoking cigarettes. He has a 10.00 pack-year smoking history. He has never used smokeless tobacco. He reports current alcohol use of about 40.0 standard drinks per week. He reports current drug use. Frequency: 5.00 times per week. Drugs: Marijuana and  Cocaine.  Allergies  Allergen Reactions   Shellfish Allergy Anaphylaxis    FamHx Family History  Problem Relation Age of Onset   Arthritis Mother    Diabetes Mellitus II Father     Prior to Admission medications   Medication Sig Start Date End Date Taking? Authorizing Provider  insulin aspart (NOVOLOG) 100 UNIT/ML injection Inject 5 Units into the skin with breakfast, with lunch, and with evening meal. Patient taking differently: Inject 0-10 Units into the skin See admin instructions. Check blood sugar with each meal and inject insulin according to sliding scale 11/23/20  Yes Khatri, Hina, PA-C  insulin glargine (LANTUS) 100 UNIT/ML injection Inject 0.15 mLs (15 Units total) into the skin at bedtime. Patient taking differently: Inject 30 Units into the skin at bedtime. 11/23/20  Yes Khatri, Hina, PA-C  Blood Glucose Monitoring Suppl (TRUE METRIX METER) w/Device KIT Use kit to check blood glucose up to four times daily as directed 11/23/20   Khatri, Hina, PA-C  escitalopram (LEXAPRO) 20 MG tablet Take 1 tablet (20 mg total) by mouth daily. Patient not taking: No sig reported 07/31/18 10/29/18  Kerin Perna, NP  glucose blood (TRUE METRIX BLOOD GLUCOSE TEST) test strip Use as instructed 11/23/20   Khatri, Hina, PA-C  insulin aspart (NOVOLOG) 100 UNIT/ML injection Inject 5 Units into the skin 3 (three) times daily with meals. Patient not taking: Reported on 12/03/2020 04/08/19   Kerin Perna, NP  Insulin Pen Needle (PEN NEEDLES 3/16") 31G  X 5 MM MISC use one pen needle daily. 11/23/20   Khatri, Hina, PA-C  Insulin Syringe-Needle U-100 (INSULIN SYRINGE .5CC/30GX1/2") 30G X 1/2" 0.5 ML MISC Use 1-3 times with insulin Patient not taking: No sig reported 11/10/18   Kerin Perna, NP  Insulin Syringe-Needle U-100 31G X 5/16" 0.3 ML MISC [place] as directed as needed. 11/23/20   Khatri, Hina, PA-C  lisinopril (ZESTRIL) 5 MG tablet Take 1 tablet (5 mg total) by mouth daily. Patient not taking: No  sig reported 09/03/18   Kerin Perna, NP  pravastatin (PRAVACHOL) 20 MG tablet Take 1 tablet (20 mg total) by mouth daily. Patient not taking: No sig reported 09/03/18   Kerin Perna, NP  TRUEplus Lancets 28G MISC Use as directed 11/23/20   Khatri, Hina, PA-C  blood glucose meter kit and supplies KIT Dispense based on patient and insurance preference. Use up to four times daily as directed. (FOR ICD-9 250.00, 250.01). Patient not taking: No sig reported 01/28/18 11/23/20  Clent Demark, PA-C    Physical Exam: Vitals:   12/03/20 1000 12/03/20 1002 12/03/20 1015 12/03/20 1030  BP: 129/84 129/84 128/87 (!) 132/93  Pulse: (!) 107 (!) 107 (!) 109 (!) 110  Resp: _0 Temp:      TempSrc:      SpO2: 97% 97% 99% 98%  Weight:      Height:        General: 45 y.o. male resting in bed in NAD Eyes: PERRL, normal sclera ENMT: Nares patent w/o discharge, orophaynx clear, dentition normal, ears w/o discharge/lesions/ulcers Neck: Supple, trachea midline Cardiovascular: RRR, +S1, S2, no m/g/r, equal pulses throughout Respiratory: CTABL, no w/r/r, normal WOB GI: BS+, NDNT, no masses noted, no organomegaly noted MSK: No e/c/c Skin: No rashes, bruises, ulcerations noted Neuro: A&O x 3, no focal deficits Psyc: Slightly confused, but calm/cooperative  Labs on Admission: I have personally reviewed following labs and imaging studies  CBC: Recent Labs  Lab 12/03/20 0534  WBC 17.7*  NEUTROABS 13.7*  HGB 12.8*  HCT 41.6  MCV 101.2*  PLT 540   Basic Metabolic Panel: Recent Labs  Lab 12/03/20 0534  NA 137  K 5.0  CL 101  CO2 22  GLUCOSE 261*  BUN 20  CREATININE 1.23  CALCIUM 8.9   GFR: Estimated Creatinine Clearance: 71.8 mL/min (by C-G formula based on SCr of 1.23 mg/dL). Liver Function Tests: Recent Labs  Lab 12/03/20 0534  AST 88*  ALT 48*  ALKPHOS 117  BILITOT 0.4  PROT 7.9  ALBUMIN 4.0   No results for input(s): LIPASE, AMYLASE in the last 168  hours. No results for input(s): AMMONIA in the last 168 hours. Coagulation Profile: Recent Labs  Lab 12/03/20 0534  INR 0.9   Cardiac Enzymes: No results for input(s): CKTOTAL, CKMB, CKMBINDEX, TROPONINI in the last 168 hours. BNP (last 3 results) No results for input(s): PROBNP in the last 8760 hours. HbA1C: No results for input(s): HGBA1C in the last 72 hours. CBG: Recent Labs  Lab 12/03/20 0541  GLUCAP 233*   Lipid Profile: No results for input(s): CHOL, HDL, LDLCALC, TRIG, CHOLHDL, LDLDIRECT in the last 72 hours. Thyroid Function Tests: No results for input(s): TSH, T4TOTAL, FREET4, T3FREE, THYROIDAB in the last 72 hours. Anemia Panel: No results for input(s): VITAMINB12, FOLATE, FERRITIN, TIBC, IRON, RETICCTPCT in the last 72 hours. Urine analysis:    Component Value Date/Time   COLORURINE STRAW (A) 11/23/2020 0620   APPEARANCEUR  CLEAR 11/23/2020 0620   LABSPEC 1.029 11/23/2020 0620   PHURINE 6.0 11/23/2020 0620   GLUCOSEU >=500 (A) 11/23/2020 0620   HGBUR NEGATIVE 11/23/2020 0620   BILIRUBINUR NEGATIVE 11/23/2020 0620   KETONESUR NEGATIVE 11/23/2020 0620   PROTEINUR NEGATIVE 11/23/2020 0620   UROBILINOGEN 0.2 12/27/2013 2325   NITRITE NEGATIVE 11/23/2020 0620   LEUKOCYTESUR NEGATIVE 11/23/2020 0620    Radiological Exams on Admission: CT HEAD WO CONTRAST (5MM)  Result Date: 12/03/2020 CLINICAL DATA:  Syncope, recurrent LOC, unresponsive, s/p CPR, AMS EXAM: CT HEAD WITHOUT CONTRAST TECHNIQUE: Contiguous axial images were obtained from the base of the skull through the vertex without intravenous contrast. COMPARISON:  None. FINDINGS: Brain: No evidence of acute infarction, hemorrhage, hydrocephalus, extra-axial collection or mass lesion/mass effect. Vascular: No hyperdense vessel or unexpected calcification. Skull: Normal. Negative for fracture or focal lesion. Sinuses/Orbits: Complete opacification of the left maxillary sinus. Mild right maxillary sinus mucosal  thickening. Other: None. IMPRESSION: 1. No acute intracranial findings. 2. Complete opacification of the left maxillary sinus. Mild right maxillary sinus mucosal thickening. Electronically Signed   By: Davina Poke D.O.   On: 12/03/2020 10:13   CT Angio Chest PE W and/or Wo Contrast  Result Date: 12/03/2020 CLINICAL DATA:  Syncope, unresponsive, CPR, COVID positive 12/03/2020 EXAM: CT ANGIOGRAPHY CHEST WITH CONTRAST TECHNIQUE: Multidetector CT imaging of the chest was performed using the standard protocol during bolus administration of intravenous contrast. Multiplanar CT image reconstructions and MIPs were obtained to evaluate the vascular anatomy. CONTRAST:  124m OMNIPAQUE IOHEXOL 350 MG/ML SOLN COMPARISON:  11/22/2007 FINDINGS: Cardiovascular: Minor aortic atherosclerosis. Pulsation artifact across the ascending aorta. Negative for aneurysm or dissection. No mediastinal hemorrhage or hematoma. Central pulmonary arteries appear patent. No significant filling defect or pulmonary embolus by CTA. Normal heart size. No pericardial effusion. Central venous structures are patent. No veno-occlusive process. Mediastinum/Nodes: No enlarged mediastinal, hilar, or axillary lymph nodes. Thyroid gland, trachea, and esophagus demonstrate no significant findings. Lungs/Pleura: Bilateral lower lobe patchy and nodular peribronchovascular airspace opacities compatible with pneumonia and or aspiration. Scattered areas of ground-glass attenuation related to hypoventilatory changes. Trachea central airways are patent. No pleural abnormality, effusion or pneumothorax. Upper Abdomen: No acute abnormality. Musculoskeletal: No chest wall abnormality. No acute or significant osseous findings. Review of the MIP images confirms the above findings. IMPRESSION: Negative for significant acute pulmonary embolus by CTA. No other acute intrathoracic vascular finding. Patchy and nodular peribronchovascular airspace opacities compatible  with pneumonia or aspiration. Additional bilateral scattered patchy ground-glass attenuation favored to be hypoventilatory changes. Aortic Atherosclerosis (ICD10-I70.0). Electronically Signed   By: MJerilynn Mages  Shick M.D.   On: 12/03/2020 09:59   CT ABDOMEN PELVIS W CONTRAST  Result Date: 12/03/2020 CLINICAL DATA:  Abdominal pain.  Unresponsive. EXAM: CT ABDOMEN AND PELVIS WITH CONTRAST TECHNIQUE: Multidetector CT imaging of the abdomen and pelvis was performed using the standard protocol following bolus administration of intravenous contrast. CONTRAST:  1057mOMNIPAQUE IOHEXOL 350 MG/ML SOLN COMPARISON:  None. FINDINGS: Lower chest: Bibasilar airspace disease which may reflect bibasilar pneumonia versus aspiration pneumonia. Hepatobiliary: No focal liver abnormality is seen. No gallstones, gallbladder wall thickening, or biliary dilatation. Pancreas: Unremarkable. No pancreatic ductal dilatation or surrounding inflammatory changes. Spleen: Normal in size without focal abnormality. Adrenals/Urinary Tract: Adrenal glands are unremarkable. Kidneys are normal, without renal calculi, focal lesion, or hydronephrosis. Bladder is unremarkable. Stomach/Bowel: Severe gastric distension with a large air-fluid level. Small mild distal to the stomach is decompressed. Appendix appears normal. No evidence of bowel wall thickening,  distention, or inflammatory changes. Large amount of stool throughout the colon. Vascular/Lymphatic: No significant vascular findings are present. No enlarged abdominal or pelvic lymph nodes. Reproductive: Prostate is unremarkable. Other: No abdominal wall hernia or abnormality. No abdominopelvic ascites. Musculoskeletal: No acute or significant osseous findings. IMPRESSION: 1. Bibasilar airspace disease as can be seen with bibasilar pneumonia versus aspiration pneumonia. 2. Severe gastric distension with a large air-fluid level. Small mild distal to the stomach is decompressed. This appearance can be seen  with gastroparesis versus gastric outlet obstruction or gastroenteritis. Electronically Signed   By: Kathreen Devoid M.D.   On: 12/03/2020 10:16   DG Chest Port 1 View  Result Date: 12/03/2020 CLINICAL DATA:  Shortness of breath EXAM: PORTABLE CHEST 1 VIEW COMPARISON:  03/13/2017 FINDINGS: Normal heart size and mediastinal contours. There is no edema, consolidation, effusion, or pneumothorax. No acute osseous finding. IMPRESSION: Negative portable chest. Electronically Signed   By: Monte Fantasia M.D.   On: 12/03/2020 05:54    EKG: Independently reviewed. Sinus tach, no st elevations  Assessment/Plan Acute toxic encephalopathy Polysubstance abuse (tobacco, EtOH, illicit Rx) Overdose on opioid     - admitted to inpt, progressive     - PRN narcan ordered as mentation improved after narcan use     - he denies opioid usage; UDS is positive for opioid and THC     - counseled against further use of drugs. EtOH and tobacco     - CIWA protocol  Pneumonia (Asp PNA vs CAP vs COVID 19) Sepsis secondary to above     - it is most likely that he aspirated     - procal pending; he got vanc/zosyn in ED; we'll continue CAP abx for now     - he's COVID-19 positive, see below  COVID 19     - will add remdes, combivent     - airborne isolation for now     - monitor inflammatory markers  DM1 w/ hyperglycemia     - history of non-compliance     - resume home insulin, add SSI, DM diet, glucose checks     - last A1c was 12.4 on 11/23/20  Elevated LFTs     - check hepatitis panel; RUQ [place]  Elevated troponin     - EDP spoke with cardiology; believed to be demand ischemia; rec'd echo only for now; if he develops CP, reconsult  Macrocytic anemia     - check B12, folate, thiamine  DVT prophylaxis: lovenox  Code Status: FULL  Family Communication: w/ wife at bedside  Consults called: EDP spoke with cardiology   Status is: Inpatient  Remains inpatient appropriate because:Inpatient level of care  appropriate due to severity of illness  Dispo: The patient is from: Home              Anticipated d/c is to: Home              Patient currently is not medically stable to d/c.   Difficult to place patient No  Time spent coordinating admission: 70 minutes  [facility]  If 7PM-7AM, please contact night-coverage [email or web address]  12/03/2020, 10:50 AM

## 2020-12-03 NOTE — ED Notes (Signed)
[facility] Ph 7.183  Reported to S Rancour

## 2020-12-03 NOTE — Progress Notes (Signed)
A consult was received from an ED provider for vancomycin and piperacillin/tazobactam per pharmacy dosing.  The patient's profile has been reviewed for ht/wt/allergies/indication/available labs.    Allergies  Allergen Reactions   Shellfish Allergy Anaphylaxis   TBW = 79 kg  A one time order has been placed for piperacillin/tazobactam 3.375 g IV over 30 mins + vancomycin 1750 mg IV once.    Further antibiotics/pharmacy consults should be ordered by admitting physician if indicated.                       Thank you, Cindi Carbon, PharmD 12/03/2020  6:52 AM

## 2020-12-03 NOTE — ED Triage Notes (Signed)
Pt to ED via [facility] from home for altered mental status.  Spouse told EMS he was fine before she went to bed but unresponsive this morning.  Narcan 1mg  given in route.  BG:  273

## 2020-12-03 NOTE — Progress Notes (Signed)
Inpatient Diabetes Program Recommendations  AACE/ADA: New Consensus Statement on Inpatient Glycemic Control (2015)  Target Ranges:  Prepandial:   less than 140 mg/dL      Peak postprandial:   less than 180 mg/dL (1-2 hours)      Critically ill patients:  140 - 180 mg/dL   Lab Results  Component Value Date   GLUCAP 233 (H) 12/03/2020   HGBA1C 12.4 (H) 11/23/2020    Review of Glycemic Control  Diabetes history: DM2 Outpatient Diabetes medications: Lantus 15 units QD, Novolog 5 units TID Current orders for Inpatient glycemic control: None at present  HgbA1C 12.4% Covid 19+  Inpatient Diabetes Program Recommendations:    Add Semglee 10 units Q24H Add Novolog 0-9 units Q4H  Titrate basal insulin if blood sugars above goal of 140-180 mg/dL. When eating/taking po's, will need meal coverage insulin - Novolog 4 units TID  Spoke with pt 9 days ago and educated on diabetes and insulin pen administration. Will need to make certain pt has PCP to follow-up with.  Continue to follow.  Thank you. Ailene Ards, RD, LDN, CDE Inpatient Diabetes Coordinator [phone]

## 2020-12-03 NOTE — ED Provider Notes (Signed)
Level 5 caveat for acuity of condition.  Patient arrives via EMS with altered mental status.  Patient's wife has arrived she states she went to bed around 10 PM when patient was normal.  Patient's wife awoke around 3 AM and found the patient slumped over on the couch, diaphoretic, not breathing by her suspicion and unresponsive.  She pulled him to the ground and began CPR.  This was not relayed by EMS initially.  EMS did give him Narcan and he became more arousable and alert.  On arrival he had a rapid atrial fibrillation about 180s to 200.  He was somnolent but oriented to person and place. Blood sugar was 233.  Denies any ingestion other than marijuana.  Hypothermic to 95.  Code sepsis initiated and patient started on broad-spectrum antibiotics, warming measures and IV fluids.  Cultures obtained.  Unclear source of sepsis.  Does not appear to be in DKA with anion gap is normal with a lactate of 7.5. Bicarb is 22 and anion gap is 14.  Heart rate has improved to the 120s and seems to be in sinus now. Patient is alert but confused.  Will obtain CT head as well as imaging of his abdomen and pelvis to assess for source of lactic acidosis.  CRITICAL CARE Performed by: Glynn Octave Total critical care time: 45 minutes Critical care time was exclusive of separately billable procedures and treating other patients. Critical care was necessary to treat or prevent imminent or life-threatening deterioration. Critical care was time spent personally by me on the following activities: development of treatment plan with patient and/or surrogate as well as nursing, discussions with consultants, evaluation of patient's response to treatment, examination of patient, obtaining history from patient or surrogate, ordering and performing treatments and interventions, ordering and review of laboratory studies, ordering and review of radiographic studies, pulse oximetry and re-evaluation of patient's condition.     Glynn Octave, MD 12/03/20 [phone]

## 2020-12-03 NOTE — ED Provider Notes (Signed)
[facility] DEPT Provider Note   CSN: [id number] Arrival date & time: 12/03/20  0515     History No chief complaint on file.   [name] is a 45 y.o. male.  Patient with past medical history notable for CKD, type 1 diabetes, polysubstance abuse presents to the emergency department with a chief complaint of questionable overdose.  Reportedly, the wife found the patient with sonorous respirations, which are different from normal.  EMS was called.  Patient was not very responsive and was given 1 mg of Narcan with some improvement of his mental status.  Level 5 caveat applies secondary to acuity of condition.  The history is provided by the patient. No language interpreter was used.      Past Medical History:  Diagnosis Date   CKD (chronic kidney disease), stage II    Diabetes mellitus without complication ([place])    type 1   Polysubstance abuse ([place])     Patient Active Problem List   Diagnosis Date Noted   Tobacco abuse 03/13/2017   Acute renal failure superimposed on stage 2 chronic kidney disease ([place]) 03/13/2017   Hyperkalemia 03/13/2017   SIRS (systemic inflammatory response syndrome) ([place]) 03/13/2017   Type 1 diabetes mellitus with other specified complication ([place]) 65/06/5463   Neuropathy 12/28/2013   Substance abuse ([place]) 12/28/2013   Alcohol dependence ([place]) 12/28/2013   Abdominal pain, epigastric 12/28/2013   Protein-calorie malnutrition, severe ([place]) 12/28/2013   DKA (diabetic ketoacidoses) 12/27/2013    Past Surgical History:  Procedure Laterality Date   DENTAL SURGERY         Family History  Problem Relation Age of Onset   Arthritis Mother    Diabetes Mellitus II Father     Social History   Tobacco Use   Smoking status: Every Day    Packs/day: 0.50    Years: 20.00    Pack years: 10.00    Types: Cigarettes   Smokeless tobacco: Never  Vaping Use   Vaping Use: Never used  Substance Use Topics   Alcohol use:  Yes    Alcohol/week: 40.0 standard drinks    Types: 40 Cans of beer per week   Drug use: Yes    Frequency: 5.0 times per week    Types: Marijuana, Cocaine    Home Medications Prior to Admission medications   Medication Sig Start Date End Date Taking? Authorizing Provider  Blood Glucose Monitoring Suppl (TRUE METRIX METER) w/Device KIT Use kit to check blood glucose up to four times daily as directed 11/23/20   Khatri, Hina, PA-C  escitalopram (LEXAPRO) 20 MG tablet Take 1 tablet (20 mg total) by mouth daily. Patient not taking: Reported on 11/23/2020 07/31/18 10/29/18  Kerin Perna, NP  glucose blood (TRUE METRIX BLOOD GLUCOSE TEST) test strip Use as instructed 11/23/20   Khatri, Hina, PA-C  insulin aspart (NOVOLOG) 100 UNIT/ML injection Inject 5 Units into the skin 3 (three) times daily with meals. 04/08/19   Kerin Perna, NP  insulin aspart (NOVOLOG) 100 UNIT/ML injection Inject 5 Units into the skin with breakfast, with lunch, and with evening meal. 11/23/20   Khatri, Hina, PA-C  insulin glargine (LANTUS) 100 UNIT/ML injection Inject 0.15 mLs (15 Units total) into the skin at bedtime. 11/23/20   Khatri, Hina, PA-C  Insulin Pen Needle (PEN NEEDLES 3/16") 31G X 5 MM MISC use one pen needle daily. 11/23/20   Khatri, Hina, PA-C  Insulin Syringe-Needle U-100 (INSULIN SYRINGE .5CC/30GX1/2") 30G X 1/2" 0.5 ML  MISC Use 1-3 times with insulin Patient not taking: No sig reported 11/10/18   Kerin Perna, NP  Insulin Syringe-Needle U-100 31G X 5/16" 0.3 ML MISC [place] as directed as needed. 11/23/20   Khatri, Hina, PA-C  lisinopril (ZESTRIL) 5 MG tablet Take 1 tablet (5 mg total) by mouth daily. Patient not taking: No sig reported 09/03/18   Kerin Perna, NP  pravastatin (PRAVACHOL) 20 MG tablet Take 1 tablet (20 mg total) by mouth daily. Patient not taking: No sig reported 09/03/18   Kerin Perna, NP  TRUEplus Lancets 28G MISC Use as directed 11/23/20   Khatri, Hina, PA-C  blood glucose  meter kit and supplies KIT Dispense based on patient and insurance preference. Use up to four times daily as directed. (FOR ICD-9 250.00, 250.01). Patient not taking: No sig reported 01/28/18 11/23/20  Clent Demark, PA-C    Allergies    Shellfish allergy  Review of Systems   Review of Systems  Unable to perform ROS: Acuity of condition   Physical Exam Updated Vital Signs There were no vitals taken for this visit.  Physical Exam Vitals and nursing note reviewed.  Constitutional:      Appearance: He is well-developed.  HENT:     Head: Normocephalic and atraumatic.  Eyes:     Conjunctiva/sclera: Conjunctivae normal.  Cardiovascular:     Rate and Rhythm: Regular rhythm. Tachycardia present.     Heart sounds: No murmur heard. Pulmonary:     Effort: Pulmonary effort is normal. No respiratory distress.     Breath sounds: Normal breath sounds.  Abdominal:     Palpations: Abdomen is soft.     Tenderness: There is no abdominal tenderness.  Musculoskeletal:     Cervical back: Neck supple.  Skin:    General: Skin is warm and dry.  Neurological:     Mental Status: He is alert.     Comments: Answering questions appropriately  Psychiatric:     Comments: Unable to assess    ED Results / Procedures / Treatments   Labs (all labs ordered are listed, but only abnormal results are displayed) Labs Reviewed  RESP PANEL BY RT-PCR (FLU A&B, COVID) ARPGX2  COMPREHENSIVE METABOLIC PANEL  SALICYLATE LEVEL  ACETAMINOPHEN LEVEL  ETHANOL  RAPID URINE DRUG SCREEN, HOSP PERFORMED  CBC WITH DIFFERENTIAL/PLATELET  CBG MONITORING, ED  CBG MONITORING, ED  TROPONIN I (HIGH SENSITIVITY)    EKG None  Radiology No results found.  Procedures .Critical Care  Date/Time: 12/03/2020 5:52 AM Performed by: Montine Circle, PA-C Authorized by: Montine Circle, PA-C   Critical care provider statement:    Critical care time (minutes):  44   Critical care was necessary to treat or prevent  imminent or life-threatening deterioration of the following conditions:  Metabolic crisis   Critical care was time spent personally by me on the following activities:  Discussions with consultants, evaluation of patient's response to treatment, examination of patient, ordering and performing treatments and interventions, ordering and review of laboratory studies, ordering and review of radiographic studies, pulse oximetry, re-evaluation of patient's condition, obtaining history from patient or surrogate and review of old charts   Medications Ordered in ED Medications  sodium chloride 0.9 % bolus 1,000 mL (has no administration in time range)    ED Course  I have reviewed the triage vital signs and the nursing notes.  Pertinent labs & imaging results that were available during my care of the patient were reviewed by me and  considered in my medical decision making (see chart for details).  Clinical Course as of 12/04/20 2207  Fri Dec 03, 2020  0635 AMS>>DKA, vs sepsis. Plan to add abx, admit [BH]  1048 Specimen Description: BLOOD RIGHT ANTECUBITAL Performed at [facility] [address], [place], [place] [address]  [BH]    Clinical Course User Index [BH] Henderly, Britni A, PA-C   MDM Rules/Calculators/A&P                           Patient here with questionable overdose.  Has hx of the same.  Given narcan by EMS.    Seen by and discussed with Dr. Wyvonnia Dusky.  Protecting his airway.    Patient is noted to be significantly tachycardic to the 170s to 190s.  Seems to be irregular, question A. fib with RVR.  Blood pressure is 130/79.  Will give 10 mg dose of Cardizem and repeat EKG.  Patient noted to be hypotensive at 95.3 degrees.  Question of this is DKA.  Blood sugars in the 200s.  VBG is pending.  Patient is responding to questions now.  6:23 AM Patient spontaneously converted to sinus tach.   Patient signed out at shift change to oncoming team, who will  continue care. Final Clinical Impression(s) / ED Diagnoses Final diagnoses:  Altered mental status, unspecified altered mental status type  COVID  Acute respiratory failure with hypoxia (HCC)  Lactic acidosis  Sepsis with acute hypoxic respiratory failure without septic Moore, due to unspecified organism ([place])  Elevated troponin  New onset a-fib (HCC)  Polysubstance abuse (HCC)  Elevated LFTs    Rx / DC Orders ED Discharge Orders     None        Montine Circle, PA-C 12/04/20 2208    Ezequiel Essex, MD 12/05/20 1527

## 2020-12-03 NOTE — Progress Notes (Signed)
*  PRELIMINARY RESULTS* Echocardiogram 2D Echocardiogram with Definity has been performed.  Neomia Dear RDCS 12/03/2020, 2:42 PM

## 2020-12-03 NOTE — ED Provider Notes (Addendum)
Care assumed at shift change from previous provider. See note for full HPI.  In summation here for AMS. Normal at 10 pm, yesterday. Wife awoke and patient slumped over cough at 3AM, diaphoretic. Attempted CPR as she thought patient was not breathing. Narcan given by EMS with improvement in mentation. Has Afib with RVR.   Hypothermic, tachycardic here. Sepsis protocol initiated with Broad ABX anf IVF. Hx of DM however does not appear to be in DKA, normal Bicarb, no anio gap. Does have acidosis on VBG though. Lactic 7.5  Plan on imaging and admit  Physical Exam  BP 140/90   Pulse (!) 112   Temp 98.7 F (37.1 C) (Oral)   Resp 15   Ht 5\' 8"  (1.727 m)   Wt 79.4 kg   SpO2 95%   BMI 26.61 kg/m   Physical Exam Vitals and nursing note reviewed.  Constitutional:      General: He is not in acute distress.    Appearance: He is well-developed. He is not ill-appearing, toxic-appearing or diaphoretic.     Comments: Awake however falls back to sleep  HENT:     Head: Normocephalic and atraumatic.     Nose: Nose normal.     Mouth/Throat:     Mouth: Mucous membranes are moist.  Eyes:     Pupils: Pupils are equal, round, and reactive to light.  Cardiovascular:     Rate and Rhythm: Regular rhythm. Tachycardia present.     Pulses: Normal pulses.          Radial pulses are 2+ on the right side and 2+ on the left side.       Dorsalis pedis pulses are 2+ on the right side and 2+ on the left side.     Heart sounds: Normal heart sounds.     Comments: Sinus tach Pulmonary:     Effort: Pulmonary effort is normal. No respiratory distress.     Breath sounds: Normal breath sounds.  Abdominal:     General: Bowel sounds are normal. There is no distension.     Palpations: Abdomen is soft.     Tenderness: There is no abdominal tenderness. There is no right CVA tenderness, left CVA tenderness or guarding.  Musculoskeletal:        General: No swelling, tenderness, deformity or signs of injury. Normal range  of motion.     Cervical back: Normal range of motion and neck supple.     Right lower leg: No edema.     Left lower leg: No edema.  Skin:    General: Skin is warm and dry.     Capillary Refill: Capillary refill takes less than 2 seconds.  Neurological:     General: No focal deficit present.     Mental Status: He is oriented to person, place, and time.     Comments: Awake, falls back to sleep Oriented x 3 Follows commands Equal strength Intact sensation CN 2-12 grossly intact    ED Course/Procedures   Clinical Course as of 12/03/20 1100  Fri Dec 03, 2020  Dec 05, 2020 AMS>>DKA, vs sepsis. Plan to add abx, admit [BH]  1048 Specimen Description: BLOOD RIGHT ANTECUBITAL Performed at [facility], [address], [place], [place]  [BH]    Clinical Course User Index [BH] Trevel Dillenbeck A, PA-C    .Critical Care  Date/Time: 12/03/2020 9:40 AM Performed by: 12/05/2020, PA-C Authorized by: Linwood Dibbles, PA-C   Critical care provider statement:  Critical care time (minutes):  45   Critical care was necessary to treat or prevent imminent or life-threatening deterioration of the following conditions:  Sepsis, respiratory failure, dehydration and metabolic crisis   Critical care was time spent personally by me on the following activities:  Discussions with consultants, evaluation of patient's response to treatment, examination of patient, ordering and performing treatments and interventions, ordering and review of laboratory studies, ordering and review of radiographic studies, pulse oximetry, re-evaluation of patient's condition, obtaining history from patient or surrogate and review of old charts Labs Reviewed  RESP PANEL BY RT-PCR (FLU A&B, COVID) ARPGX2 - Abnormal; Notable for the following components:      Result Value   SARS Coronavirus 2 by RT PCR POSITIVE (*)    All other components within normal limits  COMPREHENSIVE METABOLIC PANEL -  Abnormal; Notable for the following components:   Glucose, Bld 261 (*)    AST 88 (*)    ALT 48 (*)    All other components within normal limits  SALICYLATE LEVEL - Abnormal; Notable for the following components:   Salicylate Lvl <7.0 (*)    All other components within normal limits  ACETAMINOPHEN LEVEL - Abnormal; Notable for the following components:   Acetaminophen (Tylenol), Serum <10 (*)    All other components within normal limits  RAPID URINE DRUG SCREEN, HOSP PERFORMED - Abnormal; Notable for the following components:   Opiates POSITIVE (*)    Tetrahydrocannabinol POSITIVE (*)    All other components within normal limits  CBC WITH DIFFERENTIAL/PLATELET - Abnormal; Notable for the following components:   WBC 17.7 (*)    RBC 4.11 (*)    Hemoglobin 12.8 (*)    MCV 101.2 (*)    Neutro Abs 13.7 (*)    Monocytes Absolute 1.9 (*)    Abs Immature Granulocytes 0.09 (*)    All other components within normal limits  BLOOD GAS, VENOUS - Abnormal; Notable for the following components:   pH, Ven 7.183 (*)    pCO2, Ven 69.7 (*)    Acid-base deficit 4.1 (*)    All other components within normal limits  LACTIC ACID, PLASMA - Abnormal; Notable for the following components:   Lactic Acid, Venous 7.4 (*)    All other components within normal limits  CBG MONITORING, ED - Abnormal; Notable for the following components:   Glucose-Capillary 233 (*)    All other components within normal limits  TROPONIN I (HIGH SENSITIVITY) - Abnormal; Notable for the following components:   Troponin I (High Sensitivity) 35 (*)    All other components within normal limits  TROPONIN I (HIGH SENSITIVITY) - Abnormal; Notable for the following components:   Troponin I (High Sensitivity) 106 (*)    All other components within normal limits  CULTURE, BLOOD (SINGLE)  ETHANOL  LACTIC ACID, PLASMA  PROTIME-INR  APTT  URINALYSIS, ROUTINE W REFLEX MICROSCOPIC  LACTIC ACID, PLASMA  LACTIC ACID, PLASMA  CBG  MONITORING, ED   CT HEAD WO CONTRAST ( )  Result Date: 12/03/2020 CLINICAL DATA:  Syncope, recurrent LOC, unresponsive, s/p CPR, AMS EXAM: CT HEAD WITHOUT CONTRAST TECHNIQUE: Contiguous axial images were obtained from the base of the skull through the vertex without intravenous contrast. COMPARISON:  None. FINDINGS: Brain: No evidence of acute infarction, hemorrhage, hydrocephalus, extra-axial collection or mass lesion/mass effect. Vascular: No hyperdense vessel or unexpected calcification. Skull: Normal. Negative for fracture or focal lesion. Sinuses/Orbits: Complete opacification of the left maxillary sinus. Mild right maxillary sinus mucosal  thickening. Other: None. IMPRESSION: 1. No acute intracranial findings. 2. Complete opacification of the left maxillary sinus. Mild right maxillary sinus mucosal thickening. Electronically Signed   By: Duanne Guess D.O.   On: 12/03/2020 10:13   CT Angio Chest PE W and/or Wo Contrast  Result Date: 12/03/2020 CLINICAL DATA:  Syncope, unresponsive, CPR, COVID positive 12/03/2020 EXAM: CT ANGIOGRAPHY CHEST WITH CONTRAST TECHNIQUE: Multidetector CT imaging of the chest was performed using the standard protocol during bolus administration of intravenous contrast. Multiplanar CT image reconstructions and MIPs were obtained to evaluate the vascular anatomy. CONTRAST:  OMNIPAQUE IOHEXOL 350 MG/ML SOLN COMPARISON:  11/22/2007 FINDINGS: Cardiovascular: Minor aortic atherosclerosis. Pulsation artifact across the ascending aorta. Negative for aneurysm or dissection. No mediastinal hemorrhage or hematoma. Central pulmonary arteries appear patent. No significant filling defect or pulmonary embolus by CTA. Normal heart size. No pericardial effusion. Central venous structures are patent. No veno-occlusive process. Mediastinum/Nodes: No enlarged mediastinal, hilar, or axillary lymph nodes. Thyroid gland, trachea, and esophagus demonstrate no significant findings.  Lungs/Pleura: Bilateral lower lobe patchy and nodular peribronchovascular airspace opacities compatible with pneumonia and or aspiration. Scattered areas of ground-glass attenuation related to hypoventilatory changes. Trachea central airways are patent. No pleural abnormality, effusion or pneumothorax. Upper Abdomen: No acute abnormality. Musculoskeletal: No chest wall abnormality. No acute or significant osseous findings. Review of the MIP images confirms the above findings. IMPRESSION: Negative for significant acute pulmonary embolus by CTA. No other acute intrathoracic vascular finding. Patchy and nodular peribronchovascular airspace opacities compatible with pneumonia or aspiration. Additional bilateral scattered patchy ground-glass attenuation favored to be hypoventilatory changes. Aortic Atherosclerosis (ICD10-I70.0). Electronically Signed   By: Judie Petit.  Shick M.D.   On: 12/03/2020 09:59   CT ABDOMEN PELVIS W CONTRAST  Result Date: 12/03/2020 CLINICAL DATA:  Abdominal pain.  Unresponsive. EXAM: CT ABDOMEN AND PELVIS WITH CONTRAST TECHNIQUE: Multidetector CT imaging of the abdomen and pelvis was performed using the standard protocol following bolus administration of intravenous contrast. CONTRAST:  OMNIPAQUE IOHEXOL 350 MG/ML SOLN COMPARISON:  None. FINDINGS: Lower chest: Bibasilar airspace disease which may reflect bibasilar pneumonia versus aspiration pneumonia. Hepatobiliary: No focal liver abnormality is seen. No gallstones, gallbladder wall thickening, or biliary dilatation. Pancreas: Unremarkable. No pancreatic ductal dilatation or surrounding inflammatory changes. Spleen: Normal in size without focal abnormality. Adrenals/Urinary Tract: Adrenal glands are unremarkable. Kidneys are normal, without renal calculi, focal lesion, or hydronephrosis. Bladder is unremarkable. Stomach/Bowel: Severe gastric distension with a large air-fluid level. Small mild distal to the stomach is decompressed. Appendix  appears normal. No evidence of bowel wall thickening, distention, or inflammatory changes. Large amount of stool throughout the colon. Vascular/Lymphatic: No significant vascular findings are present. No enlarged abdominal or pelvic lymph nodes. Reproductive: Prostate is unremarkable. Other: No abdominal wall hernia or abnormality. No abdominopelvic ascites. Musculoskeletal: No acute or significant osseous findings. IMPRESSION: 1. Bibasilar airspace disease as can be seen with bibasilar pneumonia versus aspiration pneumonia. 2. Severe gastric distension with a large air-fluid level. Small mild distal to the stomach is decompressed. This appearance can be seen with gastroparesis versus gastric outlet obstruction or gastroenteritis. Electronically Signed   By: Elige Ko M.D.   On: 12/03/2020 10:16   DG Chest Port 1 View  Result Date: 12/03/2020 CLINICAL DATA:  Shortness of breath EXAM: PORTABLE CHEST 1 VIEW COMPARISON:  03/13/2017 FINDINGS: Normal heart size and mediastinal contours. There is no edema, consolidation, effusion, or pneumothorax. No acute osseous finding. IMPRESSION: Negative portable chest. Electronically Signed   By: Marja Kays  Watts M.D.   On: 12/03/2020 05:54    MDM  FU on labs, imaging and admit  COVID positive VBG pH 7.183, Bicarb 25.2 Trop 35>>> 106 Lactic acid 7.4>>0.8 Acetaminophen <10,  CBC leuk 17.7 CMP elevated LFT, glucose 261 EKG with Afib with RVR, repeat with sinus tachycardia UDS positive for Opiates and THC  Hypoxia req 3 L [place]. Cough intermittent over the last "few days" per wife in room.  Patient assessed. Oriented x 3 however does fall back to sleep. Follow commands.  CTA chest with PNA vs aspiration CT head without acute abnormality CT AP with gastric distension vs gastroparesis vs gastritis. Pt denies emesis, abd pain  Altered mental status likely multifactorial in etiology.  Low suspicion for DKA.  He was given antibiotics for possible sepsis.  Also  possible consider metabolic encephalopathy from COVID infection, polysubstance used on UDS.  CTA reassuring without evidence of PE, did show multifocal pneumonia, question aspiration.   CONSULT with Dr. Cristal Deer with Cardiology.  Discussed patient's case.  She feels his elevated troponin is likely due to demand.  She does recommend echo.  If he develops chest pain or worsening symptoms reconsult cardiology.  Normotensive, perfused distal extremities>>low suspicion for cardiogenic shock.  CONSULT with Dr. Ronaldo Miyamoto with [facility] who agrees to evaluate patient for admission. Recommends additional lactic acid  The patient appears reasonably stabilized for admission considering the current resources, flow, and capabilities available in the ED at this time, and I doubt any other [facility] requiring further screening and/or treatment in the ED prior to admission.            Tanyiah Laurich A, PA-C 12/03/20 1100    Pollyann Savoy, MD 12/03/20 [phone]

## 2020-12-04 DIAGNOSIS — A419 Sepsis, unspecified organism: Secondary | ICD-10-CM

## 2020-12-04 DIAGNOSIS — I10 Essential (primary) hypertension: Secondary | ICD-10-CM | POA: Diagnosis present

## 2020-12-04 DIAGNOSIS — R7401 Elevation of levels of liver transaminase levels: Secondary | ICD-10-CM

## 2020-12-04 DIAGNOSIS — I4891 Unspecified atrial fibrillation: Secondary | ICD-10-CM

## 2020-12-04 DIAGNOSIS — U071 COVID-19: Secondary | ICD-10-CM

## 2020-12-04 DIAGNOSIS — R778 Other specified abnormalities of plasma proteins: Secondary | ICD-10-CM | POA: Diagnosis present

## 2020-12-04 DIAGNOSIS — Z720 Tobacco use: Secondary | ICD-10-CM

## 2020-12-04 DIAGNOSIS — F1020 Alcohol dependence, uncomplicated: Secondary | ICD-10-CM

## 2020-12-04 DIAGNOSIS — J9601 Acute respiratory failure with hypoxia: Secondary | ICD-10-CM

## 2020-12-04 DIAGNOSIS — I429 Cardiomyopathy, unspecified: Secondary | ICD-10-CM

## 2020-12-04 DIAGNOSIS — F1910 Other psychoactive substance abuse, uncomplicated: Secondary | ICD-10-CM

## 2020-12-04 DIAGNOSIS — R6520 Severe sepsis without septic shock: Secondary | ICD-10-CM

## 2020-12-04 DIAGNOSIS — R931 Abnormal findings on diagnostic imaging of heart and coronary circulation: Secondary | ICD-10-CM | POA: Diagnosis present

## 2020-12-04 DIAGNOSIS — G9341 Metabolic encephalopathy: Secondary | ICD-10-CM

## 2020-12-04 DIAGNOSIS — J189 Pneumonia, unspecified organism: Secondary | ICD-10-CM | POA: Diagnosis present

## 2020-12-04 DIAGNOSIS — R7989 Other specified abnormal findings of blood chemistry: Secondary | ICD-10-CM

## 2020-12-04 LAB — COMPREHENSIVE METABOLIC PANEL
ALT: 33 U/L (ref 0–44)
AST: 39 U/L (ref 15–41)
Albumin: 3.3 g/dL — ABNORMAL LOW (ref 3.5–5.0)
Alkaline Phosphatase: 94 U/L (ref 38–126)
Anion gap: 7 (ref 5–15)
BUN: 18 mg/dL (ref 6–20)
CO2: 27 mmol/L (ref 22–32)
Calcium: 8.4 mg/dL — ABNORMAL LOW (ref 8.9–10.3)
Chloride: 106 mmol/L (ref 98–111)
Creatinine, Ser: 0.95 mg/dL (ref 0.61–1.24)
GFR, Estimated: >60 mL/min (ref >60)
Glucose, Bld: 75 mg/dL (ref 70–99)
Potassium: 4 mmol/L (ref 3.5–5.1)
Sodium: 140 mmol/L (ref 135–145)
Total Bilirubin: 0.4 mg/dL (ref 0.3–1.2)
Total Protein: 7.1 g/dL (ref 6.5–8.1)

## 2020-12-04 LAB — PROTIME-INR
INR: 1 (ref 0.8–1.2)
Prothrombin Time: 13.2 seconds (ref 11.4–15.2)

## 2020-12-04 LAB — D-DIMER, QUANTITATIVE: D-Dimer, Quant: 1.24 ug/mL-FEU — ABNORMAL HIGH (ref 0.00–0.50)

## 2020-12-04 LAB — CBC WITH DIFFERENTIAL/PLATELET
Abs Immature Granulocytes: 0.03 10*3/uL (ref 0.00–0.07)
Basophils Absolute: 0 10*3/uL (ref 0.0–0.1)
Basophils Relative: 0 %
Eosinophils Absolute: 0 10*3/uL (ref 0.0–0.5)
Eosinophils Relative: 0 %
HCT: 36.9 % — ABNORMAL LOW (ref 39.0–52.0)
Hemoglobin: 11.8 g/dL — ABNORMAL LOW (ref 13.0–17.0)
Immature Granulocytes: 0 %
Lymphocytes Relative: 6 %
Lymphs Abs: 0.7 10*3/uL (ref 0.7–4.0)
MCH: 31.1 pg (ref 26.0–34.0)
MCHC: 32 g/dL (ref 30.0–36.0)
MCV: 97.1 fL (ref 80.0–100.0)
Monocytes Absolute: 1.1 10*3/uL — ABNORMAL HIGH (ref 0.1–1.0)
Monocytes Relative: 9 %
Neutro Abs: 10.4 10*3/uL — ABNORMAL HIGH (ref 1.7–7.7)
Neutrophils Relative %: 85 %
Platelets: 292 10*3/uL (ref 150–400)
RBC: 3.8 MIL/uL — ABNORMAL LOW (ref 4.22–5.81)
RDW: 14 % (ref 11.5–15.5)
WBC: 12.3 10*3/uL — ABNORMAL HIGH (ref 4.0–10.5)
nRBC: 0 % (ref 0.0–0.2)

## 2020-12-04 LAB — PROCALCITONIN: Procalcitonin: 6.07 ng/mL

## 2020-12-04 LAB — HEPATITIS PANEL, ACUTE
HCV Ab: NONREACTIVE
Hep A IgM: NONREACTIVE
Hep B C IgM: NONREACTIVE
Hepatitis B Surface Ag: NONREACTIVE

## 2020-12-04 LAB — C-REACTIVE PROTEIN: CRP: 15 mg/dL — ABNORMAL HIGH (ref <1.0)

## 2020-12-04 LAB — TROPONIN I (HIGH SENSITIVITY)
Troponin I (High Sensitivity): 113 ng/L (ref <18)
Troponin I (High Sensitivity): 95 ng/L — ABNORMAL HIGH (ref <18)

## 2020-12-04 LAB — HIV ANTIBODY (ROUTINE TESTING W REFLEX): HIV Screen 4th Generation wRfx: NONREACTIVE

## 2020-12-04 LAB — GLUCOSE, CAPILLARY: Glucose-Capillary: 84 mg/dL (ref 70–99)

## 2020-12-04 LAB — FERRITIN: Ferritin: 136 ng/mL (ref 24–336)

## 2020-12-04 LAB — CORTISOL-AM, BLOOD: Cortisol - AM: 22.5 ug/dL (ref 6.7–22.6)

## 2020-12-04 MED ORDER — SENNA 8.6 MG PO TABS
1.0000 | ORAL_TABLET | Freq: Two times a day (BID) | ORAL | Status: DC
  Administered 2020-12-04 (×2): 8.6 mg via ORAL
  Filled 2020-12-04 (×3): qty 1

## 2020-12-04 MED ORDER — ASPIRIN EC 81 MG PO TBEC
81.0000 mg | DELAYED_RELEASE_TABLET | Freq: Every day | ORAL | Status: DC
  Administered 2020-12-04 – 2020-12-07 (×4): 81 mg via ORAL
  Filled 2020-12-04 (×4): qty 1

## 2020-12-04 MED ORDER — LOSARTAN POTASSIUM 25 MG PO TABS
25.0000 mg | ORAL_TABLET | Freq: Every day | ORAL | Status: DC
  Administered 2020-12-04 – 2020-12-07 (×4): 25 mg via ORAL
  Filled 2020-12-04 (×4): qty 1

## 2020-12-04 MED ORDER — LORATADINE 10 MG PO TABS
10.0000 mg | ORAL_TABLET | Freq: Every day | ORAL | Status: DC
  Administered 2020-12-04 – 2020-12-07 (×4): 10 mg via ORAL
  Filled 2020-12-04 (×4): qty 1

## 2020-12-04 MED ORDER — FLUTICASONE PROPIONATE 50 MCG/ACT NA SUSP
2.0000 | Freq: Every day | NASAL | Status: DC
  Administered 2020-12-04 – 2020-12-07 (×4): 2 via NASAL
  Filled 2020-12-04: qty 16

## 2020-12-04 MED ORDER — METOCLOPRAMIDE HCL 5 MG/ML IJ SOLN
5.0000 mg | Freq: Once | INTRAMUSCULAR | Status: AC
  Administered 2020-12-04: 5 mg via INTRAVENOUS
  Filled 2020-12-04: qty 2

## 2020-12-04 MED ORDER — SORBITOL 70 % SOLN
30.0000 mL | Freq: Once | Status: AC
  Administered 2020-12-04: 30 mL via ORAL
  Filled 2020-12-04: qty 30

## 2020-12-04 MED ORDER — POLYETHYLENE GLYCOL 3350 17 G PO PACK
17.0000 g | PACK | Freq: Two times a day (BID) | ORAL | Status: DC
  Administered 2020-12-04: 17 g via ORAL
  Filled 2020-12-04 (×2): qty 1

## 2020-12-04 MED ORDER — LINAGLIPTIN 5 MG PO TABS
5.0000 mg | ORAL_TABLET | Freq: Every day | ORAL | Status: DC
  Administered 2020-12-04 – 2020-12-05 (×2): 5 mg via ORAL
  Filled 2020-12-04 (×2): qty 1

## 2020-12-04 MED ORDER — PANTOPRAZOLE SODIUM 40 MG PO TBEC
40.0000 mg | DELAYED_RELEASE_TABLET | Freq: Every day | ORAL | Status: DC
  Administered 2020-12-04 – 2020-12-07 (×4): 40 mg via ORAL
  Filled 2020-12-04 (×4): qty 1

## 2020-12-04 MED ORDER — LABETALOL HCL 100 MG PO TABS
100.0000 mg | ORAL_TABLET | Freq: Two times a day (BID) | ORAL | Status: DC
  Administered 2020-12-04 – 2020-12-05 (×3): 100 mg via ORAL
  Filled 2020-12-04 (×3): qty 1

## 2020-12-04 MED ORDER — IPRATROPIUM-ALBUTEROL 20-100 MCG/ACT IN AERS
1.0000 | INHALATION_SPRAY | Freq: Three times a day (TID) | RESPIRATORY_TRACT | Status: DC
  Administered 2020-12-04 – 2020-12-07 (×10): 1 via RESPIRATORY_TRACT
  Filled 2020-12-04: qty 4

## 2020-12-04 NOTE — Consult Note (Addendum)
Cardiology Consultation:   Patient ID: [name] MRN: [id number]; DOB: Jan 21, 1976  Admit date: 12/03/2020 Date of Consult: 12/04/2020  PCP:  Pcp, No   [facility] Providers Cardiologist: new     Patient Profile:   [name] is a 45 y.o. male with a hx of diabetes hypertension admitted with altered mental status associated with respiratory arrest prompting CPR by his wife, Narcan by EMS, with response.  Presumed opiate overdose.  Also found to be in atrial fibrillation.  In ER>> hypothermic with elevated lactate, possible pneumonia, positive COVID sepsis protocol initiated, spontaneous reversion to sinus rhythm who is being seen 12/04/2020 for the evaluation of positive troponin at the request of Dr. Janee Morn. UDS positive for opiates and THC Echo EF 40-45% question apical aneurysm  History of Present Illness:   [name] admitted as above.  Denies ingestion of any drugs.  Has been feeling bad for a couple of days.  Currently denies chest pain.  No dyspnea at rest.  Currently on remdesivir and ceftriaxone  Denies drinking since 15-Aug-2016 when his son died as a passenger in a DUI accident   Past Medical History:  Diagnosis Date   CKD (chronic kidney disease), stage II    Diabetes mellitus without complication (HCC)    type 1   Polysubstance abuse (HCC)     Past Surgical History:  Procedure Laterality Date   DENTAL SURGERY        Inpatient Medications: Scheduled Meds:  vitamin C  500 mg Oral Daily   aspirin EC  81 mg Oral Daily   enoxaparin (LOVENOX) injection  40 mg Subcutaneous Q24H   fluticasone  2 spray Each Nare Daily   folic acid  1 mg Oral Daily   insulin aspart  0-15 Units Subcutaneous TID WC   insulin aspart  0-5 Units Subcutaneous QHS   insulin glargine-yfgn  15 Units Subcutaneous QHS   Ipratropium-Albuterol  1 puff Inhalation Q6H   labetalol  100 mg Oral BID   loratadine  10 mg Oral Daily   losartan  25 mg Oral Daily   multivitamin with  minerals  1 tablet Oral Daily   pantoprazole  40 mg Oral Daily   thiamine  100 mg Oral Daily   Or   thiamine  100 mg Intravenous Daily   zinc sulfate  220 mg Oral Daily   Continuous Infusions:  sodium chloride 100 mL/hr at 12/04/20 0620   azithromycin Stopped (12/03/20 1801)   cefTRIAXone (ROCEPHIN)  IV Stopped (12/03/20 1649)   remdesivir 100 mg in NS 100 mL        Allergies:    Allergies  Allergen Reactions   Shellfish Allergy Anaphylaxis    Social History:   Social History   Socioeconomic History   Marital status: Married    Spouse name: Not on file   Number of children: Not on file   Years of education: Not on file   Highest education level: Not on file  Occupational History   Not on file  Tobacco Use   Smoking status: Every Day    Packs/day: 0.50    Years: 20.00    Pack years: 10.00    Types: Cigarettes   Smokeless tobacco: Never  Vaping Use   Vaping Use: Never used  Substance and Sexual Activity   Alcohol use: Yes    Alcohol/week: 40.0 standard drinks    Types: 40 Cans of beer per week   Drug use: Yes    Frequency: 5.0 times  per week    Types: Marijuana, Cocaine   Sexual activity: Not on file  Other Topics Concern   Not on file  Social History Narrative   Not on file   Social Determinants of Health   Financial Resource Strain: Not on file  Food Insecurity: Not on file  Transportation Needs: Not on file  Physical Activity: Not on file  Stress: Not on file  Social Connections: Not on file  Intimate Partner Violence: Not on file    Family History:     Family History  Problem Relation Age of Onset   Arthritis Mother    Diabetes Mellitus II Father      ROS:  Please see the history of present illness.    All other ROS reviewed and negative.     Physical Exam/Data:   Vitals:   12/04/20 0415 12/04/20 0445 12/04/20 0515 12/04/20 0624  BP: (!) 153/86 (!) 161/86 (!) 146/88 (!) 150/91  Pulse: (!) 105 (!) 108 (!) 111 (!) 109  Resp: 13 17 16  19   Temp:    98.7 F (37.1 C)  TempSrc:      SpO2: 100% 94% 100% 99%  Weight:      Height:       No intake or output data in the 24 hours ending 12/04/20 0923 Last 3 Weights 12/03/2020 11/23/2020 03/10/2019  Weight (lbs) 175 lb 140 lb 130 lb 3.2 oz  Weight (kg) 79.379 kg 63.504 kg 59.058 kg     Body mass index is 26.61 kg/m.  General:  Well nourished, well developed, in no acute distress  HEENT: normal Lymph: no adenopathy Neck: no JVD Endocrine:  No thryomegaly Vascular: No carotid bruits; FA pulses 2+ bilaterally without bruits  Cardiac:  normal S1, S2; RRR; no murmur   Lungs:  clear to auscultation bilaterally, no wheezing, rhonchi or rales  Abd: soft, nontender, no hepatomegaly  Ext: no edema Musculoskeletal:  No deformities, BUE and BLE strength normal and equal Skin: warm and dry  Neuro:  CNs 2-12 intact, no focal abnormalities noted Psych:  Normal affect   EKG:  The EKG was personally reviewed and demonstrates: On arrival, atrial fibrillation with a rapid rate 09 42 8/19 sinus tachycardia otherwise normal Telemetry:  Telemetry was personally reviewed and demonstrates: Sinus tachycardia  Relevant CV Studies: (As above)   Laboratory Data:  High Sensitivity Troponin:   Recent Labs  Lab 12/03/20 0534 12/03/20 0850 12/04/20 0815  TROPONINIHS 35* 106* 113*     Chemistry Recent Labs  Lab 12/03/20 0534 12/03/20 1434 12/04/20 0540  NA 137  --  140  K 5.0  --  4.0  CL 101  --  106  CO2 22  --  27  GLUCOSE 261*  --  75  BUN 20  --  18  CREATININE 1.23 1.13 0.95  CALCIUM 8.9  --  8.4*  GFRNONAA >60 >60 >60  ANIONGAP 14  --  7    Recent Labs  Lab 12/03/20 0534 12/04/20 0540  PROT 7.9 7.1  ALBUMIN 4.0 3.3*  AST 88* 39  ALT 48* 33  ALKPHOS 117 94  BILITOT 0.4 0.4   Hematology Recent Labs  Lab 12/03/20 0534 12/03/20 1434 12/04/20 0540  WBC 17.7* 14.2* 12.3*  RBC 4.11* 3.83* 3.80*  HGB 12.8* 12.0* 11.8*  HCT 41.6 37.8* 36.9*  MCV 101.2* 98.7  97.1  MCH 31.1 31.3 31.1  MCHC 30.8 31.7 32.0  RDW 14.0 14.0 14.0  PLT 385 320 292  BNPNo results for input(s): BNP, PROBNP in the last 168 hours.  DDimer  Recent Labs  Lab 12/04/20 0540  DDIMER 1.24*     Radiology/Studies:  CT HEAD WO CONTRAST ( )  Result Date: 12/03/2020 CLINICAL DATA:  Syncope, recurrent LOC, unresponsive, s/p CPR, AMS EXAM: CT HEAD WITHOUT CONTRAST TECHNIQUE: Contiguous axial images were obtained from the base of the skull through the vertex without intravenous contrast. COMPARISON:  None. FINDINGS: Brain: No evidence of acute infarction, hemorrhage, hydrocephalus, extra-axial collection or mass lesion/mass effect. Vascular: No hyperdense vessel or unexpected calcification. Skull: Normal. Negative for fracture or focal lesion. Sinuses/Orbits: Complete opacification of the left maxillary sinus. Mild right maxillary sinus mucosal thickening. Other: None. IMPRESSION: 1. No acute intracranial findings. 2. Complete opacification of the left maxillary sinus. Mild right maxillary sinus mucosal thickening. Electronically Signed   By: Duanne Guess D.O.   On: 12/03/2020 10:13   CT Angio Chest PE W and/or Wo Contrast  Result Date: 12/03/2020 CLINICAL DATA:  Syncope, unresponsive, CPR, COVID positive 12/03/2020 EXAM: CT ANGIOGRAPHY CHEST WITH CONTRAST TECHNIQUE: Multidetector CT imaging of the chest was performed using the standard protocol during bolus administration of intravenous contrast. Multiplanar CT image reconstructions and MIPs were obtained to evaluate the vascular anatomy. CONTRAST:  OMNIPAQUE IOHEXOL 350 MG/ML SOLN COMPARISON:  11/22/2007 FINDINGS: Cardiovascular: Minor aortic atherosclerosis. Pulsation artifact across the ascending aorta. Negative for aneurysm or dissection. No mediastinal hemorrhage or hematoma. Central pulmonary arteries appear patent. No significant filling defect or pulmonary embolus by CTA. Normal heart size. No pericardial effusion.  Central venous structures are patent. No veno-occlusive process. Mediastinum/Nodes: No enlarged mediastinal, hilar, or axillary lymph nodes. Thyroid gland, trachea, and esophagus demonstrate no significant findings. Lungs/Pleura: Bilateral lower lobe patchy and nodular peribronchovascular airspace opacities compatible with pneumonia and or aspiration. Scattered areas of ground-glass attenuation related to hypoventilatory changes. Trachea central airways are patent. No pleural abnormality, effusion or pneumothorax. Upper Abdomen: No acute abnormality. Musculoskeletal: No chest wall abnormality. No acute or significant osseous findings. Review of the MIP images confirms the above findings. IMPRESSION: Negative for significant acute pulmonary embolus by CTA. No other acute intrathoracic vascular finding. Patchy and nodular peribronchovascular airspace opacities compatible with pneumonia or aspiration. Additional bilateral scattered patchy ground-glass attenuation favored to be hypoventilatory changes. Aortic Atherosclerosis (ICD10-I70.0). Electronically Signed   By: Judie Petit.  Shick M.D.   On: 12/03/2020 09:59   CT ABDOMEN PELVIS W CONTRAST  Result Date: 12/03/2020 CLINICAL DATA:  Abdominal pain.  Unresponsive. EXAM: CT ABDOMEN AND PELVIS WITH CONTRAST TECHNIQUE: Multidetector CT imaging of the abdomen and pelvis was performed using the standard protocol following bolus administration of intravenous contrast. CONTRAST:  OMNIPAQUE IOHEXOL 350 MG/ML SOLN COMPARISON:  None. FINDINGS: Lower chest: Bibasilar airspace disease which may reflect bibasilar pneumonia versus aspiration pneumonia. Hepatobiliary: No focal liver abnormality is seen. No gallstones, gallbladder wall thickening, or biliary dilatation. Pancreas: Unremarkable. No pancreatic ductal dilatation or surrounding inflammatory changes. Spleen: Normal in size without focal abnormality. Adrenals/Urinary Tract: Adrenal glands are unremarkable. Kidneys are  normal, without renal calculi, focal lesion, or hydronephrosis. Bladder is unremarkable. Stomach/Bowel: Severe gastric distension with a large air-fluid level. Small mild distal to the stomach is decompressed. Appendix appears normal. No evidence of bowel wall thickening, distention, or inflammatory changes. Large amount of stool throughout the colon. Vascular/Lymphatic: No significant vascular findings are present. No enlarged abdominal or pelvic lymph nodes. Reproductive: Prostate is unremarkable. Other: No abdominal wall hernia or abnormality. No abdominopelvic ascites. Musculoskeletal: No acute  or significant osseous findings. IMPRESSION: 1. Bibasilar airspace disease as can be seen with bibasilar pneumonia versus aspiration pneumonia. 2. Severe gastric distension with a large air-fluid level. Small mild distal to the stomach is decompressed. This appearance can be seen with gastroparesis versus gastric outlet obstruction or gastroenteritis. Electronically Signed   By: Elige Ko M.D.   On: 12/03/2020 10:16   DG Chest Port 1 View  Result Date: 12/03/2020 CLINICAL DATA:  Shortness of breath EXAM: PORTABLE CHEST 1 VIEW COMPARISON:  03/13/2017 FINDINGS: Normal heart size and mediastinal contours. There is no edema, consolidation, effusion, or pneumothorax. No acute osseous finding. IMPRESSION: Negative portable chest. Electronically Signed   By: Marnee Spring M.D.   On: 12/03/2020 05:54   ECHOCARDIOGRAM COMPLETE  Result Date: 12/03/2020    ECHOCARDIOGRAM REPORT   Patient Name:   [name] Date of Exam: 12/03/2020 Medical Rec #:  [id number]       Height:       68.0 in Accession #:    [id number]      Weight:       175.0 lb Date of Birth:  08-May-1975       BSA:          1.931 m Patient Age:    45 years        BP:           147/93 mmHg Patient Gender: M               HR:           110 bpm. Exam Location:  Inpatient Procedure: 2D Echo, Cardiac Doppler, Color Doppler and Intracardiac             Opacification Agent Indications:    Covid                 Elevated Troponins  History:        Patient has no prior history of Echocardiogram examinations.                 Risk Factors:Diabetes.  Sonographer:    Neomia Dear RDCS Referring Phys: [id number] Teddy Spike  Sonographer Comments: Suboptimal parasternal window and suboptimal apical window. IMPRESSIONS  1. Left ventricular ejection fraction, by estimation, is 40 to 45%. The left ventricle has mildly decreased function. The left ventricle demonstrates regional wall motion abnormalities with septal hypokinesis. The apex is not well-visualized, but I am concerned that an apical aneurysm may be present. Would consider cardiac MRI for full evaluation of LV. Left ventricular diastolic parameters are consistent with Grade II diastolic dysfunction (pseudonormalization).  2. Right ventricular systolic function is normal. The right ventricular size is normal. There is normal pulmonary artery systolic pressure. The estimated right ventricular systolic pressure is 31.9 mmHg.  3. The mitral valve is normal in structure. Trivial mitral valve regurgitation. No evidence of mitral stenosis.  4. The aortic valve is tricuspid. Aortic valve regurgitation is not visualized. No aortic stenosis is present.  5. The inferior vena cava is normal in size with greater than 50% respiratory variability, suggesting right atrial pressure of 3 mmHg. FINDINGS  Left Ventricle: Left ventricular ejection fraction, by estimation, is 40 to 45%. The left ventricle has mildly decreased function. The left ventricle demonstrates regional wall motion abnormalities. Definity contrast agent was given IV to delineate the left ventricular endocardial borders. The left ventricular internal cavity size was normal in size. There is no left ventricular hypertrophy. Left ventricular diastolic parameters are consistent  with Grade II diastolic dysfunction (pseudonormalization). Right Ventricle: The right  ventricular size is normal. No increase in right ventricular wall thickness. Right ventricular systolic function is normal. There is normal pulmonary artery systolic pressure. The tricuspid regurgitant velocity is 2.69 m/s, and  with an assumed right atrial pressure of 3 mmHg, the estimated right ventricular systolic pressure is 31.9 mmHg. Left Atrium: Left atrial size was normal in size. Right Atrium: Right atrial size was normal in size. Pericardium: There is no evidence of pericardial effusion. Mitral Valve: The mitral valve is normal in structure. Trivial mitral valve regurgitation. No evidence of mitral valve stenosis. Tricuspid Valve: The tricuspid valve is normal in structure. Tricuspid valve regurgitation is trivial. Aortic Valve: The aortic valve is tricuspid. Aortic valve regurgitation is not visualized. No aortic stenosis is present. Aortic valve mean gradient measures 4.0 mmHg. Aortic valve peak gradient measures 6.7 mmHg. Aortic valve area, by VTI measures 2.24 cm. Pulmonic Valve: The pulmonic valve was normal in structure. Pulmonic valve regurgitation is not visualized. Aorta: The aortic root is normal in size and structure. Venous: The inferior vena cava is normal in size with greater than 50% respiratory variability, suggesting right atrial pressure of 3 mmHg. IAS/Shunts: No atrial level shunt detected by color flow Doppler.  LEFT VENTRICLE PLAX 2D LVIDd:         5.10 cm  Diastology LVIDs:         4.10 cm  LV e' medial:    6.31 cm/s LV PW:         1.00 cm  LV E/e' medial:  10.6 LV IVS:        0.90 cm  LV e' lateral:   7.18 cm/s LVOT diam:     1.90 cm  LV E/e' lateral: 9.3 LV SV:         43 LV SV Index:   22 LVOT Area:     2.84 cm  RIGHT VENTRICLE RV Basal diam:  3.30 cm RV Mid diam:    2.00 cm RV S prime:     17.10 cm/s TAPSE (M-mode): 1.9 cm LEFT ATRIUM           Index       RIGHT ATRIUM           Index LA diam:      3.20 cm 1.66 cm/m  RA Area:     12.40 cm LA Vol (A2C): 43.9 ml 22.73 ml/m RA  Volume:   26.70 ml  13.83 ml/m LA Vol (A4C): 24.4 ml 12.64 ml/m  AORTIC VALVE                   PULMONIC VALVE AV Area (Vmax):    2.18 cm    PV Vmax:       1.36 m/s AV Area (Vmean):   2.08 cm    PV Vmean:      96.350 cm/s AV Area (VTI):     2.24 cm    PV VTI:        0.233 m AV Vmax:           129.00 cm/s PV Peak grad:  7.3 mmHg AV Vmean:          87.300 cm/s PV Mean grad:  4.0 mmHg AV VTI:            0.190 m AV Peak Grad:      6.7 mmHg AV Mean Grad:      4.0 mmHg LVOT Vmax:  99.00 cm/s LVOT Vmean:        64.000 cm/s LVOT VTI:          0.150 m LVOT/AV VTI ratio: 0.79  AORTA Ao Root diam: 2.50 cm Ao Asc diam:  2.30 cm MITRAL VALVE               TRICUSPID VALVE MV Area (PHT): 6.27 cm    TR Peak grad:   28.9 mmHg MV Decel Time: 121 msec    TR Vmax:        269.00 cm/s MV E velocity: 66.70 cm/s MV A velocity: 60.70 cm/s  SHUNTS MV E/A ratio:  1.10        Systemic VTI:  0.15 m                            Systemic Diam: 1.90 cm Dalton McleanMD Electronically signed by Wilfred Lacyalton McleanMD Signature Date/Time: 12/03/2020/2:59:02 PM    Final    US Abdomen Limited RUQ (LIVER/GB)  Result Date: 12/03/2020 CLINICAL DATA:  Abnormal LFTs EXAM: ULTRASOUND ABDOMEN LIMITED RIGHT UPPER QUADRANT COMPARISON:  Same day CT abdomen/pelvis FINDINGS: Gallbladder: No gallstones or wall thickening visualized. No sonographic Murphy sign noted by sonographer. Common bile duct: Diameter: 5 mm Liver: No focal lesion identified. Within normal limits in parenchymal echogenicity. Portal vein is patent on color Doppler imaging with normal direction of blood flow towards the liver. Other: None. IMPRESSION: Unremarkable right upper quadrant ultrasound. Electronically Signed   By: Lesia HausenPeter  Noone M.D.   On: 12/03/2020 13:34     Assessment and Plan:   Elevated troponin-flat trajectory  Cardiomyopathy-mild (EF 40-45%) question aneurysm  Opiate overdose with respiratory arrest denies indgestion  Pneumonia-question multifactorial-COVID  question aspiration  Diabetes with hyperglycemia history of noncompliance  Hypertension  Polysubstance abuse  Aortic atherosclerosis  Patient's minimally elevated troponin is no immediate concern.  Demand certainly contributed with the rapid atrial fibrillation and pneumonia.  The echocardiogram revealed no other concerns.  I suggested by Dr. DM on his report, would undertake cMRI as an outpatient.  With elevated blood pressure and heart rate continue beta-blockers and ARB's for cardiomyopathy as well as renal protection i.e. labetalol and losartan .   Recommend statins and aspirin ongoingly for vascular disease, we will begin statins prior to discharge   Will see again Monday  call for questions    Risk Assessment/Risk Scores:           For questions or updates, please contact [facility] Please consult [email or web address] for contact info under    Signed, Sherryl MangesSteven Michale Emmerich, MD  12/04/2020 9:23 AM

## 2020-12-04 NOTE — Progress Notes (Signed)
PROGRESS NOTE    [name]  MWN:[id number] DOB: Apr 10, 1976 DOA: 12/03/2020 PCP: Pcp, No (Confirm with patient/family/NH records and if not entered, this HAS to be entered at [facility] point of entry. "No PCP" if truly none.)   Chief Complaint  Patient presents with   Altered Mental Status    Brief Narrative: 45 year old gentleman history of type 1 diabetes, depression, hypertension, prior history of polysubstance abuse presented to the ED with altered mental status noted to have been found unresponsive around 3 AM with substance treatment from his mouth.  Wife started compressions and called EMS.  When EMS arrived patient was administered Narcan and started to wake up.  Patient transported to the ED found to be hypothermic with tachycardia, elevated lactic acid level, CT angiogram chest with concern for pneumonia.  COVID-19 PCR positive.  Sepsis protocol initiated.  Cardiac enzymes elevated.  2D echo noted to have a depressed EF with wall motion abnormalities.  Patient admitted placed on IV antibiotics, cardiology consulted.   Assessment & Plan:   Principal Problem:   Acute metabolic encephalopathy Active Problems:   Sepsis (HCC)   Substance abuse (HCC)   Alcohol dependence (HCC)   Tobacco abuse   COVID-19   Pneumonia   Transaminitis   Essential hypertension   Abnormal echocardiogram   Elevated troponin  #1 acute metabolic encephalopathy/polysubstance abuse/overdose on opioid -Patient admitted to progressive care unit as he had presented with unresponsiveness which improved after administration of Narcan when EMS presented to patient's home. -Patient denies any opioid use.  UDS done positive for opiates and THC. -Patient counseled on further drug use. -Head CT done negative for any acute abnormalities. -CT angiogram chest done negative for PE however concerning for bibasilar infiltrate/pneumonia. -Clinical improvement and likely close to baseline. -Continue empiric IV  antibiotics. -Supportive care.  2.  Sepsis secondary to pneumonia, POA -Patient presented with criteria for sepsis with a sinus tachycardia, tachypnea, elevated lactic acid level, hypothermia. -CT angiogram chest done negative for PE however concerning for bibasilar pneumonia. -CT abdomen and pelvis consistent with bibasilar pneumonia. -Urinalysis nitrite negative, leukocytes negative. -Blood cultures pending with no growth to date. -Afebrile. -Concern for possible aspiration. -SLP evaluation pending. -Continue IV Rocephin, IV azithromycin. -Placed on Flonase, Combivent, Claritin, PPI. -Supportive care.  3.  Elevated troponin/abnormal 2D echo/cardiomyopathy with ? apical aneurysm -Patient noted to have elevated troponin felt likely secondary due to demand ischemia. -Patient denies any chest pain. -2D echo done with EF of 40 to 45%, WMA, grade 2 diastolic dysfunction, concern for apical aneurysm. -Check a fasting lipid panel. -Patient placed on aspirin, ARB, beta-blocker. -Cardiology consulted and currently recommending outpatient see MRI, addition of statin prior to discharge. -Polysubstance cessation stressed to patient. -We will need close outpatient follow-up with cardiology.  4.  Hypertension -We will start patient on ARB, labetalol.  5.  COVID-19 infection -Improvement with respiratory status currently with sats of 100% on room air. -Continue course of IV remdesivir, Combivent, vitamin C, zinc. -Placed on scheduled Combivent, Claritin, PPI, Flonase. -Continue airborne isolation.  6.  Poorly controlled diabetes mellitus type 2 -Hemoglobin A1c 12.4 (11/23/2020) -CBG 84 this morning. -Placed on Tradjenta. -Continue Semglee 15 units daily, SSI. -Diabetes coordinator following.  7.  Constipation -Place on MiraLAX twice daily, Senokot-S twice daily. -Sorbitol p.o. x1. -Reglan 5 mg IV x1.  8.  History of alcohol abuse -Patient with no signs of significant alcohol  withdrawal. -Continue the Ativan CIWA protocol. -Continue thiamine, folic acid, multivitamin.  9.  Transaminitis -  Likely secondary to problem #1. -Acute hepatitis panel negative. -Right upper quadrant abdominal ultrasound unremarkable. -LFTs trending down. -Outpatient follow-up.  10.  Tobacco abuse -Tobacco cessation stressed to patient.  11.  ?Transient A. fib -Per cardiology it was noted that patient had a transient A. fib when EMS got there and subsequently now in normal sinus rhythm. -Patient started on labetalol. -Per cardiology.      DVT prophylaxis: Lovenox Code Status: Full Family Communication: Updated patient and wife at bedside. Disposition:   Status is: Inpatient  Remains inpatient appropriate because:Inpatient level of care appropriate due to severity of illness  Dispo: The patient is from: Home              Anticipated d/c is to: Home              Patient currently is not medically stable to d/c.   Difficult to place patient No       Consultants:  Cardiology: Dr. Graciela Husbands 12/04/2020  Procedures:  CT head 12/03/2020 CT angiogram chest 12/03/2020 CT abdomen and pelvis 12/03/2020 Chest x-ray 12/03/2020 Abdominal ultrasound 12/03/2020 2D echo 12/03/2020  Antimicrobials:  IV remdesivir 12/03/2020>>>> IV Rocephin 12/03/2020>>> IV azithromycin 12/03/2020>>>   Subjective: No chest pain.  No shortness of breath.  Some intermittent abdominal upper discomfort.  Patient adamantly denies any use of cocaine.  Wife at bedside.  Overall states feeling better than on admission.  Patient with some complaints of constipation.  Objective: Vitals:   12/04/20 0515 12/04/20 0624 12/04/20 0951 12/04/20 1644  BP: (!) 146/88 (!) 150/91 (!) 146/91 (!) 144/84  Pulse: (!) 111 (!) 109 (!) 107 88  Resp: 16 19  18   Temp:  98.7 F (37.1 C) 98.5 F (36.9 C) 98.2 F (36.8 C)  TempSrc:   Oral Oral  SpO2: 100% 99% 100% 100%  Weight:      Height:        Intake/Output Summary  (Last 24 hours) at 12/04/2020 1854 Last data filed at 12/04/2020 1700 Gross per 24 hour  Intake --  Output 920 ml  Net -920 ml   Filed Weights   12/03/20 0541  Weight: 79.4 kg    Examination:  General exam: Appears calm and comfortable  Respiratory system: Clear to auscultation. Respiratory effort normal. Cardiovascular system: S1 & S2 heard, RRR. No JVD, murmurs, rubs, gallops or clicks. No pedal edema. Gastrointestinal system: Abdomen is nondistended, soft and nontender. No organomegaly or masses felt. Normal bowel sounds heard. Central nervous system: Alert and oriented. No focal neurological deficits. Extremities: Symmetric 5 x 5 power. Skin: No rashes, lesions or ulcers Psychiatry: Judgement and insight appear normal. Mood & affect appropriate.     Data Reviewed: I have personally reviewed following labs and imaging studies  CBC: Recent Labs  Lab 12/03/20 0534 12/03/20 1434 12/04/20 0540  WBC 17.7* 14.2* 12.3*  NEUTROABS 13.7*  --  10.4*  HGB 12.8* 12.0* 11.8*  HCT 41.6 37.8* 36.9*  MCV 101.2* 98.7 97.1  PLT 385 320 292    Basic Metabolic Panel: Recent Labs  Lab 12/03/20 0534 12/03/20 1434 12/04/20 0540  NA 137  --  140  K 5.0  --  4.0  CL 101  --  106  CO2 22  --  27  GLUCOSE 261*  --  75  BUN 20  --  18  CREATININE 1.23 1.13 0.95  CALCIUM 8.9  --  8.4*    GFR: Estimated Creatinine Clearance: 93 mL/min (by C-G formula based on  SCr of 0.95 mg/dL).  Liver Function Tests: Recent Labs  Lab 12/03/20 0534 12/04/20 0540  AST 88* 39  ALT 48* 33  ALKPHOS 117 94  BILITOT 0.4 0.4  PROT 7.9 7.1  ALBUMIN 4.0 3.3*    CBG: Recent Labs  Lab 12/03/20 0541 12/03/20 1207 12/03/20 1632 12/04/20 0727  GLUCAP 233* 383* 215* 84     Recent Results (from the past 240 hour(s))  Blood culture (routine single)     Status: None (Preliminary result)   Collection Time: 12/03/20  5:59 AM   Specimen: BLOOD  Result Value Ref Range Status   Specimen  Description   Final    BLOOD RIGHT ANTECUBITAL Performed at [facility], [address], [place], [place] [address]    Special Requests   Final    BOTTLES DRAWN AEROBIC AND ANAEROBIC Blood Culture adequate volume Performed at [facility], [address], [place], [place] [address]    Culture   Final    NO GROWTH 1 DAY Performed at [facility], [address], [place], [place] [address]    Report Status PENDING  Incomplete  Resp Panel by RT-PCR (Flu A&B, Covid) Nasopharyngeal Swab     Status: Abnormal   Collection Time: 12/03/20  6:30 AM   Specimen: Nasopharyngeal Swab; Nasopharyngeal(NP) swabs in vial transport medium  Result Value Ref Range Status   SARS Coronavirus 2 by RT PCR POSITIVE (A) NEGATIVE Final    Comment: RESULT CALLED TO, READ BACK BY AND VERIFIED WITH: GARRISON,G. RN @0817  ON 8.19.2022 BY NMCCOY (NOTE) SARS-CoV-2 target nucleic acids are DETECTED.  The SARS-CoV-2 RNA is generally detectable in upper respiratory specimens during the acute phase of infection. Positive results are indicative of the presence of the identified virus, but do not rule out bacterial infection or co-infection with other pathogens not detected by the test. Clinical correlation with patient history and other diagnostic information is necessary to determine patient infection status. The expected result is Negative.  Fact Sheet for Patients: 8.21.2022  Fact Sheet for Healthcare Providers: BloggerCourse.com  This test is not yet approved or cleared by the [employer] FDA and  has been authorized for detection and/or diagnosis of SARS-CoV-2 by FDA under an Emergency Use Authorization (EUA).  This EUA will remain in effect (meaning this t est can be used) for the duration of  the COVID-19 declaration under Section 564(b)(1) of the Act, 21 U.S.C. section 360bbb-3(b)(1), unless the  authorization is terminated or revoked sooner.     Influenza A by PCR NEGATIVE NEGATIVE Final   Influenza B by PCR NEGATIVE NEGATIVE Final    Comment: (NOTE) The Xpert Xpress SARS-CoV-2/FLU/RSV plus assay is intended as an aid in the diagnosis of influenza from Nasopharyngeal swab specimens and should not be used as a sole basis for treatment. Nasal washings and aspirates are unacceptable for Xpert Xpress SARS-CoV-2/FLU/RSV testing.  Fact Sheet for Patients: [place]  Fact Sheet for Healthcare Providers: BloggerCourse.com  This test is not yet approved or cleared by the [employer]ious[employer]ker.[employer] FDA and has been authorized for detection and/or diagnosis of SARS-CoV-2 by FDA under an Emergency Use Authorization (EUA). This EUA will remain in effect (meaning this test can be used) for the duration of the COVID-19 declaration under Section 564(b)(1) of the Act, 21 U.S.C. section 360bbb-3(b)(1), unless the authorization is terminated or revoked.  Performed at [facility], [address], [place], [place]  Radiology Studies: CT HEAD WO CONTRAST ( )  Result Date: 12/03/2020 CLINICAL DATA:  Syncope, recurrent LOC, unresponsive, s/p CPR, AMS EXAM: CT HEAD WITHOUT CONTRAST TECHNIQUE: Contiguous axial images were obtained from the base of the skull through the vertex without intravenous contrast. COMPARISON:  None. FINDINGS: Brain: No evidence of acute infarction, hemorrhage, hydrocephalus, extra-axial collection or mass lesion/mass effect. Vascular: No hyperdense vessel or unexpected calcification. Skull: Normal. Negative for fracture or focal lesion. Sinuses/Orbits: Complete opacification of the left maxillary sinus. Mild right maxillary sinus mucosal thickening. Other: None. IMPRESSION: 1. No acute intracranial findings. 2. Complete opacification of the left maxillary sinus. Mild right maxillary sinus  mucosal thickening. Electronically Signed   By: Duanne Guess D.O.   On: 12/03/2020 10:13   CT Angio Chest PE W and/or Wo Contrast  Result Date: 12/03/2020 CLINICAL DATA:  Syncope, unresponsive, CPR, COVID positive 12/03/2020 EXAM: CT ANGIOGRAPHY CHEST WITH CONTRAST TECHNIQUE: Multidetector CT imaging of the chest was performed using the standard protocol during bolus administration of intravenous contrast. Multiplanar CT image reconstructions and MIPs were obtained to evaluate the vascular anatomy. CONTRAST:  OMNIPAQUE IOHEXOL 350 MG/ML SOLN COMPARISON:  11/22/2007 FINDINGS: Cardiovascular: Minor aortic atherosclerosis. Pulsation artifact across the ascending aorta. Negative for aneurysm or dissection. No mediastinal hemorrhage or hematoma. Central pulmonary arteries appear patent. No significant filling defect or pulmonary embolus by CTA. Normal heart size. No pericardial effusion. Central venous structures are patent. No veno-occlusive process. Mediastinum/Nodes: No enlarged mediastinal, hilar, or axillary lymph nodes. Thyroid gland, trachea, and esophagus demonstrate no significant findings. Lungs/Pleura: Bilateral lower lobe patchy and nodular peribronchovascular airspace opacities compatible with pneumonia and or aspiration. Scattered areas of ground-glass attenuation related to hypoventilatory changes. Trachea central airways are patent. No pleural abnormality, effusion or pneumothorax. Upper Abdomen: No acute abnormality. Musculoskeletal: No chest wall abnormality. No acute or significant osseous findings. Review of the MIP images confirms the above findings. IMPRESSION: Negative for significant acute pulmonary embolus by CTA. No other acute intrathoracic vascular finding. Patchy and nodular peribronchovascular airspace opacities compatible with pneumonia or aspiration. Additional bilateral scattered patchy ground-glass attenuation favored to be hypoventilatory changes. Aortic Atherosclerosis  (ICD10-I70.0). Electronically Signed   By: Judie Petit.  Shick M.D.   On: 12/03/2020 09:59   CT ABDOMEN PELVIS W CONTRAST  Result Date: 12/03/2020 CLINICAL DATA:  Abdominal pain.  Unresponsive. EXAM: CT ABDOMEN AND PELVIS WITH CONTRAST TECHNIQUE: Multidetector CT imaging of the abdomen and pelvis was performed using the standard protocol following bolus administration of intravenous contrast. CONTRAST:  OMNIPAQUE IOHEXOL 350 MG/ML SOLN COMPARISON:  None. FINDINGS: Lower chest: Bibasilar airspace disease which may reflect bibasilar pneumonia versus aspiration pneumonia. Hepatobiliary: No focal liver abnormality is seen. No gallstones, gallbladder wall thickening, or biliary dilatation. Pancreas: Unremarkable. No pancreatic ductal dilatation or surrounding inflammatory changes. Spleen: Normal in size without focal abnormality. Adrenals/Urinary Tract: Adrenal glands are unremarkable. Kidneys are normal, without renal calculi, focal lesion, or hydronephrosis. Bladder is unremarkable. Stomach/Bowel: Severe gastric distension with a large air-fluid level. Small mild distal to the stomach is decompressed. Appendix appears normal. No evidence of bowel wall thickening, distention, or inflammatory changes. Large amount of stool throughout the colon. Vascular/Lymphatic: No significant vascular findings are present. No enlarged abdominal or pelvic lymph nodes. Reproductive: Prostate is unremarkable. Other: No abdominal wall hernia or abnormality. No abdominopelvic ascites. Musculoskeletal: No acute or significant osseous findings. IMPRESSION: 1. Bibasilar airspace disease as can be seen with bibasilar pneumonia versus aspiration pneumonia. 2. Severe gastric distension with a large air-fluid  level. Small mild distal to the stomach is decompressed. This appearance can be seen with gastroparesis versus gastric outlet obstruction or gastroenteritis. Electronically Signed   By: Elige Ko M.D.   On: 12/03/2020 10:16   DG Chest  Port 1 View  Result Date: 12/03/2020 CLINICAL DATA:  Shortness of breath EXAM: PORTABLE CHEST 1 VIEW COMPARISON:  03/13/2017 FINDINGS: Normal heart size and mediastinal contours. There is no edema, consolidation, effusion, or pneumothorax. No acute osseous finding. IMPRESSION: Negative portable chest. Electronically Signed   By: Marnee Spring M.D.   On: 12/03/2020 05:54   ECHOCARDIOGRAM COMPLETE  Result Date: 12/03/2020    ECHOCARDIOGRAM REPORT   Patient Name:   MEILECH VIRTS Date of Exam: 12/03/2020 Medical Rec #:  [id number]       Height:       68.0 in Accession #:    [id number]      Weight:       175.0 lb Date of Birth:  1975-07-24       BSA:          1.931 m Patient Age:    45 years        BP:           147/93 mmHg Patient Gender: M               HR:           110 bpm. Exam Location:  Inpatient Procedure: 2D Echo, Cardiac Doppler, Color Doppler and Intracardiac            Opacification Agent Indications:    Covid                 Elevated Troponins  History:        Patient has no prior history of Echocardiogram examinations.                 Risk Factors:Diabetes.  Sonographer:    Neomia Dear RDCS Referring Phys: [id number] Teddy Spike  Sonographer Comments: Suboptimal parasternal window and suboptimal apical window. IMPRESSIONS  1. Left ventricular ejection fraction, by estimation, is 40 to 45%. The left ventricle has mildly decreased function. The left ventricle demonstrates regional wall motion abnormalities with septal hypokinesis. The apex is not well-visualized, but I am concerned that an apical aneurysm may be present. Would consider cardiac MRI for full evaluation of LV. Left ventricular diastolic parameters are consistent with Grade II diastolic dysfunction (pseudonormalization).  2. Right ventricular systolic function is normal. The right ventricular size is normal. There is normal pulmonary artery systolic pressure. The estimated right ventricular systolic pressure is 31.9 mmHg.  3. The mitral  valve is normal in structure. Trivial mitral valve regurgitation. No evidence of mitral stenosis.  4. The aortic valve is tricuspid. Aortic valve regurgitation is not visualized. No aortic stenosis is present.  5. The inferior vena cava is normal in size with greater than 50% respiratory variability, suggesting right atrial pressure of 3 mmHg. FINDINGS  Left Ventricle: Left ventricular ejection fraction, by estimation, is 40 to 45%. The left ventricle has mildly decreased function. The left ventricle demonstrates regional wall motion abnormalities. Definity contrast agent was given IV to delineate the left ventricular endocardial borders. The left ventricular internal cavity size was normal in size. There is no left ventricular hypertrophy. Left ventricular diastolic parameters are consistent with Grade II diastolic dysfunction (pseudonormalization). Right Ventricle: The right ventricular size is normal. No increase in right ventricular wall thickness. Right ventricular systolic function is  normal. There is normal pulmonary artery systolic pressure. The tricuspid regurgitant velocity is 2.69 m/s, and  with an assumed right atrial pressure of 3 mmHg, the estimated right ventricular systolic pressure is 31.9 mmHg. Left Atrium: Left atrial size was normal in size. Right Atrium: Right atrial size was normal in size. Pericardium: There is no evidence of pericardial effusion. Mitral Valve: The mitral valve is normal in structure. Trivial mitral valve regurgitation. No evidence of mitral valve stenosis. Tricuspid Valve: The tricuspid valve is normal in structure. Tricuspid valve regurgitation is trivial. Aortic Valve: The aortic valve is tricuspid. Aortic valve regurgitation is not visualized. No aortic stenosis is present. Aortic valve mean gradient measures 4.0 mmHg. Aortic valve peak gradient measures 6.7 mmHg. Aortic valve area, by VTI measures 2.24 cm. Pulmonic Valve: The pulmonic valve was normal in structure.  Pulmonic valve regurgitation is not visualized. Aorta: The aortic root is normal in size and structure. Venous: The inferior vena cava is normal in size with greater than 50% respiratory variability, suggesting right atrial pressure of 3 mmHg. IAS/Shunts: No atrial level shunt detected by color flow Doppler.  LEFT VENTRICLE PLAX 2D LVIDd:         5.10 cm  Diastology LVIDs:         4.10 cm  LV e' medial:    6.31 cm/s LV PW:         1.00 cm  LV E/e' medial:  10.6 LV IVS:        0.90 cm  LV e' lateral:   7.18 cm/s LVOT diam:     1.90 cm  LV E/e' lateral: 9.3 LV SV:         43 LV SV Index:   22 LVOT Area:     2.84 cm  RIGHT VENTRICLE RV Basal diam:  3.30 cm RV Mid diam:    2.00 cm RV S prime:     17.10 cm/s TAPSE (M-mode): 1.9 cm LEFT ATRIUM           Index       RIGHT ATRIUM           Index LA diam:      3.20 cm 1.66 cm/m  RA Area:     12.40 cm LA Vol (A2C): 43.9 ml 22.73 ml/m RA Volume:   26.70 ml  13.83 ml/m LA Vol (A4C): 24.4 ml 12.64 ml/m  AORTIC VALVE                   PULMONIC VALVE AV Area (Vmax):    2.18 cm    PV Vmax:       1.36 m/s AV Area (Vmean):   2.08 cm    PV Vmean:      96.350 cm/s AV Area (VTI):     2.24 cm    PV VTI:        0.233 m AV Vmax:           129.00 cm/s PV Peak grad:  7.3 mmHg AV Vmean:          87.300 cm/s PV Mean grad:  4.0 mmHg AV VTI:            0.190 m AV Peak Grad:      6.7 mmHg AV Mean Grad:      4.0 mmHg LVOT Vmax:         99.00 cm/s LVOT Vmean:        64.000 cm/s LVOT VTI:  0.150 m LVOT/AV VTI ratio: 0.79  AORTA Ao Root diam: 2.50 cm Ao Asc diam:  2.30 cm MITRAL VALVE               TRICUSPID VALVE MV Area (PHT): 6.27 cm    TR Peak grad:   28.9 mmHg MV Decel Time: 121 msec    TR Vmax:        269.00 cm/s MV E velocity: 66.70 cm/s MV A velocity: 60.70 cm/s  SHUNTS MV E/A ratio:  1.10        Systemic VTI:  0.15 m                            Systemic Diam: 1.90 cm Dalton McleanMD Electronically signed by Wilfred Lacy Signature Date/Time: 12/03/2020/2:59:02 PM    Final     US Abdomen Limited RUQ (LIVER/GB)  Result Date: 12/03/2020 CLINICAL DATA:  Abnormal LFTs EXAM: ULTRASOUND ABDOMEN LIMITED RIGHT UPPER QUADRANT COMPARISON:  Same day CT abdomen/pelvis FINDINGS: Gallbladder: No gallstones or wall thickening visualized. No sonographic Murphy sign noted by sonographer. Common bile duct: Diameter: 5 mm Liver: No focal lesion identified. Within normal limits in parenchymal echogenicity. Portal vein is patent on color Doppler imaging with normal direction of blood flow towards the liver. Other: None. IMPRESSION: Unremarkable right upper quadrant ultrasound. Electronically Signed   By: Lesia Hausen M.D.   On: 12/03/2020 13:34        Scheduled Meds:  vitamin C  500 mg Oral Daily   aspirin EC  81 mg Oral Daily   enoxaparin (LOVENOX) injection  40 mg Subcutaneous Q24H   fluticasone  2 spray Each Nare Daily   folic acid  1 mg Oral Daily   insulin aspart  0-15 Units Subcutaneous TID WC   insulin aspart  0-5 Units Subcutaneous QHS   insulin glargine-yfgn  15 Units Subcutaneous QHS   Ipratropium-Albuterol  1 puff Inhalation TID   labetalol  100 mg Oral BID   linagliptin  5 mg Oral Daily   loratadine  10 mg Oral Daily   losartan  25 mg Oral Daily   metoCLOPramide (REGLAN) injection  5 mg Intravenous Once   multivitamin with minerals  1 tablet Oral Daily   pantoprazole  40 mg Oral Daily   polyethylene glycol  17 g Oral BID   senna  1 tablet Oral BID   thiamine  100 mg Oral Daily   Or   thiamine  100 mg Intravenous Daily   zinc sulfate  220 mg Oral Daily   Continuous Infusions:  sodium chloride 100 mL/hr at 12/04/20 5789   azithromycin 500 mg (12/04/20 1805)   cefTRIAXone (ROCEPHIN)  IV 2 g (12/04/20 1616)   remdesivir 100 mg in NS 100 mL 100 mg (12/04/20 1022)     LOS: 1 day    Time spent: 40 minutes    Ramiro Harvest, MD [facility]   To contact the attending provider between 7A-7P or the covering provider during after hours 7P-7A,  please log into the web site [email or web address] and access using universal [place] password for that web site. If you do not have the password, please call the hospital operator.  12/04/2020, 6:54 PM

## 2020-12-04 NOTE — ED Notes (Signed)
ED TO INPATIENT HANDOFF REPORT  Name/Age/Gender [name] 45 y.o. male  Code Status    Code Status Orders  (From admission, onward)         Start     Ordered   12/03/20 1229  Full code  Continuous        12/03/20 1228        Code Status History    Date Active Date Inactive Code Status Order ID Comments User Context   03/13/2017 0222 03/14/2017 1809 Full Code [id number]  Isaac HarpNiu, Xilin, MD ED   12/28/2013 0019 12/30/2013 1714 Full Code [id number]  Isaac OxfordPatel, Pranav, MD ED      Home/SNF/Other Home  Chief Complaint COVID-19 [U07.1]  Level of Care/Admitting Diagnosis ED Disposition    ED Disposition  Admit   Condition  --   Comment  [facility] [[id number]]  Level of Care: Progressive [102]  Admit to Progressive based on following criteria: MULTISYSTEM THREATS such as stable sepsis, metabolic/electrolyte imbalance with or without encephalopathy that is responding to early treatment.  [facility] or [name] if equivalent level of care is available:: No  Covid Evaluation: Confirmed COVID Positive  Diagnosis: COVID-19 [[id number]][[phone]]  Admitting Physician: Teddy SpikeKYLE, TYRONE A [[id number]]  Attending Physician: Teddy SpikeKYLE, TYRONE A [[id number]]  Estimated length of stay: past midnight tomorrow  Certification:: I certify this patient will need inpatient services for at least 2 midnights         Medical History Past Medical History:  Diagnosis Date  . CKD (chronic kidney disease), stage II   . Diabetes mellitus without complication (HCC)    type 1  . Polysubstance abuse (HCC)     Allergies Allergies  Allergen Reactions  . Shellfish Allergy Anaphylaxis    IV Location/Drains/Wounds Patient Lines/Drains/Airways Status    Active Line/Drains/Airways    Name Placement date Placement time Site Days   Peripheral IV 12/03/20 20 G Left;Posterior Hand 12/03/20  0550  Hand  1   Peripheral IV 12/03/20 20 G Left;Posterior Forearm 12/03/20   0550  Forearm  1          Labs/Imaging Results for orders placed or performed during the hospital encounter of 12/03/20 (from the past 48 hour(s))  Comprehensive metabolic panel     Status: Abnormal   Collection Time: 12/03/20  5:34 AM  Result Value Ref Range   Sodium 137 135 - 145 mmol/L   Potassium 5.0 3.5 - 5.1 mmol/L   Chloride 101 98 - 111 mmol/L   CO2 22 22 - 32 mmol/L   Glucose, Bld 261 (H) 70 - 99 mg/dL    Comment: Glucose reference range applies only to samples taken after fasting for at least 8 hours.   BUN 20 6 - 20 mg/dL   Creatinine, Ser 4.131.23 0.61 - 1.24 mg/dL   Calcium 8.9 8.9 - 24.410.3 mg/dL   Total Protein 7.9 6.5 - 8.1 g/dL   Albumin 4.0 3.5 - 5.0 g/dL   AST 88 (H) 15 - 41 U/L   ALT 48 (H) 0 - 44 U/L   Alkaline Phosphatase 117 38 - 126 U/L   Total Bilirubin 0.4 0.3 - 1.2 mg/dL   GFR, Estimated >01>60 >02>60 mL/min    Comment: (NOTE) Calculated using the CKD-EPI Creatinine Equation (2021)    Anion gap 14 5 - 15    Comment: Performed at [facility], [address] [place], [place] [id number]  Salicylate level  Status: Abnormal   Collection Time: 12/03/20  5:34 AM  Result Value Ref Range   Salicylate Lvl <7.0 (L) 7.0 - 30.0 mg/dL    Comment: Performed at [facility], [address] [place], [place] [address]  Acetaminophen level     Status: Abnormal   Collection Time: 12/03/20  5:34 AM  Result Value Ref Range   Acetaminophen (Tylenol), Serum <10 (L) 10 - 30 ug/mL    Comment: (NOTE) Therapeutic concentrations vary significantly. A range of 10-30 ug/mL  may be an effective concentration for many patients. However, some  are best treated at concentrations outside of this range. Acetaminophen concentrations >150 ug/mL at 4 hours after ingestion  and >50 ug/mL at 12 hours after ingestion are often associated with  toxic reactions.  Performed at [facility], [address] [place], [place] [address]    Ethanol     Status: None   Collection Time: 12/03/20  5:34 AM  Result Value Ref Range   Alcohol, Ethyl (B) <10 <10 mg/dL    Comment: (NOTE) Lowest detectable limit for serum alcohol is 10 mg/dL.  For medical purposes only. Performed at [facility], [address], [place], [place] [address]   CBC WITH DIFFERENTIAL     Status: Abnormal   Collection Time: 12/03/20  5:34 AM  Result Value Ref Range   WBC 17.7 (H) 4.0 - 10.5 K/uL   RBC 4.11 (L) 4.22 - 5.81 MIL/uL   Hemoglobin 12.8 (L) 13.0 - 17.0 g/dL   HCT 66.4 40.3 - 47.4 %   MCV 101.2 (H) 80.0 - 100.0 fL   MCH 31.1 26.0 - 34.0 pg   MCHC 30.8 30.0 - 36.0 g/dL   RDW 25.9 56.3 - 87.5 %   Platelets 385 150 - 400 K/uL   nRBC 0.0 0.0 - 0.2 %   Neutrophils Relative % 77 %   Neutro Abs 13.7 (H) 1.7 - 7.7 K/uL   Lymphocytes Relative 10 %   Lymphs Abs 1.8 0.7 - 4.0 K/uL   Monocytes Relative 11 %   Monocytes Absolute 1.9 (H) 0.1 - 1.0 K/uL   Eosinophils Relative 1 %   Eosinophils Absolute 0.1 0.0 - 0.5 K/uL   Basophils Relative 0 %   Basophils Absolute 0.1 0.0 - 0.1 K/uL   Immature Granulocytes 1 %   Abs Immature Granulocytes 0.09 (H) 0.00 - 0.07 K/uL    Comment: Performed at [facility], [address] [place], [place] [address]  Troponin I (High Sensitivity)     Status: Abnormal   Collection Time: 12/03/20  5:34 AM  Result Value Ref Range   Troponin I (High Sensitivity) 35 (H) <18 ng/L    Comment: (NOTE) Elevated high sensitivity troponin I (hsTnI) values and significant  changes across serial measurements may suggest ACS but many other  chronic and acute conditions are known to elevate hsTnI results.  Refer to the "Links" section for chest pain algorithms and additional  guidance. Performed at [facility], [address] [place], [place] [address]   Protime-INR     Status: None   Collection Time: 12/03/20  5:34 AM  Result Value Ref Range   Prothrombin Time 12.4  11.4 - 15.2 seconds   INR 0.9 0.8 - 1.2    Comment: (NOTE) INR goal varies based on device and disease states. Performed at [facility], [address] [place], [place] [address]   APTT  Status: None   Collection Time: 12/03/20  5:34 AM  Result Value Ref Range   aPTT 31 24 - 36 seconds    Comment: Performed at [facility], [address] [place], [place] [address]  CBG monitoring, ED     Status: Abnormal   Collection Time: 12/03/20  5:41 AM  Result Value Ref Range   Glucose-Capillary 233 (H) 70 - 99 mg/dL    Comment: Glucose reference range applies only to samples taken after fasting for at least 8 hours.  Blood gas, venous (at [facility] and [facility], not at [facility])     Status: Abnormal   Collection Time: 12/03/20  5:57 AM  Result Value Ref Range   FIO2 21.00    pH, Ven 7.183 (LL) 7.250 - 7.430    Comment: CRITICAL RESULT CALLED TO, READ BACK BY AND VERIFIED WITH: SAVOIE,B. RN AT 9735 12/03/20 MULLINS,T    pCO2, Ven 69.7 (H) 44.0 - 60.0 mmHg   pO2, Ven 37.9 32.0 - 45.0 mmHg   Bicarbonate 25.2 20.0 - 28.0 mmol/L   Acid-base deficit 4.1 (H) 0.0 - 2.0 mmol/L   O2 Saturation 55.6 %   Patient temperature 98.6     Comment: Performed at [facility], [address] [place], [place] [address]  Lactic acid, plasma     Status: Abnormal   Collection Time: 12/03/20  5:57 AM  Result Value Ref Range   Lactic Acid, Venous 7.4 (HH) 0.5 - 1.9 mmol/L    Comment: CRITICAL RESULT CALLED TO, READ BACK BY AND VERIFIED WITH: SMITH,A. RN AT [phone] 12/03/20 MULLINS,T Performed at [facility], [address], [place], [place] [address]   Blood culture (routine single)     Status: None (Preliminary result)   Collection Time: 12/03/20  5:59 AM   Specimen: BLOOD  Result Value Ref Range   Specimen Description      BLOOD RIGHT ANTECUBITAL Performed at [facility], [address], [place], [place] [address]     Special Requests      BOTTLES DRAWN AEROBIC AND ANAEROBIC Blood Culture adequate volume Performed at [facility], [address] [place], [place] [address]    Culture      NO GROWTH <12 HOURS Performed at [facility], [address] [place], [place] [address]    Report Status PENDING   Resp Panel by RT-PCR (Flu A&B, Covid) Nasopharyngeal Swab     Status: Abnormal   Collection Time: 12/03/20  6:30 AM   Specimen: Nasopharyngeal Swab; Nasopharyngeal(NP) swabs in vial transport medium  Result Value Ref Range   SARS Coronavirus 2 by RT PCR POSITIVE (A) NEGATIVE    Comment: RESULT CALLED TO, READ BACK BY AND VERIFIED WITH: GARRISON,G. RN @0817  ON 8.19.2022 BY NMCCOY (NOTE) SARS-CoV-2 target nucleic acids are DETECTED.  The SARS-CoV-2 RNA is generally detectable in upper respiratory specimens during the acute phase of infection. Positive results are indicative of the presence of the identified virus, but do not rule out bacterial infection or co-infection with other pathogens not detected by the test. Clinical correlation with patient history and other diagnostic information is necessary to determine patient infection status. The expected result is Negative.  Fact Sheet for Patients: 8.21.2022  Fact Sheet for Healthcare Providers: BloggerCourse.com  This test is not yet approved or cleared by the [place] FDA and  has been authorized for detection and/or diagnosis of SARS-CoV-2 by FDA under an Emergency Use Authorization (  EUA).  This EUA will remain in effect (meaning this t est can be used) for the duration of  the COVID-19 declaration under Section 564(b)(1) of the Act, 21 U.S.C. section 360bbb-3(b)(1), unless the authorization is terminated or revoked sooner.     Influenza A by PCR NEGATIVE NEGATIVE   Influenza B by PCR NEGATIVE NEGATIVE    Comment: (NOTE) The Xpert Xpress  SARS-CoV-2/FLU/RSV plus assay is intended as an aid in the diagnosis of influenza from Nasopharyngeal swab specimens and should not be used as a sole basis for treatment. Nasal washings and aspirates are unacceptable for Xpert Xpress SARS-CoV-2/FLU/RSV testing.  Fact Sheet for Patients: BloggerCourse.com  Fact Sheet for Healthcare Providers: SeriousBroker.it  This test is not yet approved or cleared by the [place] FDA and has been authorized for detection and/or diagnosis of SARS-CoV-2 by FDA under an Emergency Use Authorization (EUA). This EUA will remain in effect (meaning this test can be used) for the duration of the COVID-19 declaration under Section 564(b)(1) of the Act, 21 U.S.C. section 360bbb-3(b)(1), unless the authorization is terminated or revoked.  Performed at [facility], [address], [place], [place] [address]   Lactic acid, plasma     Status: None   Collection Time: 12/03/20  8:50 AM  Result Value Ref Range   Lactic Acid, Venous 0.8 0.5 - 1.9 mmol/L    Comment: Performed at [facility], [address], [place], [place] [address]  Troponin I (High Sensitivity)     Status: Abnormal   Collection Time: 12/03/20  8:50 AM  Result Value Ref Range   Troponin I (High Sensitivity) 106 (HH) <18 ng/L    Comment: DELTA CHECK NOTED CRITICAL RESULT CALLED TO, READ BACK BY AND VERIFIED WITH: WILSON,G. RN AT 1020 12/03/20 MULLINS,T (NOTE) Elevated high sensitivity troponin I (hsTnI) values and significant  changes across serial measurements may suggest ACS but many other  chronic and acute conditions are known to elevate hsTnI results.  Refer to the Links section for chest pain algorithms and additional  guidance. Performed at [facility], [address] [place], [place] [address]   Urine rapid drug screen (hosp performed)     Status: Abnormal    Collection Time: 12/03/20  9:43 AM  Result Value Ref Range   Opiates POSITIVE (A) NONE DETECTED   Cocaine NONE DETECTED NONE DETECTED   Benzodiazepines NONE DETECTED NONE DETECTED   Amphetamines NONE DETECTED NONE DETECTED   Tetrahydrocannabinol POSITIVE (A) NONE DETECTED   Barbiturates NONE DETECTED NONE DETECTED    Comment: (NOTE) DRUG SCREEN FOR MEDICAL PURPOSES ONLY.  IF CONFIRMATION IS NEEDED FOR ANY PURPOSE, NOTIFY LAB WITHIN 5 DAYS.  LOWEST DETECTABLE LIMITS FOR URINE DRUG SCREEN Drug Class                     Cutoff (ng/mL) Amphetamine and metabolites    1000 Barbiturate and metabolites    200 Benzodiazepine                 200 Tricyclics and metabolites     300 Opiates and metabolites        300 Cocaine and metabolites        300 THC                            50 Performed at [facility], [address] [place], [place] [address]  Urinalysis, Routine w reflex microscopic Urine, Clean Catch     Status: Abnormal   Collection Time: 12/03/20  9:43 AM  Result Value Ref Range   Color, Urine STRAW (A) YELLOW   APPearance CLEAR CLEAR   Specific Gravity, Urine 1.030 1.005 - 1.030   pH 5.0 5.0 - 8.0   Glucose, UA >=500 (A) NEGATIVE mg/dL   Hgb urine dipstick NEGATIVE NEGATIVE   Bilirubin Urine NEGATIVE NEGATIVE   Ketones, ur 5 (A) NEGATIVE mg/dL   Protein, ur NEGATIVE NEGATIVE mg/dL   Nitrite NEGATIVE NEGATIVE   Leukocytes,Ua NEGATIVE NEGATIVE   WBC, UA 0-5 0 - 5 WBC/hpf   Bacteria, UA NONE SEEN NONE SEEN   Mucus PRESENT    Hyaline Casts, UA PRESENT     Comment: Performed at [facility], [address], [place], [place] [address]  Lactic acid, plasma     Status: None   Collection Time: 12/03/20 10:49 AM  Result Value Ref Range   Lactic Acid, Venous 1.6 0.5 - 1.9 mmol/L    Comment: Performed at [facility], [address], [place], [place] [address]  CBG monitoring, ED     Status: Abnormal   Collection  Time: 12/03/20 12:07 PM  Result Value Ref Range   Glucose-Capillary 383 (H) 70 - 99 mg/dL    Comment: Glucose reference range applies only to samples taken after fasting for at least 8 hours.   Comment 1 Notify RN    Comment 2 Document in Chart   Lactic acid, plasma     Status: None   Collection Time: 12/03/20  2:34 PM  Result Value Ref Range   Lactic Acid, Venous 1.2 0.5 - 1.9 mmol/L    Comment: Performed at [facility], [address], [place], [place] [address]  HIV Antibody (routine testing w rflx)     Status: None   Collection Time: 12/03/20  2:34 PM  Result Value Ref Range   HIV Screen 4th Generation wRfx Non Reactive Non Reactive    Comment: Performed at [facility], [address], [place], [place] [address]  CBC     Status: Abnormal   Collection Time: 12/03/20  2:34 PM  Result Value Ref Range   WBC 14.2 (H) 4.0 - 10.5 K/uL   RBC 3.83 (L) 4.22 - 5.81 MIL/uL   Hemoglobin 12.0 (L) 13.0 - 17.0 g/dL   HCT 29.5 (L) 62.1 - 30.8 %   MCV 98.7 80.0 - 100.0 fL   MCH 31.3 26.0 - 34.0 pg   MCHC 31.7 30.0 - 36.0 g/dL   RDW 65.7 84.6 - 96.2 %   Platelets 320 150 - 400 K/uL   nRBC 0.0 0.0 - 0.2 %    Comment: Performed at [facility], [address] [place], [place] [address]  Creatinine, serum     Status: None   Collection Time: 12/03/20  2:34 PM  Result Value Ref Range   Creatinine, Ser 1.13 0.61 - 1.24 mg/dL   GFR, Estimated >13 >24 mL/min    Comment: (NOTE) Calculated using the CKD-EPI Creatinine Equation (2021) Performed at [facility], [address] [place], [place] [address]   Folate, serum, performed at [facility] lab     Status: None   Collection Time: 12/03/20  2:34 PM  Result Value Ref Range   Folate 54.1 >5.9 ng/mL    Comment: RESULTS CONFIRMED BY MANUAL DILUTION Performed at [facility],  [address] [place], [place] [address]   Vitamin B12     Status: Abnormal    Collection Time: 12/03/20  2:34 PM  Result Value Ref Range   Vitamin B-12 1,064 (H) 180 - 914 pg/mL    Comment: (NOTE) This assay is not validated for testing neonatal or myeloproliferative syndrome specimens for Vitamin B12 levels. Performed at [facility], [address], [place], [place] [address]   CBG monitoring, ED     Status: Abnormal   Collection Time: 12/03/20  4:32 PM  Result Value Ref Range   Glucose-Capillary 215 (H) 70 - 99 mg/dL    Comment: Glucose reference range applies only to samples taken after fasting for at least 8 hours.   CT HEAD WO CONTRAST ( )  Result Date: 12/03/2020 CLINICAL DATA:  Syncope, recurrent LOC, unresponsive, s/p CPR, AMS EXAM: CT HEAD WITHOUT CONTRAST TECHNIQUE: Contiguous axial images were obtained from the base of the skull through the vertex without intravenous contrast. COMPARISON:  None. FINDINGS: Brain: No evidence of acute infarction, hemorrhage, hydrocephalus, extra-axial collection or mass lesion/mass effect. Vascular: No hyperdense vessel or unexpected calcification. Skull: Normal. Negative for fracture or focal lesion. Sinuses/Orbits: Complete opacification of the left maxillary sinus. Mild right maxillary sinus mucosal thickening. Other: None. IMPRESSION: 1. No acute intracranial findings. 2. Complete opacification of the left maxillary sinus. Mild right maxillary sinus mucosal thickening. Electronically Signed   By: Duanne Guess D.O.   On: 12/03/2020 10:13   CT Angio Chest PE W and/or Wo Contrast  Result Date: 12/03/2020 CLINICAL DATA:  Syncope, unresponsive, CPR, COVID positive 12/03/2020 EXAM: CT ANGIOGRAPHY CHEST WITH CONTRAST TECHNIQUE: Multidetector CT imaging of the chest was performed using the standard protocol during bolus administration of intravenous contrast. Multiplanar CT image reconstructions and MIPs were obtained to evaluate the vascular anatomy. CONTRAST:  OMNIPAQUE IOHEXOL 350 MG/ML SOLN  COMPARISON:  11/22/2007 FINDINGS: Cardiovascular: Minor aortic atherosclerosis. Pulsation artifact across the ascending aorta. Negative for aneurysm or dissection. No mediastinal hemorrhage or hematoma. Central pulmonary arteries appear patent. No significant filling defect or pulmonary embolus by CTA. Normal heart size. No pericardial effusion. Central venous structures are patent. No veno-occlusive process. Mediastinum/Nodes: No enlarged mediastinal, hilar, or axillary lymph nodes. Thyroid gland, trachea, and esophagus demonstrate no significant findings. Lungs/Pleura: Bilateral lower lobe patchy and nodular peribronchovascular airspace opacities compatible with pneumonia and or aspiration. Scattered areas of ground-glass attenuation related to hypoventilatory changes. Trachea central airways are patent. No pleural abnormality, effusion or pneumothorax. Upper Abdomen: No acute abnormality. Musculoskeletal: No chest wall abnormality. No acute or significant osseous findings. Review of the MIP images confirms the above findings. IMPRESSION: Negative for significant acute pulmonary embolus by CTA. No other acute intrathoracic vascular finding. Patchy and nodular peribronchovascular airspace opacities compatible with pneumonia or aspiration. Additional bilateral scattered patchy ground-glass attenuation favored to be hypoventilatory changes. Aortic Atherosclerosis (ICD10-I70.0). Electronically Signed   By: Judie Petit.  Shick M.D.   On: 12/03/2020 09:59   CT ABDOMEN PELVIS W CONTRAST  Result Date: 12/03/2020 CLINICAL DATA:  Abdominal pain.  Unresponsive. EXAM: CT ABDOMEN AND PELVIS WITH CONTRAST TECHNIQUE: Multidetector CT imaging of the abdomen and pelvis was performed using the standard protocol following bolus administration of intravenous contrast. CONTRAST:  OMNIPAQUE IOHEXOL 350 MG/ML SOLN COMPARISON:  None. FINDINGS: Lower chest: Bibasilar airspace disease which may reflect bibasilar pneumonia versus aspiration  pneumonia. Hepatobiliary: No focal liver abnormality is seen. No gallstones, gallbladder wall thickening, or biliary dilatation. Pancreas: Unremarkable. No pancreatic  ductal dilatation or surrounding inflammatory changes. Spleen: Normal in size without focal abnormality. Adrenals/Urinary Tract: Adrenal glands are unremarkable. Kidneys are normal, without renal calculi, focal lesion, or hydronephrosis. Bladder is unremarkable. Stomach/Bowel: Severe gastric distension with a large air-fluid level. Small mild distal to the stomach is decompressed. Appendix appears normal. No evidence of bowel wall thickening, distention, or inflammatory changes. Large amount of stool throughout the colon. Vascular/Lymphatic: No significant vascular findings are present. No enlarged abdominal or pelvic lymph nodes. Reproductive: Prostate is unremarkable. Other: No abdominal wall hernia or abnormality. No abdominopelvic ascites. Musculoskeletal: No acute or significant osseous findings. IMPRESSION: 1. Bibasilar airspace disease as can be seen with bibasilar pneumonia versus aspiration pneumonia. 2. Severe gastric distension with a large air-fluid level. Small mild distal to the stomach is decompressed. This appearance can be seen with gastroparesis versus gastric outlet obstruction or gastroenteritis. Electronically Signed   By: Elige Ko M.D.   On: 12/03/2020 10:16   DG Chest Port 1 View  Result Date: 12/03/2020 CLINICAL DATA:  Shortness of breath EXAM: PORTABLE CHEST 1 VIEW COMPARISON:  03/13/2017 FINDINGS: Normal heart size and mediastinal contours. There is no edema, consolidation, effusion, or pneumothorax. No acute osseous finding. IMPRESSION: Negative portable chest. Electronically Signed   By: Marnee Spring M.D.   On: 12/03/2020 05:54   ECHOCARDIOGRAM COMPLETE  Result Date: 12/03/2020    ECHOCARDIOGRAM REPORT   Patient Name:   [name] Date of Exam: 12/03/2020 Medical Rec #:  [id number]       Height:       68.0  in Accession #:    [id number]      Weight:       175.0 lb Date of Birth:  16-Apr-1976       BSA:          1.931 m Patient Age:    45 years        BP:           147/93 mmHg Patient Gender: M               HR:           110 bpm. Exam Location:  Inpatient Procedure: 2D Echo, Cardiac Doppler, Color Doppler and Intracardiac            Opacification Agent Indications:    Covid                 Elevated Troponins  History:        Patient has no prior history of Echocardiogram examinations.                 Risk Factors:Diabetes.  Sonographer:    Neomia Dear RDCS Referring Phys: [id number] Teddy Spike  Sonographer Comments: Suboptimal parasternal window and suboptimal apical window. IMPRESSIONS  1. Left ventricular ejection fraction, by estimation, is 40 to 45%. The left ventricle has mildly decreased function. The left ventricle demonstrates regional wall motion abnormalities with septal hypokinesis. The apex is not well-visualized, but I am concerned that an apical aneurysm may be present. Would consider cardiac MRI for full evaluation of LV. Left ventricular diastolic parameters are consistent with Grade II diastolic dysfunction (pseudonormalization).  2. Right ventricular systolic function is normal. The right ventricular size is normal. There is normal pulmonary artery systolic pressure. The estimated right ventricular systolic pressure is 31.9 mmHg.  3. The mitral valve is normal in structure. Trivial mitral valve regurgitation. No evidence of mitral stenosis.  4. The aortic valve  is tricuspid. Aortic valve regurgitation is not visualized. No aortic stenosis is present.  5. The inferior vena cava is normal in size with greater than 50% respiratory variability, suggesting right atrial pressure of 3 mmHg. FINDINGS  Left Ventricle: Left ventricular ejection fraction, by estimation, is 40 to 45%. The left ventricle has mildly decreased function. The left ventricle demonstrates regional wall motion abnormalities. Definity  contrast agent was given IV to delineate the left ventricular endocardial borders. The left ventricular internal cavity size was normal in size. There is no left ventricular hypertrophy. Left ventricular diastolic parameters are consistent with Grade II diastolic dysfunction (pseudonormalization). Right Ventricle: The right ventricular size is normal. No increase in right ventricular wall thickness. Right ventricular systolic function is normal. There is normal pulmonary artery systolic pressure. The tricuspid regurgitant velocity is 2.69 m/s, and  with an assumed right atrial pressure of 3 mmHg, the estimated right ventricular systolic pressure is 31.9 mmHg. Left Atrium: Left atrial size was normal in size. Right Atrium: Right atrial size was normal in size. Pericardium: There is no evidence of pericardial effusion. Mitral Valve: The mitral valve is normal in structure. Trivial mitral valve regurgitation. No evidence of mitral valve stenosis. Tricuspid Valve: The tricuspid valve is normal in structure. Tricuspid valve regurgitation is trivial. Aortic Valve: The aortic valve is tricuspid. Aortic valve regurgitation is not visualized. No aortic stenosis is present. Aortic valve mean gradient measures 4.0 mmHg. Aortic valve peak gradient measures 6.7 mmHg. Aortic valve area, by VTI measures 2.24 cm. Pulmonic Valve: The pulmonic valve was normal in structure. Pulmonic valve regurgitation is not visualized. Aorta: The aortic root is normal in size and structure. Venous: The inferior vena cava is normal in size with greater than 50% respiratory variability, suggesting right atrial pressure of 3 mmHg. IAS/Shunts: No atrial level shunt detected by color flow Doppler.  LEFT VENTRICLE PLAX 2D LVIDd:         5.10 cm  Diastology LVIDs:         4.10 cm  LV e' medial:    6.31 cm/s LV PW:         1.00 cm  LV E/e' medial:  10.6 LV IVS:        0.90 cm  LV e' lateral:   7.18 cm/s LVOT diam:     1.90 cm  LV E/e' lateral: 9.3 LV SV:          43 LV SV Index:   22 LVOT Area:     2.84 cm  RIGHT VENTRICLE RV Basal diam:  3.30 cm RV Mid diam:    2.00 cm RV S prime:     17.10 cm/s TAPSE (M-mode): 1.9 cm LEFT ATRIUM           Index       RIGHT ATRIUM           Index LA diam:      3.20 cm 1.66 cm/m  RA Area:     12.40 cm LA Vol (A2C): 43.9 ml 22.73 ml/m RA Volume:   26.70 ml  13.83 ml/m LA Vol (A4C): 24.4 ml 12.64 ml/m  AORTIC VALVE                   PULMONIC VALVE AV Area (Vmax):    2.18 cm    PV Vmax:       1.36 m/s AV Area (Vmean):   2.08 cm    PV Vmean:      96.350 cm/s  AV Area (VTI):     2.24 cm    PV VTI:        0.233 m AV Vmax:           129.00 cm/s PV Peak grad:  7.3 mmHg AV Vmean:          87.300 cm/s PV Mean grad:  4.0 mmHg AV VTI:            0.190 m AV Peak Grad:      6.7 mmHg AV Mean Grad:      4.0 mmHg LVOT Vmax:         99.00 cm/s LVOT Vmean:        64.000 cm/s LVOT VTI:          0.150 m LVOT/AV VTI ratio: 0.79  AORTA Ao Root diam: 2.50 cm Ao Asc diam:  2.30 cm MITRAL VALVE               TRICUSPID VALVE MV Area (PHT): 6.27 cm    TR Peak grad:   28.9 mmHg MV Decel Time: 121 msec    TR Vmax:        269.00 cm/s MV E velocity: 66.70 cm/s MV A velocity: 60.70 cm/s  SHUNTS MV E/A ratio:  1.10        Systemic VTI:  0.15 m                            Systemic Diam: 1.90 cm Dalton McleanMD Electronically signed by Wilfred Lacy Signature Date/Time: 12/03/2020/2:59:02 PM    Final    US Abdomen Limited RUQ (LIVER/GB)  Result Date: 12/03/2020 CLINICAL DATA:  Abnormal LFTs EXAM: ULTRASOUND ABDOMEN LIMITED RIGHT UPPER QUADRANT COMPARISON:  Same day CT abdomen/pelvis FINDINGS: Gallbladder: No gallstones or wall thickening visualized. No sonographic Murphy sign noted by sonographer. Common bile duct: Diameter: 5 mm Liver: No focal lesion identified. Within normal limits in parenchymal echogenicity. Portal vein is patent on color Doppler imaging with normal direction of blood flow towards the liver. Other: None. IMPRESSION: Unremarkable right  upper quadrant ultrasound. Electronically Signed   By: Lesia Hausen M.D.   On: 12/03/2020 13:34    Pending Labs Unresulted Labs (From admission, onward)    Start     Ordered   12/10/20 0500  Creatinine, serum  (enoxaparin (LOVENOX)    CrCl >/= 30 ml/min)  Weekly,   R     Comments: while on enoxaparin therapy    12/03/20 1228   12/04/20 0500  Protime-INR  Tomorrow morning,   R        12/03/20 1228   12/04/20 0500  Cortisol-am, blood  Tomorrow morning,   R        12/03/20 1228   12/04/20 0500  Procalcitonin  Tomorrow morning,   R        12/03/20 1228   12/04/20 0500  CBC with Differential/Platelet  Daily,   R      12/03/20 1228   12/04/20 0500  Comprehensive metabolic panel  Daily,   R      12/03/20 1228   12/04/20 0500  C-reactive protein  Daily,   R      12/03/20 1228   12/04/20 0500  D-dimer, quantitative  Daily,   R      12/03/20 1228   12/04/20 0500  Ferritin  Daily,   R      12/03/20 1228   12/03/20 1229  Hepatitis  panel, acute  Once,   STAT        12/03/20 1228   12/03/20 1229  Vitamin B1  Once,   STAT        12/03/20 1228          Vitals/Pain Today's Vitals   12/04/20 0015 12/04/20 0200 12/04/20 0215 12/04/20 0300  BP: 139/81 140/81 (!) 142/86 136/84  Pulse: (!) 103 100 (!) 102 100  Resp:  (!) 9 (!) 6   Temp:      TempSrc:      SpO2: 100% 98% 100% 100%  Weight:      Height:      PainSc:        Isolation Precautions Airborne and Contact precautions  Medications Medications  naloxone (NARCAN) injection 0.4 mg (has no administration in time range)  insulin glargine-yfgn (SEMGLEE) injection 15 Units (15 Units Subcutaneous Given 12/03/20 2235)  insulin aspart (novoLOG) injection 0-15 Units (5 Units Subcutaneous Given 12/03/20 1635)  insulin aspart (novoLOG) injection 0-5 Units (2 Units Subcutaneous Given 12/03/20 2235)  enoxaparin (LOVENOX) injection 40 mg (40 mg Subcutaneous Given 12/03/20 1634)  0.9 %  sodium chloride infusion ( Intravenous New Bag/Given  12/03/20 1434)  cefTRIAXone (ROCEPHIN) 2 g in sodium chloride 0.9 % 100 mL IVPB (0 g Intravenous Stopped 12/03/20 1649)  azithromycin (ZITHROMAX) 500 mg in sodium chloride 0.9 % 250 mL IVPB (0 mg Intravenous Stopped 12/03/20 1801)  acetaminophen (TYLENOL) tablet 650 mg (has no administration in time range)    Or  acetaminophen (TYLENOL) suppository 650 mg (has no administration in time range)  ondansetron (ZOFRAN) tablet 4 mg ( Oral See Alternative 12/03/20 1649)    Or  ondansetron (ZOFRAN) injection 4 mg (4 mg Intravenous Given 12/03/20 1649)  Ipratropium-Albuterol (COMBIVENT) respimat 1 puff (1 puff Inhalation Given 12/03/20 1423)  guaiFENesin-dextromethorphan (ROBITUSSIN DM) 100-10 MG/5ML syrup 10 mL (has no administration in time range)  chlorpheniramine-HYDROcodone (TUSSIONEX) 10-8 MG/5ML suspension 5 mL (has no administration in time range)  ascorbic acid (VITAMIN C) tablet 500 mg (500 mg Oral Given 12/03/20 1300)  zinc sulfate capsule 220 mg (220 mg Oral Given 12/03/20 1300)  remdesivir 200 mg in sodium chloride 0.9% 250 mL IVPB (0 mg Intravenous Stopped 12/03/20 1451)    Followed by  remdesivir 100 mg in sodium chloride 0.9 % 100 mL IVPB (has no administration in time range)  LORazepam (ATIVAN) tablet 1-4 mg (has no administration in time range)    Or  LORazepam (ATIVAN) injection 1-4 mg (has no administration in time range)  thiamine tablet 100 mg (100 mg Oral Given 12/03/20 1300)    Or  thiamine (B-1) injection 100 mg ( Intravenous See Alternative 12/03/20 1300)  folic acid (FOLVITE) tablet 1 mg (1 mg Oral Given 12/03/20 1300)  multivitamin with minerals tablet 1 tablet (1 tablet Oral Given 12/03/20 1300)  perflutren lipid microspheres (DEFINITY) IV suspension (1 mL Intravenous Given 12/03/20 1430)  sodium chloride 0.9 % bolus 1,000 mL (0 mLs Intravenous Stopped 12/03/20 0701)  sodium chloride 0.9 % bolus 1,000 mL (0 mLs Intravenous Stopped 12/03/20 0726)  piperacillin-tazobactam (ZOSYN) IVPB  3.375 g (0 g Intravenous Stopped 12/03/20 0818)  vancomycin (VANCOREADY) IVPB 1750 mg/350 mL (0 mg Intravenous Stopped 12/03/20 0904)  sodium chloride 0.9 % bolus 1,000 mL (1,000 mLs Intravenous Bolus 12/03/20 0726)  iohexol (OMNIPAQUE) 350 MG/ML injection 100 mL (100 mLs Intravenous Contrast Given 12/03/20 0855)    Mobility walks

## 2020-12-04 NOTE — Progress Notes (Signed)
Patient had blood sugar of 66. 120 ml of orange juice given with sugar responding to 97 after 15 minutes. Instructed to be sure and eat evening meal. Melton Alar

## 2020-12-05 DIAGNOSIS — R21 Rash and other nonspecific skin eruption: Secondary | ICD-10-CM

## 2020-12-05 LAB — GLUCOSE, CAPILLARY
Glucose-Capillary: 156 mg/dL — ABNORMAL HIGH (ref 70–99)
Glucose-Capillary: 188 mg/dL — ABNORMAL HIGH (ref 70–99)
Glucose-Capillary: 258 mg/dL — ABNORMAL HIGH (ref 70–99)

## 2020-12-05 LAB — CBC WITH DIFFERENTIAL/PLATELET
Abs Immature Granulocytes: 0.03 10*3/uL (ref 0.00–0.07)
Basophils Absolute: 0 10*3/uL (ref 0.0–0.1)
Basophils Relative: 0 %
Eosinophils Absolute: 0 10*3/uL (ref 0.0–0.5)
Eosinophils Relative: 1 %
HCT: 35.4 % — ABNORMAL LOW (ref 39.0–52.0)
Hemoglobin: 11.3 g/dL — ABNORMAL LOW (ref 13.0–17.0)
Immature Granulocytes: 0 %
Lymphocytes Relative: 12 %
Lymphs Abs: 1 10*3/uL (ref 0.7–4.0)
MCH: 30.8 pg (ref 26.0–34.0)
MCHC: 31.9 g/dL (ref 30.0–36.0)
MCV: 96.5 fL (ref 80.0–100.0)
Monocytes Absolute: 1 10*3/uL (ref 0.1–1.0)
Monocytes Relative: 13 %
Neutro Abs: 6.1 10*3/uL (ref 1.7–7.7)
Neutrophils Relative %: 74 %
Platelets: 276 10*3/uL (ref 150–400)
RBC: 3.67 MIL/uL — ABNORMAL LOW (ref 4.22–5.81)
RDW: 13.6 % (ref 11.5–15.5)
WBC: 8.3 10*3/uL (ref 4.0–10.5)
nRBC: 0 % (ref 0.0–0.2)

## 2020-12-05 LAB — COMPREHENSIVE METABOLIC PANEL
ALT: 31 U/L (ref 0–44)
AST: 49 U/L — ABNORMAL HIGH (ref 15–41)
Albumin: 3.2 g/dL — ABNORMAL LOW (ref 3.5–5.0)
Alkaline Phosphatase: 89 U/L (ref 38–126)
Anion gap: 9 (ref 5–15)
BUN: 12 mg/dL (ref 6–20)
CO2: 25 mmol/L (ref 22–32)
Calcium: 8.3 mg/dL — ABNORMAL LOW (ref 8.9–10.3)
Chloride: 105 mmol/L (ref 98–111)
Creatinine, Ser: 0.77 mg/dL (ref 0.61–1.24)
GFR, Estimated: >60 mL/min (ref >60)
Glucose, Bld: 92 mg/dL (ref 70–99)
Potassium: 3.5 mmol/L (ref 3.5–5.1)
Sodium: 139 mmol/L (ref 135–145)
Total Bilirubin: 0.4 mg/dL (ref 0.3–1.2)
Total Protein: 6.3 g/dL — ABNORMAL LOW (ref 6.5–8.1)

## 2020-12-05 LAB — D-DIMER, QUANTITATIVE: D-Dimer, Quant: 0.96 ug/mL-FEU — ABNORMAL HIGH (ref 0.00–0.50)

## 2020-12-05 LAB — C-REACTIVE PROTEIN: CRP: 12.9 mg/dL — ABNORMAL HIGH (ref <1.0)

## 2020-12-05 LAB — MAGNESIUM: Magnesium: 1.9 mg/dL (ref 1.7–2.4)

## 2020-12-05 LAB — PHOSPHORUS: Phosphorus: 2.7 mg/dL (ref 2.5–4.6)

## 2020-12-05 LAB — FERRITIN: Ferritin: 105 ng/mL (ref 24–336)

## 2020-12-05 MED ORDER — CLOTRIMAZOLE 1 % EX CREA
TOPICAL_CREAM | Freq: Two times a day (BID) | CUTANEOUS | Status: DC
  Filled 2020-12-05: qty 15

## 2020-12-05 MED ORDER — POTASSIUM CHLORIDE CRYS ER 20 MEQ PO TBCR
40.0000 meq | EXTENDED_RELEASE_TABLET | Freq: Once | ORAL | Status: AC
  Administered 2020-12-05: 40 meq via ORAL
  Filled 2020-12-05: qty 2

## 2020-12-05 MED ORDER — LABETALOL HCL 200 MG PO TABS
200.0000 mg | ORAL_TABLET | Freq: Two times a day (BID) | ORAL | Status: DC
  Administered 2020-12-05 – 2020-12-07 (×4): 200 mg via ORAL
  Filled 2020-12-05 (×4): qty 1

## 2020-12-05 MED ORDER — INSULIN GLARGINE-YFGN 100 UNIT/ML ~~LOC~~ SOLN
10.0000 [IU] | Freq: Every day | SUBCUTANEOUS | Status: DC
  Administered 2020-12-05: 10 [IU] via SUBCUTANEOUS
  Filled 2020-12-05: qty 0.1

## 2020-12-05 MED ORDER — SODIUM CHLORIDE 0.9 % IV SOLN
INTRAVENOUS | Status: DC | PRN
  Administered 2020-12-05: 250 mL via INTRAVENOUS
  Administered 2020-12-05: 40 mL via INTRAVENOUS

## 2020-12-05 MED ORDER — AZITHROMYCIN 250 MG PO TABS
500.0000 mg | ORAL_TABLET | Freq: Every day | ORAL | Status: DC
  Administered 2020-12-05 – 2020-12-07 (×3): 500 mg via ORAL
  Filled 2020-12-05 (×3): qty 2

## 2020-12-05 NOTE — Evaluation (Signed)
Clinical/Bedside Swallow Evaluation Patient Details  Name: [name] MRN: [id number] Date of Birth: [birth year]  Today's Date: 12/05/2020 Time: SLP Start Time (ACUTE ONLY): 1515 SLP Stop Time (ACUTE ONLY): 1530 SLP Time Calculation (min) (ACUTE ONLY): 15 min  Past Medical History:  Past Medical History:  Diagnosis Date   CKD (chronic kidney disease), stage II    Diabetes mellitus without complication (HCC)    type 1   Polysubstance abuse (HCC)    Past Surgical History:  Past Surgical History:  Procedure Laterality Date   DENTAL SURGERY     HPI:  Pt is a 45 year old gentleman history of type 1 diabetes, depression, hypertension, prior history of polysubstance abuse presented to the ED with altered mental status noted to have been found unresponsive around 3 AM with possible opiate overdose.  Head CT was negative for acute abnormalities.  Abdominal CT reported bibasilar airspace disease consistent with pneumonia versus aspiration pneumonia and severe gastric distension consistent with gastroparesis versus gastric outlet obstruction or gastroenteritis.   Assessment / Plan / Recommendation Clinical Impression  Pt was seen for a bedside swallow evaluation in the setting of possible aspiration PNA and he presents with suspected functional oropharyngeal swallowing abilities.  Pt was encountered awake/alert with wife at bedside.  Oral mechanism exam was unremarkable.  Pt consumed trials of thin liquid, puree, and regular solids.  Pt fed himself independently and he demonstrated good bolus acceptance, timely mastication, suspected timely AP transport/swallow initiation, and consistent hyolaryngeal elevation/excursion to observation and palpation.  No overt s/sx of aspiration were observed with any trials.  Recommend continuation of regular solids and thin liquids with medications administered whole with liquid.  No further skilled ST is warranted at this time.  Please re-consult if additional  needs arise.    Of note, pt reported abdominal pain and he stated that food intermittently becomes "stuck" in his esophagus.  Pt may benefit from a GI evaluation if symptoms persist.   SLP Visit Diagnosis: Dysphagia, unspecified (R13.10)    Aspiration Risk  No limitations    Diet Recommendation Thin liquid;Regular   Liquid Administration via: Cup;Straw Medication Administration: Whole meds with liquid Supervision: Patient able to self feed Compensations: Slow rate;Small sips/bites Postural Changes: Seated upright at 90 degrees;Remain upright for at least 30 minutes after po intake    Other  Recommendations Recommended Consults: Consider GI evaluation Oral Care Recommendations: Oral care BID   Follow up Recommendations None        Swallow Study   General HPI: Pt is a 45 year old gentleman history of type 1 diabetes, depression, hypertension, prior history of polysubstance abuse presented to the ED with altered mental status noted to have been found unresponsive around 3 AM with possible opiate overdose.  Head CT was negative for acute abnormalities.  Abdomen CT reported bibasilar airspace disease consistent with pneumonia versus aspiration pneumonia and severe gastic distension consistent with gastroparesis versus gastric outlet obstruction or gastroenteritis. Type of Study: Bedside Swallow Evaluation Previous Swallow Assessment: None Diet Prior to this Study: Regular;Thin liquids Temperature Spikes Noted: Yes Respiratory Status: Room air History of Recent Intubation: No Behavior/Cognition: Alert;Cooperative;Pleasant mood Oral Cavity Assessment: Within Functional Limits Oral Care Completed by SLP: No Oral Cavity - Dentition: Adequate natural dentition Vision: Functional for self-feeding Self-Feeding Abilities: Able to feed self Patient Positioning: Upright in bed Baseline Vocal Quality: Normal Volitional Swallow: Able to elicit    Oral/Motor/Sensory Function Overall Oral  Motor/Sensory Function: Within functional limits   Ice Chips Ice chips: Not tested  Thin Liquid Thin Liquid: Within functional limits    Nectar Thick Nectar Thick Liquid: Not tested   Honey Thick Honey Thick Liquid: Not tested   Puree Puree: Within functional limits   Solid     Solid: Within functional limits     Villa Herb., M.S., CCC-SLP Acute Rehabilitation Services Office: [phone]  [name] [name] 12/05/2020,3:45 PM

## 2020-12-05 NOTE — Progress Notes (Signed)
   12/05/20 1225  Oxygen Therapy  SpO2 100 %  O2 Device Room Air   98% ambulation 150 feet Tolerated well   Lidia Collum, RN

## 2020-12-05 NOTE — Progress Notes (Signed)
PROGRESS NOTE    [name]  WUJ:[id number] DOB: 1976-04-05 DOA: 12/03/2020 PCP: Pcp, No (Confirm with patient/family/NH records and if not entered, [facility] HAS [facility]. "No PCP" if truly none.)   Chief Complaint  Patient presents with   Altered Mental Status    Brief Narrative: 45 year old gentleman history of type 1 diabetes, depression, hypertension, prior history of polysubstance abuse presented to the ED with altered mental status noted to have been found unresponsive around 3 AM with substance treatment from his mouth.  Wife started compressions and called EMS.  When EMS arrived patient was administered Narcan and started to wake up.  Patient transported to the ED found to be hypothermic with tachycardia, elevated lactic acid level, CT angiogram chest with concern for pneumonia.  COVID-19 PCR positive.  Sepsis protocol initiated.  Cardiac enzymes elevated.  2D echo noted to have a depressed EF with wall motion abnormalities.  Patient admitted placed on IV antibiotics, cardiology consulted.   Assessment & Plan:   Principal Problem:   Acute metabolic encephalopathy Active Problems:   Sepsis (HCC)   Substance abuse (HCC)   Alcohol dependence (HCC)   Tobacco abuse   COVID-19   Pneumonia   Transaminitis   Essential hypertension   Abnormal echocardiogram   Elevated troponin   Elevated LFTs   New onset a-fib (HCC)   Polysubstance abuse (HCC)  1 acute metabolic encephalopathy/polysubstance abuse/overdose on opioid -Patient admitted to progressive care unit as he had presented with unresponsiveness which improved after administration of Narcan when EMS presented to patient's home. -Patient denies any opioid use.  UDS done positive for opiates and THC. -Patient counseled on further drug use. -Head CT done negative for any acute abnormalities. -CT angiogram chest done negative for PE however concerning for bibasilar infiltrate/pneumonia. -Clinical  improvement and likely close to baseline. -Continue empiric IV antibiotics. -Supportive care.  2.  Sepsis secondary to pneumonia, POA -Patient presented with criteria for sepsis with a sinus tachycardia, tachypnea, elevated lactic acid level, hypothermia. -CT angiogram chest done negative for PE however concerning for bibasilar pneumonia. -CT abdomen and pelvis consistent with bibasilar pneumonia. -Urinalysis nitrite negative, leukocytes negative. -Blood cultures pending with no growth to date. -Afebrile. -Concern for possible aspiration. -SLP evaluation pending. -Continue IV Rocephin.   -Change IV azithromycin to oral azithromycin.   -Continue Flonase, Combivent, PPI, Claritin.   -Supportive care.    3.  Elevated troponin/abnormal 2D echo/cardiomyopathy with ? apical aneurysm -Patient noted to have elevated troponin felt likely secondary due to demand ischemia. -Patient denies any chest pain. -2D echo done with EF of 40 to 45%, WMA, grade 2 diastolic dysfunction, concern for apical aneurysm. -Check a fasting lipid panel. -Patient placed on aspirin, ARB, beta-blocker. -Cardiology consulted and currently recommending outpatient cardiac MRI, addition of statin prior to discharge. -Check a fasting lipid panel -Polysubstance cessation stressed to patient. -We will need close outpatient follow-up with cardiology.  4.  Hypertension -Continue ARB.  Increase labetalol to 200 mg twice daily.  5.  COVID-19 infection -Improvement with respiratory status currently with sats of 100% on room air. -Continue IV remdesivir, Combivent, vitamin C, zinc.   -Continue scheduled Combivent, Claritin, PPI, Flonase.   -Airborne isolation.    6.  Poorly controlled diabetes mellitus type 1 -Hemoglobin A1c 12.4 (11/23/2020) -CBG 84 this morning.  -DC Tradjenta. -Decrease Semglee to 10 units daily.  SSI. -Diabetes coordinator following.  7.  Constipation -Resolved on laxatives of MiraLAX and Senokot-S.  Patient status post sorbitol and Reglan.   -Patient stated having multiple bowel movements.   -Discontinue laxatives.   8.  History of alcohol abuse -Patient with no signs of significant alcohol withdrawal. -Continue Ativan withdrawal protocol, thiamine, folic acid, multivitamin.  9.  Transaminitis -Likely secondary to problem #1. -Acute hepatitis panel negative. -Right upper quadrant abdominal ultrasound unremarkable. -LFTs trending down. -Outpatient follow-up.  10.  Tobacco abuse -Tobacco cessation stressed to patient.  11.  ?Transient A. fib -Per cardiology it was noted that patient had a transient A. fib when EMS got there and subsequently now in normal sinus rhythm. -Continue labetalol.   -Per cardiology.  12.  Rash -Patient with rash on upper back, likely a contact dermatitis from patient's hair versus tinea. -Trial of Lotrimin twice daily.      DVT prophylaxis: Lovenox Code Status: Full Family Communication: Updated patient and wife at bedside. Disposition:   Status is: Inpatient  Remains inpatient appropriate because:Inpatient level of care appropriate due to severity of illness  Dispo: The patient is from: Home              Anticipated d/c is to: Home              Patient currently is not medically stable to d/c.   Difficult to place patient No       Consultants:  Cardiology: Dr. Graciela Husbands 12/04/2020  Procedures:  CT head 12/03/2020 CT angiogram chest 12/03/2020 CT abdomen and pelvis 12/03/2020 Chest x-ray 12/03/2020 Abdominal ultrasound 12/03/2020 2D echo 12/03/2020  Antimicrobials:  IV remdesivir 12/03/2020>>>> IV Rocephin 12/03/2020>>> IV azithromycin 12/03/2020>>> oral azithromycin 12/05/2020>>>>   Subjective: Laying in bed.  Overall feeling better.  Stated had multiple bowel movements after laxatives.  No chest pain.  Still with shortness of breath on exertion however improving.  States has been compliant with his medication since last  hospitalization.  Objective: Vitals:   12/04/20 1644 12/04/20 2018 12/05/20 0500 12/05/20 1046  BP: (!) 144/84 136/84  133/74  Pulse: 88 (!) 109  99  Resp: 18 18 17    Temp: 98.2 F (36.8 C) 100.3 F (37.9 C)    TempSrc: Oral Oral    SpO2: 100% 99%    Weight:      Height:        Intake/Output Summary (Last 24 hours) at 12/05/2020 1152 Last data filed at 12/04/2020 1700 Gross per 24 hour  Intake --  Output 625 ml  Net -625 ml    Filed Weights   12/03/20 0541  Weight: 79.4 kg    Examination:  General exam: : NAD Respiratory system: Some coarse breath sounds in the bases.  No wheezing.  Fair air movement.  Speaking in full sentences. Cardiovascular system: Regular rate and rhythm no murmurs rubs or gallops.  No JVD.  No lower extremity edema.  Gastrointestinal system: Abdomen soft, nontender, nondistended, positive bowel sounds.  No rebound.  No guarding. Central nervous system: Alert and oriented. No focal neurological deficits. Extremities: Symmetric 5 x 5 power. Skin: Rash noted on upper back.  Psychiatry: Judgement and insight appear normal. Mood & affect appropriate.  Data Reviewed: I have personally reviewed following labs and imaging studies  CBC: Recent Labs  Lab 12/03/20 0534 12/03/20 1434 12/04/20 0540 12/05/20 0337  WBC 17.7* 14.2* 12.3* 8.3  NEUTROABS 13.7*  --  10.4* 6.1  HGB 12.8* 12.0* 11.8* 11.3*  HCT 41.6 37.8* 36.9* 35.4*  MCV 101.2* 98.7 97.1 96.5  PLT 385 320 292 276  Basic Metabolic Panel: Recent Labs  Lab 12/03/20 0534 12/03/20 1434 12/04/20 0540 12/05/20 0337  NA 137  --  140 139  K 5.0  --  4.0 3.5  CL 101  --  106 105  CO2 22  --  27 25  GLUCOSE 261*  --  75 92  BUN 20  --  18 12  CREATININE 1.23 1.13 0.95 0.77  CALCIUM 8.9  --  8.4* 8.3*  MG  --   --   --  1.9  PHOS  --   --   --  2.7     GFR: Estimated Creatinine Clearance: 110.4 mL/min (by C-G formula based on SCr of 0.77 mg/dL).  Liver Function Tests: Recent  Labs  Lab 12/03/20 0534 12/04/20 0540 12/05/20 0337  AST 88* 39 49*  ALT 48* 33 31  ALKPHOS 117 94 89  BILITOT 0.4 0.4 0.4  PROT 7.9 7.1 6.3*  ALBUMIN 4.0 3.3* 3.2*     CBG: Recent Labs  Lab 12/03/20 0541 12/03/20 1207 12/03/20 1632 12/04/20 0727 12/05/20 0827  GLUCAP 233* 383* 215* 84 156*      Recent Results (from the past 240 hour(s))  Blood culture (routine single)     Status: None (Preliminary result)   Collection Time: 12/03/20  5:59 AM   Specimen: BLOOD  Result Value Ref Range Status   Specimen Description   Final    BLOOD RIGHT ANTECUBITAL Performed at [facility], [address], [place], [place] [address]    Special Requests   Final    BOTTLES DRAWN AEROBIC AND ANAEROBIC Blood Culture adequate volume Performed at [facility], [address], [place], [place] [address]    Culture   Final    NO GROWTH 1 DAY Performed at [facility], [address] [place], [place] [address]    Report Status PENDING  Incomplete  Resp Panel by RT-PCR (Flu A&B, Covid) Nasopharyngeal Swab     Status: Abnormal   Collection Time: 12/03/20  6:30 AM   Specimen: Nasopharyngeal Swab; Nasopharyngeal(NP) swabs in vial transport medium  Result Value Ref Range Status   SARS Coronavirus 2 by RT PCR POSITIVE (A) NEGATIVE Final    Comment: RESULT CALLED TO, READ BACK BY AND VERIFIED WITH: GARRISON,G. RN @0817  ON 8.19.2022 BY NMCCOY (NOTE) SARS-CoV-2 target nucleic acids are DETECTED.  The SARS-CoV-2 RNA is generally detectable in upper respiratory specimens during the acute phase of infection. Positive results are indicative of the presence of the identified virus, but do not rule out bacterial infection or co-infection with other pathogens not detected by the test. Clinical correlation with patient history and other diagnostic information is necessary to determine patient infection status. The expected result is Negative.  Fact  Sheet for Patients: 8.21.2022  Fact Sheet for Healthcare Providers: BloggerCourse.com  This test is not yet approved or cleared by the [place] FDA and  has been authorized for detection and/or diagnosis of SARS-CoV-2 by FDA under an Emergency Use Authorization (EUA).  This EUA will remain in effect (meaning this t est can be used) for the duration of  the COVID-19 declaration under Section 564(b)(1) of the Act, 21 U.S.C. section 360bbb-3(b)(1), unless the authorization is terminated or revoked sooner.     Influenza A by PCR NEGATIVE NEGATIVE Final   Influenza B by PCR NEGATIVE NEGATIVE Final    Comment: (NOTE) The Xpert Xpress SARS-CoV-2/FLU/RSV plus assay is intended as an aid in the diagnosis  of influenza from Nasopharyngeal swab specimens and should not be used as a sole basis for treatment. Nasal washings and aspirates are unacceptable for Xpert Xpress SARS-CoV-2/FLU/RSV testing.  Fact Sheet for Patients: BloggerCourse.com  Fact Sheet for Healthcare Providers: SeriousBroker.it  This test is not yet approved or cleared by the [place] FDA and has been authorized for detection and/or diagnosis of SARS-CoV-2 by FDA under an Emergency Use Authorization (EUA). This EUA will remain in effect (meaning this test can be used) for the duration of the COVID-19 declaration under Section 564(b)(1) of the Act, 21 U.S.C. section 360bbb-3(b)(1), unless the authorization is terminated or revoked.  Performed at [facility], [address], [place], [place] [address]           Radiology Studies: ECHOCARDIOGRAM COMPLETE  Result Date: 12/03/2020    ECHOCARDIOGRAM REPORT   Patient Name:   LEMICHAEL KONIG Date of Exam: 12/03/2020 Medical Rec #:  [id number]       Height:       68.0 in Accession #:    [id number]      Weight:       175.0 lb Date of Birth:   07-17-1975       BSA:          1.931 m Patient Age:    45 years        BP:           147/93 mmHg Patient Gender: M               HR:           110 bpm. Exam Location:  Inpatient Procedure: 2D Echo, Cardiac Doppler, Color Doppler and Intracardiac            Opacification Agent Indications:    Covid                 Elevated Troponins  History:        Patient has no prior history of Echocardiogram examinations.                 Risk Factors:Diabetes.  Sonographer:    Neomia Dear RDCS Referring Phys: [id number] Teddy Spike  Sonographer Comments: Suboptimal parasternal window and suboptimal apical window. IMPRESSIONS  1. Left ventricular ejection fraction, by estimation, is 40 to 45%. The left ventricle has mildly decreased function. The left ventricle demonstrates regional wall motion abnormalities with septal hypokinesis. The apex is not well-visualized, but I am concerned that an apical aneurysm may be present. Would consider cardiac MRI for full evaluation of LV. Left ventricular diastolic parameters are consistent with Grade II diastolic dysfunction (pseudonormalization).  2. Right ventricular systolic function is normal. The right ventricular size is normal. There is normal pulmonary artery systolic pressure. The estimated right ventricular systolic pressure is 31.9 mmHg.  3. The mitral valve is normal in structure. Trivial mitral valve regurgitation. No evidence of mitral stenosis.  4. The aortic valve is tricuspid. Aortic valve regurgitation is not visualized. No aortic stenosis is present.  5. The inferior vena cava is normal in size with greater than 50% respiratory variability, suggesting right atrial pressure of 3 mmHg. FINDINGS  Left Ventricle: Left ventricular ejection fraction, by estimation, is 40 to 45%. The left ventricle has mildly decreased function. The left ventricle demonstrates regional wall motion abnormalities. Definity contrast agent was given IV to delineate the left ventricular endocardial  borders. The left ventricular internal cavity size was normal in size. There is no  left ventricular hypertrophy. Left ventricular diastolic parameters are consistent with Grade II diastolic dysfunction (pseudonormalization). Right Ventricle: The right ventricular size is normal. No increase in right ventricular wall thickness. Right ventricular systolic function is normal. There is normal pulmonary artery systolic pressure. The tricuspid regurgitant velocity is 2.69 m/s, and  with an assumed right atrial pressure of 3 mmHg, the estimated right ventricular systolic pressure is 31.9 mmHg. Left Atrium: Left atrial size was normal in size. Right Atrium: Right atrial size was normal in size. Pericardium: There is no evidence of pericardial effusion. Mitral Valve: The mitral valve is normal in structure. Trivial mitral valve regurgitation. No evidence of mitral valve stenosis. Tricuspid Valve: The tricuspid valve is normal in structure. Tricuspid valve regurgitation is trivial. Aortic Valve: The aortic valve is tricuspid. Aortic valve regurgitation is not visualized. No aortic stenosis is present. Aortic valve mean gradient measures 4.0 mmHg. Aortic valve peak gradient measures 6.7 mmHg. Aortic valve area, by VTI measures 2.24 cm. Pulmonic Valve: The pulmonic valve was normal in structure. Pulmonic valve regurgitation is not visualized. Aorta: The aortic root is normal in size and structure. Venous: The inferior vena cava is normal in size with greater than 50% respiratory variability, suggesting right atrial pressure of 3 mmHg. IAS/Shunts: No atrial level shunt detected by color flow Doppler.  LEFT VENTRICLE PLAX 2D LVIDd:         5.10 cm  Diastology LVIDs:         4.10 cm  LV e' medial:    6.31 cm/s LV PW:         1.00 cm  LV E/e' medial:  10.6 LV IVS:        0.90 cm  LV e' lateral:   7.18 cm/s LVOT diam:     1.90 cm  LV E/e' lateral: 9.3 LV SV:         43 LV SV Index:   22 LVOT Area:     2.84 cm  RIGHT VENTRICLE RV  Basal diam:  3.30 cm RV Mid diam:    2.00 cm RV S prime:     17.10 cm/s TAPSE (M-mode): 1.9 cm LEFT ATRIUM           Index       RIGHT ATRIUM           Index LA diam:      3.20 cm 1.66 cm/m  RA Area:     12.40 cm LA Vol (A2C): 43.9 ml 22.73 ml/m RA Volume:   26.70 ml  13.83 ml/m LA Vol (A4C): 24.4 ml 12.64 ml/m  AORTIC VALVE                   PULMONIC VALVE AV Area (Vmax):    2.18 cm    PV Vmax:       1.36 m/s AV Area (Vmean):   2.08 cm    PV Vmean:      96.350 cm/s AV Area (VTI):     2.24 cm    PV VTI:        0.233 m AV Vmax:           129.00 cm/s PV Peak grad:  7.3 mmHg AV Vmean:          87.300 cm/s PV Mean grad:  4.0 mmHg AV VTI:            0.190 m AV Peak Grad:      6.7 mmHg AV Mean Grad:  4.0 mmHg LVOT Vmax:         99.00 cm/s LVOT Vmean:        64.000 cm/s LVOT VTI:          0.150 m LVOT/AV VTI ratio: 0.79  AORTA Ao Root diam: 2.50 cm Ao Asc diam:  2.30 cm MITRAL VALVE               TRICUSPID VALVE MV Area (PHT): 6.27 cm    TR Peak grad:   28.9 mmHg MV Decel Time: 121 msec    TR Vmax:        269.00 cm/s MV E velocity: 66.70 cm/s MV A velocity: 60.70 cm/s  SHUNTS MV E/A ratio:  1.10        Systemic VTI:  0.15 m                            Systemic Diam: 1.90 cm Dalton McleanMD Electronically signed by Wilfred Lacy Signature Date/Time: 12/03/2020/2:59:02 PM    Final    US Abdomen Limited RUQ (LIVER/GB)  Result Date: 12/03/2020 CLINICAL DATA:  Abnormal LFTs EXAM: ULTRASOUND ABDOMEN LIMITED RIGHT UPPER QUADRANT COMPARISON:  Same day CT abdomen/pelvis FINDINGS: Gallbladder: No gallstones or wall thickening visualized. No sonographic Murphy sign noted by sonographer. Common bile duct: Diameter: 5 mm Liver: No focal lesion identified. Within normal limits in parenchymal echogenicity. Portal vein is patent on color Doppler imaging with normal direction of blood flow towards the liver. Other: None. IMPRESSION: Unremarkable right upper quadrant ultrasound. Electronically Signed   By: Lesia Hausen M.D.    On: 12/03/2020 13:34        Scheduled Meds:  vitamin C  500 mg Oral Daily   aspirin EC  81 mg Oral Daily   azithromycin  500 mg Oral Daily   enoxaparin (LOVENOX) injection  40 mg Subcutaneous Q24H   fluticasone  2 spray Each Nare Daily   folic acid  1 mg Oral Daily   insulin aspart  0-15 Units Subcutaneous TID WC   insulin aspart  0-5 Units Subcutaneous QHS   insulin glargine-yfgn  10 Units Subcutaneous QHS   Ipratropium-Albuterol  1 puff Inhalation TID   labetalol  100 mg Oral BID   linagliptin  5 mg Oral Daily   loratadine  10 mg Oral Daily   losartan  25 mg Oral Daily   multivitamin with minerals  1 tablet Oral Daily   pantoprazole  40 mg Oral Daily   polyethylene glycol  17 g Oral BID   senna  1 tablet Oral BID   thiamine  100 mg Oral Daily   Or   thiamine  100 mg Intravenous Daily   zinc sulfate  220 mg Oral Daily   Continuous Infusions:  sodium chloride 40 mL (12/05/20 1035)   cefTRIAXone (ROCEPHIN)  IV 2 g (12/04/20 1616)   remdesivir 100 mg in NS 100 mL 100 mg (12/05/20 1036)     LOS: 2 days    Time spent: 40 minutes    Ramiro Harvest, MD [facility]   To contact the attending provider between 7A-7P or the covering provider during after hours 7P-7A, please log into the web site [email or web address] and access using universal [place] password for that web site. If you do not have the password, please call the hospital operator.  12/05/2020, 11:52 AM

## 2020-12-05 NOTE — Plan of Care (Signed)
Patient ID: [name], male   DOB: 1975-04-29, 45 y.o.   MRN: [id number]     Problem: Education: Goal: Knowledge of General Education information will improve Description: Including pain rating scale, medication(s)/side effects and non-pharmacologic comfort measures Outcome: Progressing   Problem: Health Behavior/Discharge Planning: Goal: Ability to manage health-related needs will improve Outcome: Progressing   Problem: Clinical Measurements: Goal: Ability to maintain clinical measurements within normal limits will improve Outcome: Progressing Goal: Will remain free from infection Outcome: Progressing Goal: Diagnostic test results will improve Outcome: Progressing Goal: Respiratory complications will improve Outcome: Progressing Goal: Cardiovascular complication will be avoided Outcome: Progressing   Problem: Activity: Goal: Risk for activity intolerance will decrease Outcome: Progressing   Problem: Nutrition: Goal: Adequate nutrition will be maintained Outcome: Progressing   Problem: Coping: Goal: Level of anxiety will decrease Outcome: Progressing   Problem: Elimination: Goal: Will not experience complications related to bowel motility Outcome: Progressing Goal: Will not experience complications related to urinary retention Outcome: Progressing   Problem: Pain Managment: Goal: General experience of comfort will improve Outcome: Progressing   Problem: Safety: Goal: Ability to remain free from injury will improve Outcome: Progressing   Problem: Skin Integrity: Goal: Risk for impaired skin integrity will decrease Outcome: Progressing     Lidia Collum, RN

## 2020-12-06 DIAGNOSIS — I4891 Unspecified atrial fibrillation: Secondary | ICD-10-CM

## 2020-12-06 DIAGNOSIS — R778 Other specified abnormalities of plasma proteins: Secondary | ICD-10-CM

## 2020-12-06 DIAGNOSIS — I10 Essential (primary) hypertension: Secondary | ICD-10-CM

## 2020-12-06 LAB — CBC WITH DIFFERENTIAL/PLATELET
Abs Immature Granulocytes: 0.03 10*3/uL (ref 0.00–0.07)
Basophils Absolute: 0 10*3/uL (ref 0.0–0.1)
Basophils Relative: 0 %
Eosinophils Absolute: 0 10*3/uL (ref 0.0–0.5)
Eosinophils Relative: 0 %
HCT: 32.7 % — ABNORMAL LOW (ref 39.0–52.0)
Hemoglobin: 10.9 g/dL — ABNORMAL LOW (ref 13.0–17.0)
Immature Granulocytes: 0 %
Lymphocytes Relative: 21 %
Lymphs Abs: 1.9 10*3/uL (ref 0.7–4.0)
MCH: 31.1 pg (ref 26.0–34.0)
MCHC: 33.3 g/dL (ref 30.0–36.0)
MCV: 93.2 fL (ref 80.0–100.0)
Monocytes Absolute: 1.1 10*3/uL — ABNORMAL HIGH (ref 0.1–1.0)
Monocytes Relative: 12 %
Neutro Abs: 6 10*3/uL (ref 1.7–7.7)
Neutrophils Relative %: 67 %
Platelets: 291 10*3/uL (ref 150–400)
RBC: 3.51 MIL/uL — ABNORMAL LOW (ref 4.22–5.81)
RDW: 13.3 % (ref 11.5–15.5)
WBC: 9.1 10*3/uL (ref 4.0–10.5)
nRBC: 0 % (ref 0.0–0.2)

## 2020-12-06 LAB — COMPREHENSIVE METABOLIC PANEL
ALT: 24 U/L (ref 0–44)
AST: 30 U/L (ref 15–41)
Albumin: 2.7 g/dL — ABNORMAL LOW (ref 3.5–5.0)
Alkaline Phosphatase: 82 U/L (ref 38–126)
Anion gap: 8 (ref 5–15)
BUN: 11 mg/dL (ref 6–20)
CO2: 26 mmol/L (ref 22–32)
Calcium: 8 mg/dL — ABNORMAL LOW (ref 8.9–10.3)
Chloride: 99 mmol/L (ref 98–111)
Creatinine, Ser: 0.86 mg/dL (ref 0.61–1.24)
GFR, Estimated: >60 mL/min (ref >60)
Glucose, Bld: 335 mg/dL — ABNORMAL HIGH (ref 70–99)
Potassium: 3.9 mmol/L (ref 3.5–5.1)
Sodium: 133 mmol/L — ABNORMAL LOW (ref 135–145)
Total Bilirubin: 0.7 mg/dL (ref 0.3–1.2)
Total Protein: 5.6 g/dL — ABNORMAL LOW (ref 6.5–8.1)

## 2020-12-06 LAB — GLUCOSE, CAPILLARY
Glucose-Capillary: 168 mg/dL — ABNORMAL HIGH (ref 70–99)
Glucose-Capillary: 66 mg/dL — ABNORMAL LOW (ref 70–99)
Glucose-Capillary: 97 mg/dL (ref 70–99)
Glucose-Capillary: 100 mg/dL — ABNORMAL HIGH (ref 70–99)
Glucose-Capillary: 75 mg/dL (ref 70–99)
Glucose-Capillary: 337 mg/dL — ABNORMAL HIGH (ref 70–99)
Glucose-Capillary: 193 mg/dL — ABNORMAL HIGH (ref 70–99)
Glucose-Capillary: 39 mg/dL — CL (ref 70–99)
Glucose-Capillary: 132 mg/dL — ABNORMAL HIGH (ref 70–99)

## 2020-12-06 LAB — C-REACTIVE PROTEIN: CRP: 12.3 mg/dL — ABNORMAL HIGH (ref <1.0)

## 2020-12-06 LAB — LIPID PANEL
Cholesterol: 101 mg/dL (ref 0–200)
HDL: 62 mg/dL (ref >40)
LDL Cholesterol: 32 mg/dL (ref 0–99)
Total CHOL/HDL Ratio: 1.6 RATIO
Triglycerides: 34 mg/dL (ref <150)
VLDL: 7 mg/dL (ref 0–40)

## 2020-12-06 LAB — MAGNESIUM: Magnesium: 1.7 mg/dL (ref 1.7–2.4)

## 2020-12-06 LAB — D-DIMER, QUANTITATIVE: D-Dimer, Quant: 1.06 ug/mL-FEU — ABNORMAL HIGH (ref 0.00–0.50)

## 2020-12-06 LAB — PHOSPHORUS: Phosphorus: 2.5 mg/dL (ref 2.5–4.6)

## 2020-12-06 LAB — FERRITIN: Ferritin: 105 ng/mL (ref 24–336)

## 2020-12-06 MED ORDER — DEXTROSE 50 % IV SOLN
25.0000 g | INTRAVENOUS | Status: AC
  Administered 2020-12-06: 25 g via INTRAVENOUS

## 2020-12-06 MED ORDER — INSULIN ASPART 100 UNIT/ML IJ SOLN
0.0000 [IU] | Freq: Three times a day (TID) | INTRAMUSCULAR | Status: DC
  Administered 2020-12-07: 15 [IU] via SUBCUTANEOUS

## 2020-12-06 MED ORDER — INSULIN GLARGINE-YFGN 100 UNIT/ML ~~LOC~~ SOLN
15.0000 [IU] | Freq: Every day | SUBCUTANEOUS | Status: DC
  Filled 2020-12-06: qty 0.15

## 2020-12-06 MED ORDER — INSULIN ASPART 100 UNIT/ML IJ SOLN
5.0000 [IU] | Freq: Three times a day (TID) | INTRAMUSCULAR | Status: DC
  Administered 2020-12-06: 5 [IU] via SUBCUTANEOUS

## 2020-12-06 MED ORDER — INSULIN GLARGINE-YFGN 100 UNIT/ML ~~LOC~~ SOLN
10.0000 [IU] | Freq: Every day | SUBCUTANEOUS | Status: DC
  Administered 2020-12-06: 10 [IU] via SUBCUTANEOUS
  Filled 2020-12-06: qty 0.1

## 2020-12-06 MED ORDER — AMOXICILLIN-POT CLAVULANATE 875-125 MG PO TABS
1.0000 | ORAL_TABLET | Freq: Two times a day (BID) | ORAL | Status: DC
  Administered 2020-12-06 – 2020-12-07 (×3): 1 via ORAL
  Filled 2020-12-06 (×3): qty 1

## 2020-12-06 MED ORDER — DEXTROSE 50 % IV SOLN
INTRAVENOUS | Status: AC
  Administered 2020-12-06: 50 mL
  Filled 2020-12-06: qty 50

## 2020-12-06 MED ORDER — MAGNESIUM SULFATE 4 GM/100ML IV SOLN
4.0000 g | Freq: Once | INTRAVENOUS | Status: AC
  Administered 2020-12-06: 4 g via INTRAVENOUS
  Filled 2020-12-06: qty 100

## 2020-12-06 MED ORDER — GUAIFENESIN ER 600 MG PO TB12
1200.0000 mg | ORAL_TABLET | Freq: Two times a day (BID) | ORAL | Status: DC
  Administered 2020-12-06 – 2020-12-07 (×3): 1200 mg via ORAL
  Filled 2020-12-06 (×3): qty 2

## 2020-12-06 NOTE — Progress Notes (Signed)
Spoke with patient on the phone. States that he recently started back on insulin. Had been on 70/30 insulin and now on Lantus. Takes up to 30 units Lantus daily and sliding scale Novolog, as stated by patient. Starting to have sweating and feeling like he was having a low blood sugar when talking to him.   Will continue to monitor blood sugars while in the hospital.  Smith Mince RN BSN CDE Diabetes Coordinator Pager: [phone]  8am-5pm

## 2020-12-06 NOTE — Progress Notes (Signed)
Progress Note  Patient Name: [name] Date of Encounter: 12/06/2020  [facility] Cardiologist: None Dr. Graciela Husbands   Subjective   Feeling well. No chest pain, sob or palpitations.    Inpatient Medications    Scheduled Meds:  amoxicillin-clavulanate  1 tablet Oral Q12H   vitamin C  500 mg Oral Daily   aspirin EC  81 mg Oral Daily   azithromycin  500 mg Oral Daily   clotrimazole   Topical BID   enoxaparin (LOVENOX) injection  40 mg Subcutaneous Q24H   fluticasone  2 spray Each Nare Daily   folic acid  1 mg Oral Daily   guaiFENesin  1,200 mg Oral BID   insulin aspart  0-15 Units Subcutaneous TID WC   insulin aspart  0-5 Units Subcutaneous QHS   insulin aspart  5 Units Subcutaneous TID WC   insulin glargine-yfgn  15 Units Subcutaneous QHS   Ipratropium-Albuterol  1 puff Inhalation TID   labetalol  200 mg Oral BID   loratadine  10 mg Oral Daily   losartan  25 mg Oral Daily   multivitamin with minerals  1 tablet Oral Daily   pantoprazole  40 mg Oral Daily   thiamine  100 mg Oral Daily   Or   thiamine  100 mg Intravenous Daily   zinc sulfate  220 mg Oral Daily   Continuous Infusions:  sodium chloride 250 mL (12/05/20 1651)   magnesium sulfate bolus IVPB     remdesivir 100 mg in NS 100 mL 100 mg (12/05/20 1036)   PRN Meds: sodium chloride, acetaminophen **OR** acetaminophen, chlorpheniramine-HYDROcodone, guaiFENesin-dextromethorphan, LORazepam **OR** LORazepam, naLOXone (NARCAN)  injection, ondansetron **OR** ondansetron (ZOFRAN) IV   Vital Signs    Vitals:   12/05/20 1046 12/05/20 1225 12/05/20 2020 12/06/20 0448  BP: 133/74 (!) 143/94 (!) 158/94 129/76  Pulse: 99 (!) 110 (!) 103 (!) 103  Resp:  19 14 14   Temp:  99.4 F (37.4 C) 98.8 F (37.1 C) 99.2 F (37.3 C)  TempSrc:  Oral Oral   SpO2:  100% 100% 98%  Weight:      Height:        Intake/Output Summary (Last 24 hours) at 12/06/2020 0926 Last data filed at 12/05/2020 1800 Gross per 24 hour  Intake  1448.77 ml  Output --  Net 1448.77 ml   Last 3 Weights 12/03/2020 11/23/2020 03/10/2019  Weight (lbs) 175 lb 140 lb 130 lb 3.2 oz  Weight (kg) 79.379 kg 63.504 kg 59.058 kg      Telemetry    Sinus rhythm/tachycardia at rate of 90-100s- Personally Reviewed  ECG    N/A  Physical Exam   GEN: No acute distress.   Neck: No JVD Cardiac: RRR, no murmurs, rubs, or gallops.  Respiratory: Clear to auscultation bilaterally. GI: Soft, nontender, non-distended  MS: No edema; No deformity. Neuro:  Nonfocal  Psych: Normal affect   Labs    High Sensitivity Troponin:   Recent Labs  Lab 12/03/20 0534 12/03/20 0850 12/04/20 0815 12/04/20 1124  TROPONINIHS 35* 106* 113* 95*      Chemistry Recent Labs  Lab 12/04/20 0540 12/05/20 0337 12/06/20 0330  NA 140 139 133*  K 4.0 3.5 3.9  CL 106 105 99  CO2 27 25 26   GLUCOSE 75 92 335*  BUN 18 12 11   CREATININE 0.95 0.77 0.86  CALCIUM 8.4* 8.3* 8.0*  PROT 7.1 6.3* 5.6*  ALBUMIN 3.3* 3.2* 2.7*  AST 39 49* 30  ALT 33  31 24  ALKPHOS 94 89 82  BILITOT 0.4 0.4 0.7  GFRNONAA >60 >60 >60  ANIONGAP 7 9 8      Hematology Recent Labs  Lab 12/04/20 0540 12/05/20 0337 12/06/20 0330  WBC 12.3* 8.3 9.1  RBC 3.80* 3.67* 3.51*  HGB 11.8* 11.3* 10.9*  HCT 36.9* 35.4* 32.7*  MCV 97.1 96.5 93.2  MCH 31.1 30.8 31.1  MCHC 32.0 31.9 33.3  RDW 14.0 13.6 13.3  PLT 292 276 291      DDimer  Recent Labs  Lab 12/04/20 0540 12/05/20 0337 12/06/20 0330  DDIMER 1.24* 0.96* 1.06*     Radiology    No results found.  Cardiac Studies   Echo 12/03/2020 1. Left ventricular ejection fraction, by estimation, is 40 to 45%. The  left ventricle has mildly decreased function. The left ventricle  demonstrates regional wall motion abnormalities with septal hypokinesis.  The apex is not well-visualized, but I am  concerned that an apical aneurysm may be present. Would consider cardiac  MRI for full evaluation of LV. Left ventricular diastolic  parameters are  consistent with Grade II diastolic dysfunction (pseudonormalization).   2. Right ventricular systolic function is normal. The right ventricular  size is normal. There is normal pulmonary artery systolic pressure. The  estimated right ventricular systolic pressure is 31.9 mmHg.   3. The mitral valve is normal in structure. Trivial mitral valve  regurgitation. No evidence of mitral stenosis.   4. The aortic valve is tricuspid. Aortic valve regurgitation is not  visualized. No aortic stenosis is present.   5. The inferior vena cava is normal in size with greater than 50%  respiratory variability, suggesting right atrial pressure of 3 mmHg.   Patient Profile     45 y.o. male with history of hypertension and diabetes admitted for AMS 2nd to opioid overdose.  He was found in respiratory arrest requiring CPR by wife.  Recovered after Narcan by EMS.  Found to have sepsis secondary to COVID pneumonia. Had brief episode of A. fib in ER with spontaneous conversion.  Assessment & Plan    Elevated troponin -High-sensitivity troponin 35>> 106>> 113>> 95 -Felt demand in setting of acute presentation -Continue aspirin and statin  2.  Acute systolic heart failure with questionable apical aneurysm -Echocardiogram showed mildly depressed LV function at 40 to 45% with septal hypokinesis and possible apical aneurysm. -Dr. 57 recommended outpatient cardiac MRI once recovered -Not on acute exacerbation/euvolemic on exam -Continue labetalol 200 mg twice daily (? Change to coreg) and losartan 25 mg daily  3. HTN - BP stable on current medications   Otherwise per primary team   For questions or updates, please contact [facility] Please consult [email or web address] for contact info under        SignedGraciela Husbands, PA  12/06/2020, 9:26 AM

## 2020-12-06 NOTE — Progress Notes (Signed)
PROGRESS NOTE    [name]  UMP:[id number] DOB: Oct 07, 1975 DOA: 12/03/2020 PCP: Pcp, No (Confirm with patient/family/NH records and if not entered, this HAS to be entered at [facility] point of entry. "No PCP" if truly none.)   Chief Complaint  Patient presents with   Altered Mental Status    Brief Narrative: 45 year old gentleman history of type 1 diabetes, depression, hypertension, prior history of polysubstance abuse presented to the ED with altered mental status noted to have been found unresponsive around 3 AM with substance treatment from his mouth.  Wife started compressions and called EMS.  When EMS arrived patient was administered Narcan and started to wake up.  Patient transported to the ED found to be hypothermic with tachycardia, elevated lactic acid level, CT angiogram chest with concern for pneumonia.  COVID-19 PCR positive.  Sepsis protocol initiated.  Cardiac enzymes elevated.  2D echo noted to have a depressed EF with wall motion abnormalities.  Patient admitted placed on IV antibiotics, cardiology consulted.   Assessment & Plan:   Principal Problem:   Acute metabolic encephalopathy Active Problems:   Sepsis (HCC)   Substance abuse (HCC)   Alcohol dependence (HCC)   Tobacco abuse   COVID-19   Pneumonia   Transaminitis   Essential hypertension   Abnormal echocardiogram   Elevated troponin   Elevated LFTs   New onset a-fib (HCC)   Polysubstance abuse (HCC)  1 acute metabolic encephalopathy/polysubstance abuse/overdose on opioid -Patient admitted to progressive care unit as he had presented with unresponsiveness which improved after administration of Narcan when EMS presented to patient's home. -Patient denies any opioid use.  UDS done positive for opiates and THC. -Patient counseled on further drug use. -Head CT done negative for any acute abnormalities. -CT angiogram chest done negative for PE however concerning for bibasilar infiltrate/pneumonia. -Clinical  improvement and likely close to baseline. -IV antibiotics transitioned to oral antibiotics.   -Supportive care.    2.  Sepsis secondary to pneumonia, POA -Patient presented with criteria for sepsis with a sinus tachycardia, tachypnea, elevated lactic acid level, hypothermia. -CT angiogram chest done negative for PE however concerning for bibasilar pneumonia. -CT abdomen and pelvis consistent with bibasilar pneumonia. -Urinalysis nitrite negative, leukocytes negative. -Blood cultures pending with no growth to date. -Afebrile. -Concern for possible aspiration. -SLP evaluation pending. -Transition from IV Rocephin to Augmentin to complete course of antibiotic treatment.   -Continue oral azithromycin, Combivent, Flonase, PPI, Claritin.  -Supportive care.    3.  Elevated troponin/abnormal 2D echo/cardiomyopathy with ? apical aneurysm -Patient noted to have elevated troponin felt likely secondary due to demand ischemia. -Patient denies any chest pain. -2D echo done with EF of 40 to 45%, WMA, grade 2 diastolic dysfunction, concern for apical aneurysm. -Fasting lipid panel with LDL of 32.  -Continue aspirin, ARB, beta-blocker. -Cardiology consulted and currently recommending outpatient cardiac MRI, addition of statin prior to discharge. -Polysubstance cessation stressed to patient. -We will need close outpatient follow-up with cardiology. -Per cardiology.  4.  Hypertension -Continue ARB, labetalol.  5.  COVID-19 infection -Improvement with respiratory status currently with sats of 97% on room air.   -Continue IV remdesivir, Combivent, vitamin C, zinc.   -Continue Combivent, Claritin, PPI, Flonase.   -Airborne isolation and discontinue isolation after 10 days.   6.  Poorly controlled diabetes mellitus type 1 -Hemoglobin A1c 12.4 (11/23/2020) -CBG 337 this morning.  -Tradjenta has been discontinued. -Semglee 10 units daily which we will continue. -Continue sliding scale  insulin. -Discontinue meal coverage  NovoLog as patient noted to have a low of 39 today. -May need to further decrease long-acting insulin. -Diabetes coordinator following.  7.  Constipation -Resolved on laxatives of MiraLAX and Senokot-S.  -Status post sorbitol and Reglan.  -Laxatives discontinued.    8.  History of alcohol abuse -Patient with no signs of significant alcohol withdrawal. -Continue Ativan withdrawal protocol, thiamine, folic acid, multivitamin.  9.  Transaminitis -Likely secondary to problem #1. -Acute hepatitis panel negative. -Right upper quadrant abdominal ultrasound unremarkable. -LFTs trended down and transaminitis resolved.   -Outpatient follow-up.    10.  Tobacco abuse -Tobacco cessation stressed to patient.  11.  ?Transient A. fib -Per cardiology it was noted that patient had a transient A. fib when EMS got there and subsequently now in normal sinus rhythm. -Labetalol.   -Per cardiology.   12.  Rash -Patient with rash on upper back, likely a contact dermatitis from patient's hair versus tinea. -Continue trial of Lotrimin twice daily.      DVT prophylaxis: Lovenox Code Status: Full Family Communication: Updated patient. No family at bedside.   Disposition:   Status is: Inpatient  Remains inpatient appropriate because:Inpatient level of care appropriate due to severity of illness  Dispo: The patient is from: Home              Anticipated d/c is to: Home              Patient currently is not medically stable to d/c.   Difficult to place patient No       Consultants:  Cardiology: Dr. Graciela Husbands 12/04/2020  Procedures:  CT head 12/03/2020 CT angiogram chest 12/03/2020 CT abdomen and pelvis 12/03/2020 Chest x-ray 12/03/2020 Abdominal ultrasound 12/03/2020 2D echo 12/03/2020  Antimicrobials:  IV remdesivir 12/03/2020>>>> IV Rocephin 12/03/2020>>> 12/06/2020 IV azithromycin 12/03/2020>>> oral azithromycin 12/05/2020>>>> Augmentin  12/06/2020>>>>   Subjective: Laying in bed.  Denies chest pain.  States shortness of breath with exertion is improving.  Having bowel movement.  Feels upper abdominal pain may be secondary to CPR.  Overall feeling better.  Stated had a snack yesterday evening and as such his blood sugars elevated this morning.    Objective: Vitals:   12/05/20 1225 12/05/20 2020 12/06/20 0448 12/06/20 0949  BP: (!) 143/94 (!) 158/94 129/76 136/89  Pulse: (!) 110 (!) 103 (!) 103 97  Resp: 19 14 14    Temp: 99.4 F (37.4 C) 98.8 F (37.1 C) 99.2 F (37.3 C)   TempSrc: Oral Oral    SpO2: 100% 100% 98%   Weight:      Height:        Intake/Output Summary (Last 24 hours) at 12/06/2020 1117 Last data filed at 12/05/2020 1800 Gross per 24 hour  Intake 788.77 ml  Output --  Net 788.77 ml    Filed Weights   12/03/20 0541  Weight: 79.4 kg    Examination:  General exam: : NAD Respiratory system: CTAB.  No wheezes, no rhonchi.  Speaking in full sentences.  Normal respiratory effort. Cardiovascular system: Regular rate and rhythm no murmurs rubs or gallops.  No JVD.  No lower extremity edema.  Gastrointestinal system: Abdomen soft, nontender, nondistended, positive bowel sounds.  No rebound.  No guarding. Central nervous system: Alert and oriented. No focal neurological deficits. Extremities: Symmetric 5 x 5 power. Skin: No rashes, lesions or ulcers Psychiatry: Judgement and insight appear normal. Mood & affect appropriate.  Data Reviewed: I have personally reviewed following labs and imaging studies  CBC: Recent Labs  Lab 12/03/20 0534 12/03/20 1434 12/04/20 0540 12/05/20 0337 12/06/20 0330  WBC 17.7* 14.2* 12.3* 8.3 9.1  NEUTROABS 13.7*  --  10.4* 6.1 6.0  HGB 12.8* 12.0* 11.8* 11.3* 10.9*  HCT 41.6 37.8* 36.9* 35.4* 32.7*  MCV 101.2* 98.7 97.1 96.5 93.2  PLT 385 320 292 276 291     Basic Metabolic Panel: Recent Labs  Lab 12/03/20 0534 12/03/20 1434 12/04/20 0540 12/05/20 0337  12/06/20 0330  NA 137  --  140 139 133*  K 5.0  --  4.0 3.5 3.9  CL 101  --  106 105 99  CO2 22  --  27 25 26   GLUCOSE 261*  --  75 92 335*  BUN 20  --  18 12 11   CREATININE 1.23 1.13 0.95 0.77 0.86  CALCIUM 8.9  --  8.4* 8.3* 8.0*  MG  --   --   --  1.9 1.7  PHOS  --   --   --  2.7 2.5     GFR: Estimated Creatinine Clearance: 102.7 mL/min (by C-G formula based on SCr of 0.86 mg/dL).  Liver Function Tests: Recent Labs  Lab 12/03/20 0534 12/04/20 0540 12/05/20 0337 12/06/20 0330  AST 88* 39 49* 30  ALT 48* 33 31 24  ALKPHOS 117 94 89 82  BILITOT 0.4 0.4 0.4 0.7  PROT 7.9 7.1 6.3* 5.6*  ALBUMIN 4.0 3.3* 3.2* 2.7*     CBG: Recent Labs  Lab 12/05/20 0827 12/05/20 1222 12/05/20 1645 12/05/20 2018 12/06/20 0842  GLUCAP 156* 75 188* 258* 337*      Recent Results (from the past 240 hour(s))  Blood culture (routine single)     Status: None (Preliminary result)   Collection Time: 12/03/20  5:59 AM   Specimen: BLOOD  Result Value Ref Range Status   Specimen Description   Final    BLOOD RIGHT ANTECUBITAL Performed at [facility], [address], [place], [place]    Special Requests   Final    BOTTLES DRAWN AEROBIC AND ANAEROBIC Blood Culture adequate volume Performed at [facility], [address], [place], [place]    Culture   Final    NO GROWTH 3 DAYS Performed at [facility], [address] [place], [address] [place]    Report Status PENDING  Incomplete  Resp Panel by RT-PCR (Flu A&B, Covid) Nasopharyngeal Swab     Status: Abnormal   Collection Time: 12/03/20  6:30 AM   Specimen: Nasopharyngeal Swab; Nasopharyngeal(NP) swabs in vial transport medium  Result Value Ref Range Status   SARS Coronavirus 2 by RT PCR POSITIVE (A) NEGATIVE Final    Comment: RESULT CALLED TO, READ BACK BY AND VERIFIED WITH: GARRISON,G. RN @0817  ON 8.19.2022 BY NMCCOY (NOTE) SARS-CoV-2 target nucleic acids are  DETECTED.  The SARS-CoV-2 RNA is generally detectable in upper respiratory specimens during the acute phase of infection. Positive results are indicative of the presence of the identified virus, but do not rule out bacterial infection or co-infection with other pathogens not detected by the test. Clinical correlation with patient history and other diagnostic information is necessary to determine patient infection status. The expected result is Negative.  Fact Sheet for Patients: 12/05/20  Fact Sheet for Healthcare Providers:  This test is not yet approved or cleared by the 8.21.2022 FDA and  has been authorized for detection and/or diagnosis of SARS-CoV-2 by FDA under an Emergency Use Authorization (EUA).  This EUA will remain in effect (meaning this t est can be used) for the duration of  the COVID-19 declaration under Section 564(b)(1) of the Act, 21 U.S.C. section 360bbb-3(b)(1), unless the authorization is terminated or revoked sooner.     Influenza A by PCR NEGATIVE NEGATIVE Final   Influenza B by PCR NEGATIVE NEGATIVE Final    Comment: (NOTE) The Xpert Xpress SARS-CoV-2/FLU/RSV plus assay is intended as an aid in the diagnosis of influenza from Nasopharyngeal swab specimens and should not be used as a sole basis for treatment. Nasal washings and aspirates are unacceptable for Xpert Xpress SARS-CoV-2/FLU/RSV testing.  Fact Sheet for Patients: BloggerCourse.com  Fact Sheet for Healthcare Providers: SeriousBroker.it  This test is not yet approved or cleared by the [place] FDA and has been authorized for detection and/or diagnosis of SARS-CoV-2 by FDA under an Emergency Use Authorization (EUA). This EUA will remain in effect (meaning this test can be used) for the duration of the COVID-19 declaration under Section 564(b)(1) of the Act, 21  U.S.C. section 360bbb-3(b)(1), unless the authorization is terminated or revoked.  Performed at [facility], [address] [place], [place] [address]           Radiology Studies: No results found.      Scheduled Meds:  amoxicillin-clavulanate  1 tablet Oral Q12H   vitamin C  500 mg Oral Daily   aspirin EC  81 mg Oral Daily   azithromycin  500 mg Oral Daily   clotrimazole   Topical BID   enoxaparin (LOVENOX) injection  40 mg Subcutaneous Q24H   fluticasone  2 spray Each Nare Daily   folic acid  1 mg Oral Daily   guaiFENesin  1,200 mg Oral BID   insulin aspart  0-15 Units Subcutaneous TID WC   insulin aspart  0-5 Units Subcutaneous QHS   insulin aspart  5 Units Subcutaneous TID WC   insulin glargine-yfgn  15 Units Subcutaneous QHS   Ipratropium-Albuterol  1 puff Inhalation TID   labetalol  200 mg Oral BID   loratadine  10 mg Oral Daily   losartan  25 mg Oral Daily   multivitamin with minerals  1 tablet Oral Daily   pantoprazole  40 mg Oral Daily   thiamine  100 mg Oral Daily   Or   thiamine  100 mg Intravenous Daily   zinc sulfate  220 mg Oral Daily   Continuous Infusions:  sodium chloride 250 mL (12/05/20 1651)   magnesium sulfate bolus IVPB 4 g (12/06/20 1107)   remdesivir 100 mg in NS 100 mL 100 mg (12/06/20 1004)     LOS: 3 days    Time spent: 40 minutes    Ramiro Harvest, MD [facility]   To contact the attending provider between 7A-7P or the covering provider during after hours 7P-7A, please log into the web site [email or web address] and access using universal [place] password for that web site. If you do not have the password, please call the hospital operator.  12/06/2020, 11:17 AM

## 2020-12-07 ENCOUNTER — Other Ambulatory Visit (HOSPITAL_COMMUNITY): Payer: Self-pay | Admitting: Emergency Medicine

## 2020-12-07 DIAGNOSIS — J9601 Acute respiratory failure with hypoxia: Secondary | ICD-10-CM

## 2020-12-07 DIAGNOSIS — I253 Aneurysm of heart: Secondary | ICD-10-CM

## 2020-12-07 DIAGNOSIS — R931 Abnormal findings on diagnostic imaging of heart and coronary circulation: Secondary | ICD-10-CM

## 2020-12-07 LAB — COMPREHENSIVE METABOLIC PANEL
ALT: 25 U/L (ref 0–44)
AST: 26 U/L (ref 15–41)
Albumin: 3.3 g/dL — ABNORMAL LOW (ref 3.5–5.0)
Alkaline Phosphatase: 103 U/L (ref 38–126)
Anion gap: 13 (ref 5–15)
BUN: 17 mg/dL (ref 6–20)
CO2: 23 mmol/L (ref 22–32)
Calcium: 8.6 mg/dL — ABNORMAL LOW (ref 8.9–10.3)
Chloride: 97 mmol/L — ABNORMAL LOW (ref 98–111)
Creatinine, Ser: 1.09 mg/dL (ref 0.61–1.24)
GFR, Estimated: >60 mL/min (ref >60)
Glucose, Bld: 468 mg/dL — ABNORMAL HIGH (ref 70–99)
Potassium: 4.9 mmol/L (ref 3.5–5.1)
Sodium: 133 mmol/L — ABNORMAL LOW (ref 135–145)
Total Bilirubin: 0.9 mg/dL (ref 0.3–1.2)
Total Protein: 6.5 g/dL (ref 6.5–8.1)

## 2020-12-07 LAB — CBC WITH DIFFERENTIAL/PLATELET
Abs Immature Granulocytes: 0.04 10*3/uL (ref 0.00–0.07)
Basophils Absolute: 0 10*3/uL (ref 0.0–0.1)
Basophils Relative: 0 %
Eosinophils Absolute: 0.1 10*3/uL (ref 0.0–0.5)
Eosinophils Relative: 1 %
HCT: 35.2 % — ABNORMAL LOW (ref 39.0–52.0)
Hemoglobin: 11.7 g/dL — ABNORMAL LOW (ref 13.0–17.0)
Immature Granulocytes: 0 %
Lymphocytes Relative: 16 %
Lymphs Abs: 1.5 10*3/uL (ref 0.7–4.0)
MCH: 30.7 pg (ref 26.0–34.0)
MCHC: 33.2 g/dL (ref 30.0–36.0)
MCV: 92.4 fL (ref 80.0–100.0)
Monocytes Absolute: 0.9 10*3/uL (ref 0.1–1.0)
Monocytes Relative: 9 %
Neutro Abs: 7 10*3/uL (ref 1.7–7.7)
Neutrophils Relative %: 74 %
Platelets: 310 10*3/uL (ref 150–400)
RBC: 3.81 MIL/uL — ABNORMAL LOW (ref 4.22–5.81)
RDW: 13.1 % (ref 11.5–15.5)
WBC: 9.4 10*3/uL (ref 4.0–10.5)
nRBC: 0 % (ref 0.0–0.2)

## 2020-12-07 LAB — C-REACTIVE PROTEIN: CRP: 11.9 mg/dL — ABNORMAL HIGH (ref <1.0)

## 2020-12-07 LAB — VITAMIN B1: Vitamin B1 (Thiamine): 130.6 nmol/L (ref 66.5–200.0)

## 2020-12-07 LAB — GLUCOSE, CAPILLARY
Glucose-Capillary: 425 mg/dL — ABNORMAL HIGH (ref 70–99)
Glucose-Capillary: 432 mg/dL — ABNORMAL HIGH (ref 70–99)
Glucose-Capillary: 441 mg/dL — ABNORMAL HIGH (ref 70–99)
Glucose-Capillary: 98 mg/dL (ref 70–99)

## 2020-12-07 LAB — FERRITIN: Ferritin: 145 ng/mL (ref 24–336)

## 2020-12-07 LAB — PHOSPHORUS: Phosphorus: 2.8 mg/dL (ref 2.5–4.6)

## 2020-12-07 LAB — MAGNESIUM: Magnesium: 2.1 mg/dL (ref 1.7–2.4)

## 2020-12-07 LAB — D-DIMER, QUANTITATIVE: D-Dimer, Quant: 0.71 ug/mL-FEU — ABNORMAL HIGH (ref 0.00–0.50)

## 2020-12-07 MED ORDER — LABETALOL HCL 200 MG PO TABS: 200.0000 mg | ORAL_TABLET | Freq: Two times a day (BID) | ORAL | 1 refills | Status: DC

## 2020-12-07 MED ORDER — ADULT MULTIVITAMIN W/MINERALS CH: 1.0000 | ORAL_TABLET | Freq: Every day | ORAL | Status: DC

## 2020-12-07 MED ORDER — AMOXICILLIN-POT CLAVULANATE 875-125 MG PO TABS: 1.0000 | ORAL_TABLET | Freq: Two times a day (BID) | ORAL | 0 refills | Status: AC

## 2020-12-07 MED ORDER — GUAIFENESIN ER 600 MG PO TB12: 1200.0000 mg | ORAL_TABLET | Freq: Two times a day (BID) | ORAL | 0 refills | Status: AC

## 2020-12-07 MED ORDER — IPRATROPIUM-ALBUTEROL 20-100 MCG/ACT IN AERS: 1.0000 | INHALATION_SPRAY | Freq: Four times a day (QID) | RESPIRATORY_TRACT | 0 refills | Status: DC | PRN

## 2020-12-07 MED ORDER — ASCORBIC ACID 500 MG PO TABS: 500.0000 mg | ORAL_TABLET | Freq: Every day | ORAL | Status: DC

## 2020-12-07 MED ORDER — INSULIN GLARGINE-YFGN 100 UNIT/ML ~~LOC~~ SOLN
15.0000 [IU] | Freq: Every day | SUBCUTANEOUS | Status: DC
  Filled 2020-12-07: qty 0.15

## 2020-12-07 MED ORDER — INSULIN ASPART 100 UNIT/ML IJ SOLN: 0.0000 [IU] | INTRAMUSCULAR | Status: DC

## 2020-12-07 MED ORDER — FOLIC ACID 1 MG PO TABS: 1.0000 mg | ORAL_TABLET | Freq: Every day | ORAL | Status: DC

## 2020-12-07 MED ORDER — PANTOPRAZOLE SODIUM 40 MG PO TBEC: 40.0000 mg | DELAYED_RELEASE_TABLET | Freq: Every day | ORAL | 1 refills | Status: DC

## 2020-12-07 MED ORDER — ASPIRIN 81 MG PO TBEC: 81.0000 mg | DELAYED_RELEASE_TABLET | Freq: Every day | ORAL | 0 refills | Status: DC

## 2020-12-07 MED ORDER — LOSARTAN POTASSIUM 25 MG PO TABS: 25.0000 mg | ORAL_TABLET | Freq: Every day | ORAL | 1 refills | Status: DC

## 2020-12-07 MED ORDER — THIAMINE HCL 100 MG PO TABS: 100.0000 mg | ORAL_TABLET | Freq: Every day | ORAL | Status: DC

## 2020-12-07 MED ORDER — INSULIN GLARGINE-YFGN 100 UNIT/ML ~~LOC~~ SOLN
5.0000 [IU] | Freq: Once | SUBCUTANEOUS | Status: AC
  Administered 2020-12-07: 5 [IU] via SUBCUTANEOUS
  Filled 2020-12-07: qty 0.05

## 2020-12-07 MED ORDER — ZINC SULFATE 220 (50 ZN) MG PO CAPS: 220.0000 mg | ORAL_CAPSULE | Freq: Every day | ORAL | Status: DC

## 2020-12-07 MED ORDER — LORATADINE 10 MG PO TABS
10.0000 mg | ORAL_TABLET | Freq: Every day | ORAL | 0 refills | Status: DC
Start: 2020-12-08 — End: 2021-03-01

## 2020-12-07 MED ORDER — INSULIN ASPART 100 UNIT/ML IJ SOLN
20.0000 [IU] | Freq: Once | INTRAMUSCULAR | Status: AC
  Administered 2020-12-07: 20 [IU] via SUBCUTANEOUS

## 2020-12-07 MED ORDER — FLUTICASONE PROPIONATE 50 MCG/ACT NA SUSP: 2.0000 | Freq: Every day | NASAL | 0 refills | Status: DC

## 2020-12-07 NOTE — TOC Progression Note (Signed)
Transition of Care [facility]) - Progression Note    Patient Details  Name: Jerrold Haskell MRN: [id number] Date of Birth: 07/23/1975  [facility]) CM/SW Contact  Geni Bers, RN Phone Number: 12/07/2020, 12:59 PM  Clinical Narrative:     Pt is active with [facility] [employer].   Expected Discharge Plan: Home/Self Care Barriers to Discharge: No Barriers Identified  Expected Discharge Plan and Services Expected Discharge Plan: Home/Self Care   Discharge Planning Services: Other - See comment ([facility])   Living arrangements for the past 2 months: Single Family Home                                       Social Determinants of Health (SDOH) Interventions    Readmission Risk Interventions No flowsheet data found.

## 2020-12-07 NOTE — Progress Notes (Signed)
Pt discharged to home, prescription followup reviewed with pt and spouse, follow up appointment reviewed with pt. Pt blood final 107. No s/s meal coverage required as pt plan to eat a late dinner. Pt given a snack earlier for CBG of 98. Pt reviewed COVID home care and acknowledged understanding. SRP, RN

## 2020-12-07 NOTE — Plan of Care (Signed)
  Problem: Education: Goal: Knowledge of General Education information will improve Description: Including pain rating scale, medication(s)/side effects and non-pharmacologic comfort measures 12/07/2020 1604 by Charmian Muff, RN Outcome: Progressing 12/07/2020 1501 by Charmian Muff, RN Outcome: Progressing   Problem: Health Behavior/Discharge Planning: Goal: Ability to manage health-related needs will improve 12/07/2020 1604 by Charmian Muff, RN Outcome: Progressing 12/07/2020 1501 by Charmian Muff, RN Outcome: Progressing   Problem: Clinical Measurements: Goal: Ability to maintain clinical measurements within normal limits will improve Outcome: Progressing Goal: Will remain free from infection Outcome: Progressing

## 2020-12-07 NOTE — Plan of Care (Signed)

## 2020-12-07 NOTE — Discharge Summary (Signed)
Physician Discharge Summary  [name] JGO:[id number] DOB: [birth year] DOA: 12/03/2020  PCP: Pcp, No  Admit date: 12/03/2020 Discharge date: 12/07/2020  Time spent: 55 minutes  Recommendations for Outpatient Follow-up:  Follow-up with Dr. Caryl Comes cardiology in 2 weeks. Follow-up with PCP in 2 weeks.  On follow-up patient will need a basic metabolic profile done to follow-up on electrolytes and renal function.  Patient's diabetes will need to be reassessed.  Patient blood pressure also need to be reassessed.   Discharge Diagnoses:  Principal Problem:   Acute metabolic encephalopathy Active Problems:   Sepsis ([place])   Substance abuse ([place])   Alcohol dependence ([place])   Tobacco abuse   COVID-19   Pneumonia   Transaminitis   Essential hypertension   Abnormal echocardiogram   Elevated troponin   Elevated LFTs   New onset a-fib ([place])   Polysubstance abuse ([place])   Discharge Condition: Stable and improved  Diet recommendation: Carb modified diet  Filed Weights   12/03/20 0541  Weight: 79.4 kg    History of present illness:  HPI per Dr. Marylyn Ishihara Trisha Metsker is a 45 y.o. male with medical history significant of DM1, depression, HTN. Presenting with AMS. History is from wife at bedside. She reported that the patient was his normal self when she saw him around 10p last night. Prior to last night, he had complaints of intermittent dizziness for a couple of days, but seemed fine. She found him this morning around 3a in his room. He had some substance streaming from his mouth and he breathing was barely audible. She tried to wake him up, but he was not responsive. She was having trouble hearing him breathe. She called EMS and started compressions on him. She is not sure of how long she was doing compressions, but EMS did arrive and administer narcan. He began to wake up. He was transported to the ED for further eval. She denies any other aggravating or alleviating factors.     ED Course:  He was found to be hypothermic w/ tachycardia and elevated lactic acid. CTA PE showed possible PNA. He was COVID positive. Sepsis protocol was initiated. TRH was called for admission.    Hospital Course:  1 acute metabolic encephalopathy/polysubstance abuse/overdose on opioid -Patient admitted to progressive care unit as he had presented with unresponsiveness which improved after administration of Narcan when EMS presented to patient's home. -Patient denies any opioid use.  UDS done positive for opiates and THC. -Patient counseled on further drug use. -Head CT done negative for any acute abnormalities. -CT angiogram chest done negative for PE however concerning for bibasilar infiltrate/pneumonia. -Patient treated with antibiotics for suspected pneumonia.   -Patient improved clinically and was baseline by day of discharge.   -Outpatient follow-up.    2.  Sepsis secondary to pneumonia, POA -Patient presented with criteria for sepsis with a sinus tachycardia, tachypnea, elevated lactic acid level, hypothermia. -CT angiogram chest done negative for PE however concerning for bibasilar pneumonia. -CT abdomen and pelvis consistent with bibasilar pneumonia. -Urinalysis nitrite negative, leukocytes negative. -Blood cultures pending with no growth to date. -Afebrile. -Concern for possible aspiration. -SLP evaluation pending. -Patient initially placed on IV Rocephin and azithromycin and subsequently transition to oral azithromycin and Augmentin.  -Patient was discharged home on 2 more days of Augmentin to complete a 5-day course of treatment.   -Patient was no longer hypoxic by day of discharge.   -Outpatient follow-up with PCP.    3.  Elevated troponin/abnormal 2D echo/cardiomyopathy with ?  apical aneurysm -Patient noted to have elevated troponin felt likely secondary due to demand ischemia. -Patient denied any chest pain. -2D echo done with EF of 40 to 45%, WMA, grade 2 diastolic dysfunction,  concern for apical aneurysm. -Fasting lipid panel with LDL of 32.  -Patient was placed on aspirin, ARB, beta-blocker.  -Cardiology consulted and currently recommending outpatient cardiac MRI, addition of statin prior to discharge. -LDL noted to be at 32 and as such patient not started on statin on discharge. -Polysubstance cessation stressed to patient. -Close outpatient follow-up with cardiology.   4.  Hypertension -Blood pressure was controlled on ARB and labetalol which was started during this hospitalization.   -Outpatient follow-up with PCP.    5.  COVID-19 infection -Improvement with respiratory status and was no longer hypoxic with sats of 100% on room air by day of discharge  -Patient status post full course of IV remdesivir.   -Patient also placed on Combivent, vitamin C, zinc.   -Patient be discharged home in stable and improved condition and will need to quarantine until 12/14/2020 to complete a 10-day course.    6.  Poorly controlled diabetes mellitus type 1 -Hemoglobin A1c 12.4 (11/23/2020) -Patient noted to have blood sugars that range from low to high.   -Patient initially placed on Tradjenta which was subsequently discontinued.   -Patient's long-acting insulin of Semglee was adjusted from 15 units to 10 units however due to elevated blood sugars in the 400s was increased back to 15 units daily.   -Patient maintained on sliding scale insulin.   -New coverage initially placed however was discontinued when patient noted to have hypoglycemic spell.   -Blood sugars were better controlled by day of discharge.   -Outpatient follow-up with PCP.   7.  Constipation -Resolved on laxatives of MiraLAX and Senokot-S.  -Status post sorbitol and Reglan.  -Laxatives discontinued.    8.  History of alcohol abuse -Patient with no signs of significant alcohol withdrawal. -Patient maintained on Ativan withdrawal protocol, thiamine, folic acid, multivitamin during the hospitalization.    -Outpatient follow-up.    9.  Transaminitis -Likely secondary to problem #1. -Acute hepatitis panel negative. -Right upper quadrant abdominal ultrasound unremarkable. -LFTs trended down and transaminitis resolved.   -Outpatient follow-up.    10.  Tobacco abuse -Tobacco cessation stressed to patient.  11.  ?Transient A. fib -Per cardiology it was noted that patient had a transient A. fib when EMS got there and subsequently now in normal sinus rhythm. -Patient maintained on labetalol.  -Patient seen in consultation by cardiology and will follow up in the outpatient setting.  12.  Rash likely contact dermatitis -Patient with rash on upper back, likely a contact dermatitis from patient's hair versus tinea. -Patient underwent trial of Lotrimin during the hospitalization.  -Outpatient follow-up.      Procedures: CT head 12/03/2020 CT angiogram chest 12/03/2020 CT abdomen and pelvis 12/03/2020 Chest x-ray 12/03/2020 Abdominal ultrasound 12/03/2020 2D echo 12/03/2020  Consultations: Cardiology: Dr. Caryl Comes 12/04/2020  Discharge Exam: Vitals:   12/07/20 0646 12/07/20 1325  BP: (!) 138/92 106/74  Pulse: 98 95  Resp: 16 18  Temp: 98.9 F (37.2 C) 98 F (36.7 C)  SpO2: 99% 100%    General: NAD Cardiovascular: RRR no murmurs rubs or gallops.  No JVD.  No lower extremity edema. Respiratory: Clear to auscultation bilaterally.  No wheezes, no crackles, no rhonchi.  Normal respiratory effort.  Speaking in full sentences.  Discharge Instructions   Discharge Instructions  Diet - low sodium heart healthy   Complete by: As directed    Discharge instructions   Complete by: As directed    Please quarantine through 12/13/2020.  May come off quarantine on 12/14/2020.  ?   Person Under Monitoring Name: Micajah Rosser  Location: [place] [address]   Infection Prevention Recommendations for Individuals Confirmed to have, or Being Evaluated for, 2019 Novel  Coronavirus (COVID-19) Infection Who Receive Care at Home  Individuals who are confirmed to have, or are being evaluated for, COVID-19 should follow the prevention steps below until a healthcare provider or local or state health department says they can return to normal activities.  Stay home except to get medical care You should restrict activities outside your home, except for getting medical care. Do not go to work, school, or public areas, and do not use public transportation or taxis.  Call ahead before visiting your doctor Before your medical appointment, call the healthcare provider and tell them that you have, or are being evaluated for, COVID-19 infection. This will help the healthcare provider's office take steps to keep other people from getting infected. Ask your healthcare provider to call the local or state health department.  Monitor your symptoms Seek prompt medical attention if your illness is worsening (e.g., difficulty breathing). Before going to your medical appointment, call the healthcare provider and tell them that you have, or are being evaluated for, COVID-19 infection. Ask your healthcare provider to call the local or state health department.  Wear a facemask You should wear a facemask that covers your nose and mouth when you are in the same room with other people and when you visit a healthcare provider. People who live with or visit you should also wear a facemask while they are in the same room with you.  Separate yourself from other people in your home As much as possible, you should stay in a different room from other people in your home. Also, you should use a separate bathroom, if available.  Avoid sharing household items You should not share dishes, drinking glasses, cups, eating utensils, towels, bedding, or other items with other people in your home. After using these items, you should wash them thoroughly with soap and water.  Cover your coughs  and sneezes Cover your mouth and nose with a tissue when you cough or sneeze, or you can cough or sneeze into your sleeve. Throw used tissues in a lined trash can, and immediately wash your hands with soap and water for at least 20 seconds or use an alcohol-based hand rub.  Wash your [facility] your hands often and thoroughly with soap and water for at least 20 seconds. You can use an alcohol-based hand sanitizer if soap and water are not available and if your hands are not visibly dirty. Avoid touching your eyes, nose, and mouth with unwashed hands.   Prevention Steps for Caregivers and Household Members of Individuals Confirmed to have, or Being Evaluated for, COVID-19 Infection Being Cared for in the Home  If you live with, or provide care at home for, a person confirmed to have, or being evaluated for, COVID-19 infection please follow these guidelines to prevent infection:  Follow healthcare provider's instructions Make sure that you understand and can help the patient follow any healthcare provider instructions for all care.  Provide for the patient's basic needs You should help the patient with basic needs in the home and provide support for getting groceries, prescriptions, and other  personal needs.  Monitor the patient's symptoms If they are getting sicker, call his or her medical provider and tell them that the patient has, or is being evaluated for, COVID-19 infection. This will help the healthcare provider's office take steps to keep other people from getting infected. Ask the healthcare provider to call the local or state health department.  Limit the number of people who have contact with the patient If possible, have only one caregiver for the patient. Other household members should stay in another home or place of residence. If this is not possible, they should stay in another room, or be separated from the patient as much as possible. Use a separate bathroom, if  available. Restrict visitors who do not have an essential need to be in the home.  Keep older adults, very young children, and other sick people away from the patient Keep older adults, very young children, and those who have compromised immune systems or chronic health conditions away from the patient. This includes people with chronic heart, lung, or kidney conditions, diabetes, and cancer.  Ensure good ventilation Make sure that shared spaces in the home have good air flow, such as from an air conditioner or an opened window, weather permitting.  Wash your hands often Wash your hands often and thoroughly with soap and water for at least 20 seconds. You can use an alcohol based hand sanitizer if soap and water are not available and if your hands are not visibly dirty. Avoid touching your eyes, nose, and mouth with unwashed hands. Use disposable paper towels to dry your hands. If not available, use dedicated cloth towels and replace them when they become wet.  Wear a facemask and gloves Wear a disposable facemask at all times in the room and gloves when you touch or have contact with the patient's blood, body fluids, and/or secretions or excretions, such as sweat, saliva, sputum, nasal mucus, vomit, urine, or feces.  Ensure the mask fits over your nose and mouth tightly, and do not touch it during use. Throw out disposable facemasks and gloves after using them. Do not reuse. Wash your hands immediately after removing your facemask and gloves. If your personal clothing becomes contaminated, carefully remove clothing and launder. Wash your hands after handling contaminated clothing. Place all used disposable facemasks, gloves, and other waste in a lined container before disposing them with other household waste. Remove gloves and wash your hands immediately after handling these items.  Do not share dishes, glasses, or other household items with the patient Avoid sharing household items. You  should not share dishes, drinking glasses, cups, eating utensils, towels, bedding, or other items with a patient who is confirmed to have, or being evaluated for, COVID-19 infection. After the person uses these items, you should wash them thoroughly with soap and water.  Wash laundry thoroughly Immediately remove and wash clothes or bedding that have blood, body fluids, and/or secretions or excretions, such as sweat, saliva, sputum, nasal mucus, vomit, urine, or feces, on them. Wear gloves when handling laundry from the patient. Read and follow directions on labels of laundry or clothing items and detergent. In general, wash and dry with the warmest temperatures recommended on the label.  Clean all areas the individual has used often Clean all touchable surfaces, such as counters, tabletops, doorknobs, bathroom fixtures, toilets, phones, keyboards, tablets, and bedside tables, every day. Also, clean any surfaces that may have blood, body fluids, and/or secretions or excretions on them. Wear gloves when cleaning surfaces  the patient has come in contact with. Use a diluted bleach solution (e.g., dilute bleach with 1 part bleach and 10 parts water) or a household disinfectant with a label that says EPA-registered for coronaviruses. To make a bleach solution at home, add 1 tablespoon of bleach to 1 quart (4 cups) of water. For a larger supply, add  cup of bleach to 1 gallon (16 cups) of water. Read labels of cleaning products and follow recommendations provided on product labels. Labels contain instructions for safe and effective use of the cleaning product including precautions you should take when applying the product, such as wearing gloves or eye protection and making sure you have good ventilation during use of the product. Remove gloves and wash hands immediately after cleaning.  Monitor yourself for signs and symptoms of illness Caregivers and household members are considered close contacts,  should monitor their health, and will be asked to limit movement outside of the home to the extent possible. Follow the monitoring steps for close contacts listed on the symptom monitoring form.   ? If you have additional questions, contact your local health department or call the epidemiologist on call at [phone] (available 24/7). ? This guidance is subject to change. For the most up-to-date guidance from [facility], please refer to their website: [email or web address]   Increase activity slowly   Complete by: As directed       Allergies as of 12/07/2020       Reactions   Shellfish Allergy Anaphylaxis        Medication List     STOP taking these medications    escitalopram 20 MG tablet Commonly known as: LEXAPRO   lisinopril 5 MG tablet Commonly known as: ZESTRIL   pravastatin 20 MG tablet Commonly known as: PRAVACHOL       TAKE these medications    amoxicillin-clavulanate 875-125 MG tablet Commonly known as: AUGMENTIN Take 1 tablet by mouth every 12 (twelve) hours for 2 days.   ascorbic acid 500 MG tablet Commonly known as: VITAMIN C Take 1 tablet (500 mg total) by mouth daily. Start taking on: December 08, 2020   aspirin 81 MG EC tablet Take 1 tablet (81 mg total) by mouth daily. Swallow whole. Start taking on: December 08, 2020   fluticasone 50 MCG/ACT nasal spray Commonly known as: FLONASE Place 2 sprays into both nostrils daily. Start taking on: December 08, 7900   folic acid 1 MG tablet Commonly known as: FOLVITE Take 1 tablet (1 mg total) by mouth daily. Start taking on: December 08, 2020   guaiFENesin 600 MG 12 hr tablet Commonly known as: MUCINEX Take 2 tablets (1,200 mg total) by mouth 2 (two) times daily for 3 days.   insulin aspart 100 UNIT/ML injection Commonly known as: novoLOG Inject 5 Units into the skin with breakfast, with lunch, and with evening meal. What changed:  how much to  take when to take this additional instructions Another medication with the same name was removed. Continue taking this medication, and follow the directions you see here.   Ipratropium-Albuterol 20-100 MCG/ACT Aers respimat Commonly known as: COMBIVENT Inhale 1 puff into the lungs every 6 (six) hours as needed for wheezing.   labetalol 200 MG tablet Commonly known as: NORMODYNE Take 1 tablet (200 mg total) by mouth 2 (two) times daily.   Lantus 100 UNIT/ML injection Generic drug: insulin glargine Inject 0.15 mLs (15 Units total) into the skin at bedtime. What changed: how much to take   loratadine 10  MG tablet Commonly known as: CLARITIN Take 1 tablet (10 mg total) by mouth daily. Start taking on: December 08, 2020   losartan 25 MG tablet Commonly known as: COZAAR Take 1 tablet (25 mg total) by mouth daily. Start taking on: December 08, 2020   multivitamin with minerals Tabs tablet Take 1 tablet by mouth daily. Start taking on: December 08, 2020   pantoprazole 40 MG tablet Commonly known as: PROTONIX Take 1 tablet (40 mg total) by mouth daily. Start taking on: December 08, 2020   Pen Needles 3/16" 31G X 5 MM Misc use one pen needle daily.   thiamine 100 MG tablet Take 1 tablet (100 mg total) by mouth daily. Start taking on: December 08, 2020   True Metrix Blood Glucose Test test strip Generic drug: glucose blood Use as instructed   True Metrix Meter w/Device Kit Use kit to check blood glucose up to four times daily as directed   TRUEplus Insulin Syringe 31G X 5/16" 0.3 ML Misc Generic drug: Insulin Syringe-Needle U-100 [place] as directed as needed. What changed: Another medication with the same name was removed. Continue taking this medication, and follow the directions you see here.   TRUEplus Lancets 28G Misc Use as directed   zinc sulfate 220 (50 Zn) MG capsule Take 1 capsule (220 mg total) by mouth daily. Start taking on: December 08, 2020       Allergies   Allergen Reactions   Shellfish Allergy Anaphylaxis    Follow-up Information     PCP. Schedule an appointment as soon as possible for a visit in 2 week(s).          Deboraha Sprang, MD. Schedule an appointment as soon as possible for a visit in 2 week(s).   Specialty: Cardiology Contact information: [address] [place] [address] [phone]                  The results of significant diagnostics from this hospitalization (including imaging, microbiology, ancillary and laboratory) are listed below for reference.    Significant Diagnostic Studies: CT HEAD WO CONTRAST (5MM)  Result Date: 12/03/2020 CLINICAL DATA:  Syncope, recurrent LOC, unresponsive, s/p CPR, AMS EXAM: CT HEAD WITHOUT CONTRAST TECHNIQUE: Contiguous axial images were obtained from the base of the skull through the vertex without intravenous contrast. COMPARISON:  None. FINDINGS: Brain: No evidence of acute infarction, hemorrhage, hydrocephalus, extra-axial collection or mass lesion/mass effect. Vascular: No hyperdense vessel or unexpected calcification. Skull: Normal. Negative for fracture or focal lesion. Sinuses/Orbits: Complete opacification of the left maxillary sinus. Mild right maxillary sinus mucosal thickening. Other: None. IMPRESSION: 1. No acute intracranial findings. 2. Complete opacification of the left maxillary sinus. Mild right maxillary sinus mucosal thickening. Electronically Signed   By: Davina Poke D.O.   On: 12/03/2020 10:13   CT Angio Chest PE W and/or Wo Contrast  Result Date: 12/03/2020 CLINICAL DATA:  Syncope, unresponsive, CPR, COVID positive 12/03/2020 EXAM: CT ANGIOGRAPHY CHEST WITH CONTRAST TECHNIQUE: Multidetector CT imaging of the chest was performed using the standard protocol during bolus administration of intravenous contrast. Multiplanar CT image reconstructions and MIPs were obtained to evaluate the vascular anatomy. CONTRAST:  141m OMNIPAQUE IOHEXOL  350 MG/ML SOLN COMPARISON:  11/22/2007 FINDINGS: Cardiovascular: Minor aortic atherosclerosis. Pulsation artifact across the ascending aorta. Negative for aneurysm or dissection. No mediastinal hemorrhage or hematoma. Central pulmonary arteries appear patent. No significant filling defect or pulmonary embolus by CTA. Normal heart size. No pericardial effusion. Central  venous structures are patent. No veno-occlusive process. Mediastinum/Nodes: No enlarged mediastinal, hilar, or axillary lymph nodes. Thyroid gland, trachea, and esophagus demonstrate no significant findings. Lungs/Pleura: Bilateral lower lobe patchy and nodular peribronchovascular airspace opacities compatible with pneumonia and or aspiration. Scattered areas of ground-glass attenuation related to hypoventilatory changes. Trachea central airways are patent. No pleural abnormality, effusion or pneumothorax. Upper Abdomen: No acute abnormality. Musculoskeletal: No chest wall abnormality. No acute or significant osseous findings. Review of the MIP images confirms the above findings. IMPRESSION: Negative for significant acute pulmonary embolus by CTA. No other acute intrathoracic vascular finding. Patchy and nodular peribronchovascular airspace opacities compatible with pneumonia or aspiration. Additional bilateral scattered patchy ground-glass attenuation favored to be hypoventilatory changes. Aortic Atherosclerosis (ICD10-I70.0). Electronically Signed   By: Jerilynn Mages.  Shick M.D.   On: 12/03/2020 09:59   CT ABDOMEN PELVIS W CONTRAST  Result Date: 12/03/2020 CLINICAL DATA:  Abdominal pain.  Unresponsive. EXAM: CT ABDOMEN AND PELVIS WITH CONTRAST TECHNIQUE: Multidetector CT imaging of the abdomen and pelvis was performed using the standard protocol following bolus administration of intravenous contrast. CONTRAST:  132m OMNIPAQUE IOHEXOL 350 MG/ML SOLN COMPARISON:  None. FINDINGS: Lower chest: Bibasilar airspace disease which may reflect bibasilar pneumonia  versus aspiration pneumonia. Hepatobiliary: No focal liver abnormality is seen. No gallstones, gallbladder wall thickening, or biliary dilatation. Pancreas: Unremarkable. No pancreatic ductal dilatation or surrounding inflammatory changes. Spleen: Normal in size without focal abnormality. Adrenals/Urinary Tract: Adrenal glands are unremarkable. Kidneys are normal, without renal calculi, focal lesion, or hydronephrosis. Bladder is unremarkable. Stomach/Bowel: Severe gastric distension with a large air-fluid level. Small mild distal to the stomach is decompressed. Appendix appears normal. No evidence of bowel wall thickening, distention, or inflammatory changes. Large amount of stool throughout the colon. Vascular/Lymphatic: No significant vascular findings are present. No enlarged abdominal or pelvic lymph nodes. Reproductive: Prostate is unremarkable. Other: No abdominal wall hernia or abnormality. No abdominopelvic ascites. Musculoskeletal: No acute or significant osseous findings. IMPRESSION: 1. Bibasilar airspace disease as can be seen with bibasilar pneumonia versus aspiration pneumonia. 2. Severe gastric distension with a large air-fluid level. Small mild distal to the stomach is decompressed. This appearance can be seen with gastroparesis versus gastric outlet obstruction or gastroenteritis. Electronically Signed   By: HKathreen DevoidM.D.   On: 12/03/2020 10:16   DG Chest Port 1 View  Result Date: 12/03/2020 CLINICAL DATA:  Shortness of breath EXAM: PORTABLE CHEST 1 VIEW COMPARISON:  03/13/2017 FINDINGS: Normal heart size and mediastinal contours. There is no edema, consolidation, effusion, or pneumothorax. No acute osseous finding. IMPRESSION: Negative portable chest. Electronically Signed   By: JMonte FantasiaM.D.   On: 12/03/2020 05:54   ECHOCARDIOGRAM COMPLETE  Result Date: 12/03/2020    ECHOCARDIOGRAM REPORT   Patient Name:   RBLAKELEY MARGRAFDate of Exam: 12/03/2020 Medical Rec #:  [id number]       Height:       68.0 in Accession #:    [id number]     Weight:       175.0 lb Date of Birth:  [birth year]      BSA:          1.931 m Patient Age:    89years        BP:           147/93 mmHg Patient Gender: M               HR:  110 bpm. Exam Location:  Inpatient Procedure: 2D Echo, Cardiac Doppler, Color Doppler and Intracardiac            Opacification Agent Indications:    Covid                 Elevated Troponins  History:        Patient has no prior history of Echocardiogram examinations.                 Risk Factors:Diabetes.  Sonographer:    Luisa Hart RDCS Referring Phys: [id number] Jonnie Finner  Sonographer Comments: Suboptimal parasternal window and suboptimal apical window. IMPRESSIONS  1. Left ventricular ejection fraction, by estimation, is 40 to 45%. The left ventricle has mildly decreased function. The left ventricle demonstrates regional wall motion abnormalities with septal hypokinesis. The apex is not well-visualized, but I am concerned that an apical aneurysm may be present. Would consider cardiac MRI for full evaluation of LV. Left ventricular diastolic parameters are consistent with Grade II diastolic dysfunction (pseudonormalization).  2. Right ventricular systolic function is normal. The right ventricular size is normal. There is normal pulmonary artery systolic pressure. The estimated right ventricular systolic pressure is 23.3 mmHg.  3. The mitral valve is normal in structure. Trivial mitral valve regurgitation. No evidence of mitral stenosis.  4. The aortic valve is tricuspid. Aortic valve regurgitation is not visualized. No aortic stenosis is present.  5. The inferior vena cava is normal in size with greater than 50% respiratory variability, suggesting right atrial pressure of 3 mmHg. FINDINGS  Left Ventricle: Left ventricular ejection fraction, by estimation, is 40 to 45%. The left ventricle has mildly decreased function. The left ventricle demonstrates regional wall motion  abnormalities. Definity contrast agent was given IV to delineate the left ventricular endocardial borders. The left ventricular internal cavity size was normal in size. There is no left ventricular hypertrophy. Left ventricular diastolic parameters are consistent with Grade II diastolic dysfunction (pseudonormalization). Right Ventricle: The right ventricular size is normal. No increase in right ventricular wall thickness. Right ventricular systolic function is normal. There is normal pulmonary artery systolic pressure. The tricuspid regurgitant velocity is 2.69 m/s, and  with an assumed right atrial pressure of 3 mmHg, the estimated right ventricular systolic pressure is 00.7 mmHg. Left Atrium: Left atrial size was normal in size. Right Atrium: Right atrial size was normal in size. Pericardium: There is no evidence of pericardial effusion. Mitral Valve: The mitral valve is normal in structure. Trivial mitral valve regurgitation. No evidence of mitral valve stenosis. Tricuspid Valve: The tricuspid valve is normal in structure. Tricuspid valve regurgitation is trivial. Aortic Valve: The aortic valve is tricuspid. Aortic valve regurgitation is not visualized. No aortic stenosis is present. Aortic valve mean gradient measures 4.0 mmHg. Aortic valve peak gradient measures 6.7 mmHg. Aortic valve area, by VTI measures 2.24 cm. Pulmonic Valve: The pulmonic valve was normal in structure. Pulmonic valve regurgitation is not visualized. Aorta: The aortic root is normal in size and structure. Venous: The inferior vena cava is normal in size with greater than 50% respiratory variability, suggesting right atrial pressure of 3 mmHg. IAS/Shunts: No atrial level shunt detected by color flow Doppler.  LEFT VENTRICLE PLAX 2D LVIDd:         5.10 cm  Diastology LVIDs:         4.10 cm  LV e' medial:    6.31 cm/s LV PW:         1.00 cm  LV E/e' medial:  10.6 LV IVS:        0.90 cm  LV e' lateral:   7.18 cm/s LVOT diam:     1.90 cm  LV  E/e' lateral: 9.3 LV SV:         43 LV SV Index:   22 LVOT Area:     2.84 cm  RIGHT VENTRICLE RV Basal diam:  3.30 cm RV Mid diam:    2.00 cm RV S prime:     17.10 cm/s TAPSE (M-mode): 1.9 cm LEFT ATRIUM           Index       RIGHT ATRIUM           Index LA diam:      3.20 cm 1.66 cm/m  RA Area:     12.40 cm LA Vol (A2C): 43.9 ml 22.73 ml/m RA Volume:   26.70 ml  13.83 ml/m LA Vol (A4C): 24.4 ml 12.64 ml/m  AORTIC VALVE                   PULMONIC VALVE AV Area (Vmax):    2.18 cm    PV Vmax:       1.36 m/s AV Area (Vmean):   2.08 cm    PV Vmean:      96.350 cm/s AV Area (VTI):     2.24 cm    PV VTI:        0.233 m AV Vmax:           129.00 cm/s PV Peak grad:  7.3 mmHg AV Vmean:          87.300 cm/s PV Mean grad:  4.0 mmHg AV VTI:            0.190 m AV Peak Grad:      6.7 mmHg AV Mean Grad:      4.0 mmHg LVOT Vmax:         99.00 cm/s LVOT Vmean:        64.000 cm/s LVOT VTI:          0.150 m LVOT/AV VTI ratio: 0.79  AORTA Ao Root diam: 2.50 cm Ao Asc diam:  2.30 cm MITRAL VALVE               TRICUSPID VALVE MV Area (PHT): 6.27 cm    TR Peak grad:   28.9 mmHg MV Decel Time: 121 msec    TR Vmax:        269.00 cm/s MV E velocity: 66.70 cm/s MV A velocity: 60.70 cm/s  SHUNTS MV E/A ratio:  1.10        Systemic VTI:  0.15 m                            Systemic Diam: 1.90 cm Dalton McleanMD Electronically signed by Franki Monte Signature Date/Time: 12/03/2020/2:59:02 PM    Final    US Abdomen Limited RUQ (LIVER/GB)  Result Date: 12/03/2020 CLINICAL DATA:  Abnormal LFTs EXAM: ULTRASOUND ABDOMEN LIMITED RIGHT UPPER QUADRANT COMPARISON:  Same day CT abdomen/pelvis FINDINGS: Gallbladder: No gallstones or wall thickening visualized. No sonographic Murphy sign noted by sonographer. Common bile duct: Diameter: 5 mm Liver: No focal lesion identified. Within normal limits in parenchymal echogenicity. Portal vein is patent on color Doppler imaging with normal direction of blood flow towards the liver. Other: None.  IMPRESSION: Unremarkable right upper quadrant ultrasound. Electronically Signed  By: Valetta Mole M.D.   On: 12/03/2020 13:34    Microbiology: Recent Results (from the past 240 hour(s))  Blood culture (routine single)     Status: None (Preliminary result)   Collection Time: 12/03/20  5:59 AM   Specimen: BLOOD  Result Value Ref Range Status   Specimen Description   Final    BLOOD RIGHT ANTECUBITAL Performed at [facility] [address] [place], [place] [address]    Special Requests   Final    BOTTLES DRAWN AEROBIC AND ANAEROBIC Blood Culture adequate volume Performed at [facility] [address], [place], [place] [address]    Culture   Final    NO GROWTH 4 DAYS Performed at [facility], [place] [address], [place], [place] [address]    Report Status PENDING  Incomplete  Resp Panel by RT-PCR (Flu A&B, Covid) Nasopharyngeal Swab     Status: Abnormal   Collection Time: 12/03/20  6:30 AM   Specimen: Nasopharyngeal Swab; Nasopharyngeal(NP) swabs in vial transport medium  Result Value Ref Range Status   SARS Coronavirus 2 by RT PCR POSITIVE (A) NEGATIVE Final    Comment: RESULT CALLED TO, READ BACK BY AND VERIFIED WITH: GARRISON,G. RN @0817  ON 8.19.2022 BY NMCCOY (NOTE) SARS-CoV-2 target nucleic acids are DETECTED.  The SARS-CoV-2 RNA is generally detectable in upper respiratory specimens during the acute phase of infection. Positive results are indicative of the presence of the identified virus, but do not rule out bacterial infection or co-infection with other pathogens not detected by the test. Clinical correlation with patient history and other diagnostic information is necessary to determine patient infection status. The expected result is Negative.  Fact Sheet for Patients: EntrepreneurPulse.com.au  Fact Sheet for Healthcare Providers: IncredibleEmployment.be  This test is not yet approved  or cleared by the [place] FDA and  has been authorized for detection and/or diagnosis of SARS-CoV-2 by FDA under an Emergency Use Authorization (EUA).  This EUA will remain in effect (meaning this t est can be used) for the duration of  the COVID-19 declaration under Section 564(b)(1) of the Act, 21 U.S.C. section 360bbb-3(b)(1), unless the authorization is terminated or revoked sooner.     Influenza A by PCR NEGATIVE NEGATIVE Final   Influenza B by PCR NEGATIVE NEGATIVE Final    Comment: (NOTE) The Xpert Xpress SARS-CoV-2/FLU/RSV plus assay is intended as an aid in the diagnosis of influenza from Nasopharyngeal swab specimens and should not be used as a sole basis for treatment. Nasal washings and aspirates are unacceptable for Xpert Xpress SARS-CoV-2/FLU/RSV testing.  Fact Sheet for Patients: EntrepreneurPulse.com.au  Fact Sheet for Healthcare Providers: IncredibleEmployment.be  This test is not yet approved or cleared by the [place] FDA and has been authorized for detection and/or diagnosis of SARS-CoV-2 by FDA under an Emergency Use Authorization (EUA). This EUA will remain in effect (meaning this test can be used) for the duration of the COVID-19 declaration under Section 564(b)(1) of the Act, 21 U.S.C. section 360bbb-3(b)(1), unless the authorization is terminated or revoked.  Performed at [facility], [place] [address], [place], [place] [address]      Labs: Basic Metabolic Panel: Recent Labs  Lab 12/03/20 0534 12/03/20 1434 12/04/20 0540 12/05/20 0337 12/06/20 0330 12/07/20 0340  NA 137  --  140 139 133* 133*  K 5.0  --  4.0 3.5 3.9 4.9  CL 101  --  106 105 99 97*  CO2 22  --  27 25 26 23   GLUCOSE 261*  --  75 92 335* 468*  BUN 20  --  18 12 11 17   CREATININE 1.23 1.13 0.95 0.77 0.86 1.09  CALCIUM 8.9  --  8.4* 8.3* 8.0* 8.6*  MG  --   --   --  1.9 1.7 2.1  PHOS  --   --   --  2.7 2.5 2.8    Liver Function Tests: Recent Labs  Lab 12/03/20 0534 12/04/20 0540 12/05/20 0337 12/06/20 0330 12/07/20 0340  AST 88* 39 49* 30 26  ALT 48* 33 31 24 25   ALKPHOS 117 94 89 82 103  BILITOT 0.4 0.4 0.4 0.7 0.9  PROT 7.9 7.1 6.3* 5.6* 6.5  ALBUMIN 4.0 3.3* 3.2* 2.7* 3.3*   No results for input(s): LIPASE, AMYLASE in the last 168 hours. No results for input(s): AMMONIA in the last 168 hours. CBC: Recent Labs  Lab 12/03/20 0534 12/03/20 1434 12/04/20 0540 12/05/20 0337 12/06/20 0330 12/07/20 0340  WBC 17.7* 14.2* 12.3* 8.3 9.1 9.4  NEUTROABS 13.7*  --  10.4* 6.1 6.0 7.0  HGB 12.8* 12.0* 11.8* 11.3* 10.9* 11.7*  HCT 41.6 37.8* 36.9* 35.4* 32.7* 35.2*  MCV 101.2* 98.7 97.1 96.5 93.2 92.4  PLT 385 320 292 276 291 310   Cardiac Enzymes: No results for input(s): CKTOTAL, CKMB, CKMBINDEX, TROPONINI in the last 168 hours. BNP: BNP (last 3 results) No results for input(s): BNP in the last 8760 hours.  ProBNP (last 3 results) No results for input(s): PROBNP in the last 8760 hours.  CBG: Recent Labs  Lab 12/06/20 1638 12/06/20 2135 12/07/20 0814 12/07/20 1120 12/07/20 1539  GLUCAP 132* 425* 432* 441* 98       Signed:  Irine Seal MD.  [facility] 12/07/2020, 4:49 PM

## 2020-12-08 ENCOUNTER — Other Ambulatory Visit: Payer: Self-pay

## 2020-12-08 LAB — GLUCOSE, CAPILLARY: Glucose-Capillary: 106 mg/dL — ABNORMAL HIGH (ref 70–99)

## 2020-12-08 LAB — CULTURE, BLOOD (SINGLE)
Culture: NO GROWTH
Special Requests: ADEQUATE

## 2020-12-08 MED ORDER — AMOXICILLIN-POT CLAVULANATE 875-125 MG PO TABS
ORAL_TABLET | ORAL | 0 refills | Status: DC
  Filled 2020-12-08: qty 4, 2d supply, fill #0

## 2020-12-08 MED ORDER — IPRATROPIUM-ALBUTEROL 20-100 MCG/ACT IN AERS
INHALATION_SPRAY | RESPIRATORY_TRACT | 0 refills | Status: AC
  Filled 2020-12-08: qty 4, 30d supply, fill #0

## 2020-12-08 MED ORDER — LABETALOL HCL 200 MG PO TABS
ORAL_TABLET | ORAL | 1 refills | Status: DC
  Filled 2020-12-08: qty 60, 30d supply, fill #0
  Filled 2021-01-04: qty 60, 30d supply, fill #1

## 2020-12-08 MED ORDER — LOSARTAN POTASSIUM 25 MG PO TABS
ORAL_TABLET | ORAL | 1 refills | Status: DC
  Filled 2020-12-08: qty 30, 30d supply, fill #0
  Filled 2021-01-04 – 2021-01-06 (×2): qty 30, 30d supply, fill #1

## 2020-12-08 MED ORDER — FLUTICASONE PROPIONATE 50 MCG/ACT NA SUSP
NASAL | 0 refills | Status: DC
  Filled 2020-12-08: qty 16, 30d supply, fill #0

## 2020-12-08 MED ORDER — PANTOPRAZOLE SODIUM 40 MG PO TBEC
DELAYED_RELEASE_TABLET | ORAL | 1 refills | Status: DC
  Filled 2020-12-08: qty 30, 30d supply, fill #0
  Filled 2021-01-04 – 2021-01-06 (×2): qty 30, 30d supply, fill #1

## 2020-12-22 ENCOUNTER — Other Ambulatory Visit: Payer: Self-pay | Admitting: Physician Assistant

## 2020-12-22 ENCOUNTER — Other Ambulatory Visit: Payer: Self-pay

## 2020-12-22 DIAGNOSIS — E1069 Type 1 diabetes mellitus with other specified complication: Secondary | ICD-10-CM

## 2020-12-24 ENCOUNTER — Other Ambulatory Visit: Payer: Self-pay

## 2020-12-24 ENCOUNTER — Other Ambulatory Visit: Payer: Self-pay | Admitting: Family Medicine

## 2020-12-24 DIAGNOSIS — E1069 Type 1 diabetes mellitus with other specified complication: Secondary | ICD-10-CM

## 2020-12-29 ENCOUNTER — Other Ambulatory Visit: Payer: Self-pay

## 2021-01-03 ENCOUNTER — Other Ambulatory Visit: Payer: Self-pay

## 2021-01-03 ENCOUNTER — Other Ambulatory Visit: Payer: Self-pay | Admitting: Family Medicine

## 2021-01-03 DIAGNOSIS — E1069 Type 1 diabetes mellitus with other specified complication: Secondary | ICD-10-CM

## 2021-01-03 NOTE — Telephone Encounter (Signed)
Patient wife Gussie Towson called in to say that patient is completely out of insulins asking if her can get refill until appointment Please advise Ph# [phone]

## 2021-01-05 ENCOUNTER — Other Ambulatory Visit: Payer: Self-pay

## 2021-01-06 ENCOUNTER — Other Ambulatory Visit: Payer: Self-pay

## 2021-01-07 ENCOUNTER — Other Ambulatory Visit: Payer: Self-pay

## 2021-01-12 ENCOUNTER — Other Ambulatory Visit: Payer: Self-pay

## 2021-01-19 ENCOUNTER — Telehealth (HOSPITAL_COMMUNITY): Payer: Self-pay | Admitting: *Deleted

## 2021-01-19 NOTE — Telephone Encounter (Signed)
Attempted to call patient regarding upcoming cardiac MRI appointment. Left message on voicemail with name and callback number  Doretha Goding RN Navigator Cardiac Imaging [facility] [phone] Office [phone] Cell  

## 2021-01-21 ENCOUNTER — Ambulatory Visit (HOSPITAL_COMMUNITY): Admission: RE | Admit: 2021-01-21 | Payer: Self-pay | Source: Ambulatory Visit

## 2021-01-24 ENCOUNTER — Other Ambulatory Visit: Payer: Self-pay

## 2021-01-24 ENCOUNTER — Ambulatory Visit (INDEPENDENT_AMBULATORY_CARE_PROVIDER_SITE_OTHER): Payer: No Typology Code available for payment source | Admitting: Primary Care

## 2021-01-24 ENCOUNTER — Encounter (INDEPENDENT_AMBULATORY_CARE_PROVIDER_SITE_OTHER): Payer: Self-pay | Admitting: Primary Care

## 2021-01-24 VITALS — BP 134/87 | HR 95 | Temp 97.3°F | Ht 68.0 in | Wt 148.2 lb

## 2021-01-24 DIAGNOSIS — I4891 Unspecified atrial fibrillation: Secondary | ICD-10-CM

## 2021-01-24 DIAGNOSIS — Z720 Tobacco use: Secondary | ICD-10-CM

## 2021-01-24 DIAGNOSIS — Z760 Encounter for issue of repeat prescription: Secondary | ICD-10-CM

## 2021-01-24 DIAGNOSIS — F4323 Adjustment disorder with mixed anxiety and depressed mood: Secondary | ICD-10-CM | POA: Diagnosis not present

## 2021-01-24 DIAGNOSIS — K029 Dental caries, unspecified: Secondary | ICD-10-CM

## 2021-01-24 DIAGNOSIS — E1069 Type 1 diabetes mellitus with other specified complication: Secondary | ICD-10-CM | POA: Diagnosis not present

## 2021-01-24 DIAGNOSIS — Z23 Encounter for immunization: Secondary | ICD-10-CM | POA: Diagnosis not present

## 2021-01-24 DIAGNOSIS — I10 Essential (primary) hypertension: Secondary | ICD-10-CM

## 2021-01-24 DIAGNOSIS — F17210 Nicotine dependence, cigarettes, uncomplicated: Secondary | ICD-10-CM

## 2021-01-24 DIAGNOSIS — Z09 Encounter for follow-up examination after completed treatment for conditions other than malignant neoplasm: Secondary | ICD-10-CM

## 2021-01-24 MED ORDER — INSULIN ASPART 100 UNIT/ML IJ SOLN
INTRAMUSCULAR | 0 refills | Status: DC
  Filled 2021-01-24 – 2021-01-26 (×2): qty 10, 28d supply, fill #0

## 2021-01-24 MED ORDER — INSULIN GLARGINE 100 UNIT/ML ~~LOC~~ SOLN
25.0000 [IU] | Freq: Every day | SUBCUTANEOUS | 3 refills | Status: DC
  Filled 2021-01-24: qty 10, 28d supply, fill #0
  Filled 2021-03-13: qty 10, 28d supply, fill #1

## 2021-01-24 MED ORDER — LABETALOL HCL 200 MG PO TABS
ORAL_TABLET | ORAL | 1 refills | Status: DC
  Filled 2021-01-24: qty 60, fill #0
  Filled 2021-02-06: qty 60, 30d supply, fill #0

## 2021-01-24 MED ORDER — LOSARTAN POTASSIUM 25 MG PO TABS
ORAL_TABLET | ORAL | 1 refills | Status: DC
Start: 2021-01-24 — End: 2021-03-03
  Filled 2021-01-24: qty 30, fill #0
  Filled 2021-02-06 – 2021-02-10 (×2): qty 30, 30d supply, fill #0

## 2021-01-24 NOTE — Progress Notes (Signed)
[facility]   Subjective:  [name] is a 45 y.o. male presents for hospital follow up. He was transported to ED by EMS. He had complaints of intermittent dizziness for a several days. Wife tried to wake him up, but he was not responsive. She called EMS and started compressions on him.  EMS arrive and administer narcan. He began to wake up. Patient admits that he was with a family and tasted some powdery stuff.  Admit date to the hospital was 12/03/20, patient was discharged from the hospital on 12/07/20, patient was admitted for: Acute metabolic encephalopathy, COVID +, Sepsis ([place]), Elevated LFTs and New onset a-fib ([place]). Today, he feels better and trying to get back on the right path. He denies shortness of breath, chest pain or lower extremity edema. He does have unbearable headache 10/10 right frontal and occipital area. He does admitted to having decay teeth.  Past Medical History:  Diagnosis Date   CKD (chronic kidney disease), stage II    Diabetes mellitus without complication (HCC)    type 1   Polysubstance abuse ([employer])      Allergies  Allergen Reactions   Shellfish Allergy Anaphylaxis      Current Outpatient Medications on File Prior to Visit  Medication Sig Dispense Refill   amoxicillin-clavulanate (AUGMENTIN) 875-125 MG tablet Take 1 tablet by mouth every 12 hours for 2 days. 4 tablet 0   ascorbic acid (VITAMIN C) 500 MG tablet Take 1 tablet (500 mg total) by mouth daily.     aspirin EC 81 MG EC tablet Take 1 tablet (81 mg total) by mouth daily. Swallow whole. 30 tablet 0   Blood Glucose Monitoring Suppl (TRUE METRIX METER) w/Device KIT Use kit to check blood glucose up to four times daily as directed 1 kit 0   fluticasone (FLONASE) 50 MCG/ACT nasal spray Place 2 sprays into both nostrils daily. 9.9 mL 0   fluticasone (FLONASE) 50 MCG/ACT nasal spray Place 2 sprays into both nostrils daily. 16 g 0   folic acid (FOLVITE) 1 MG tablet Take 1 tablet (1 mg  total) by mouth daily.     glucose blood (TRUE METRIX BLOOD GLUCOSE TEST) test strip Use as instructed 100 each 12   insulin aspart (NOVOLOG) 100 UNIT/ML injection Inject 5 Units into the skin with breakfast, with lunch, and with evening meal. (Patient taking differently: Inject 0-10 Units into the skin See admin instructions. Check blood sugar with each meal and inject insulin according to sliding scale) 10 mL 0   insulin glargine (LANTUS) 100 UNIT/ML injection Inject 0.15 mLs (15 Units total) into the skin at bedtime. (Patient taking differently: Inject 30 Units into the skin at bedtime.) 10 mL 0   Insulin Pen Needle (PEN NEEDLES 3/16") 31G X 5 MM MISC use one pen needle daily. 100 each 0   Insulin Syringe-Needle U-100 31G X 5/16" 0.3 ML MISC [place] as directed as needed. 100 each 0   Ipratropium-Albuterol (COMBIVENT) 20-100 MCG/ACT AERS respimat Inhale 1 puff into the lungs every 6 (six) hours as needed for wheezing. 4 g 0   Ipratropium-Albuterol (COMBIVENT) 20-100 MCG/ACT AERS respimat Inhale 1 puff by mouth into the lungs every 6 hours as needed for wheezing. 4 g 0   labetalol (NORMODYNE) 200 MG tablet Take 1 tablet by mouth twice daily. 60 tablet 1   loratadine (CLARITIN) 10 MG tablet Take 1 tablet (10 mg total) by mouth daily. 30 tablet 0   losartan (COZAAR) 25 MG tablet  Take 1 tablet by mouth daily. 30 tablet 1   Multiple Vitamin (MULTIVITAMIN WITH MINERALS) TABS tablet Take 1 tablet by mouth daily.     pantoprazole (PROTONIX) 40 MG tablet Take 1 tablet (40 mg total) by mouth daily. 30 tablet 1   pantoprazole (PROTONIX) 40 MG tablet Take 1 tablet by mouth daily. 30 tablet 1   thiamine 100 MG tablet Take 1 tablet (100 mg total) by mouth daily.     TRUEplus Lancets 28G MISC Use as directed 100 each 0   zinc sulfate 220 (50 Zn) MG capsule Take 1 capsule (220 mg total) by mouth daily.     [DISCONTINUED] blood glucose meter kit and supplies KIT Dispense based on patient and insurance preference. Use  up to four times daily as directed. (FOR ICD-9 250.00, 250.01). (Patient not taking: No sig reported) 1 each 0   No current facility-administered medications on file prior to visit.     Review of System: Comprehensive ROS negative except noted in HPI.  Objective:  BP (!) 156/94 (BP Location: Right Arm, Patient Position: Sitting, Cuff Size: Normal)   Pulse 93   Temp (!) 97.3 F (36.3 C) (Temporal)   Ht 5' 8"  (1.727 m)   Wt 148 lb 3.2 oz (67.2 kg)   SpO2 96%   BMI 22.53 kg/m   Filed Weights   01/24/21 1333  Weight: 148 lb 3.2 oz (67.2 kg)    Physical Exam: General Appearance: Well nourished, in no apparent distress. Eyes: PERRLA, EOMs, conjunctiva no swelling or erythema Sinuses: No Frontal/maxillary tenderness ENT/Mouth: poor dental carries, Ext aud canals clear, TMs without erythema, bulging. No erythema, swelling, or exudate on post pharynx. Hearing normal.  Neck: Supple, thyroid normal.  Respiratory: Respiratory effort normal, BS equal bilaterally without rales, rhonchi, wheezing or stridor.  Cardio: RRR with no MRGs. Brisk peripheral pulses without edema.  Abdomen: Soft, + BS.  Non tender, no guarding, rebound, hernias, masses. Lymphatics: Non tender without lymphadenopathy.  Musculoskeletal: Full ROM, 5/5 strength, normal gait.  Skin: Warm, dry without rashes, lesions, ecchymosis.  Neuro: Cranial nerves intact. Normal muscle tone, no cerebellar symptoms. Sensation intact.  Psych: Awake and oriented X 3, normal affect, Insight and Judgment appropriate.    Assessment:  [name] was seen today for hospitalization follow-up.  Diagnoses and all orders for this visit:  Hospital discharge follow-up Retrieved from hospital discharge  Schedule an appointment with PCP in 2 weeks (12/21/2020) Schedule an appointment with Deboraha Sprang, MD (Cardiology) in 2 weeks (12/21/2020)- unable to keep appt . To be seen needed $180  Type 1 diabetes mellitus with other specified  complication (HCC) Adjusted insulin medication Lantus 25 units daily and sliding scale insulin.  Discussed foods that are high in carbohydrates are the following rice, potatoes, breads, sugars, and pastas.  Reduction in the intake (eating) will assist in lowering your blood sugars.   Essential hypertension Counseled on blood pressure goal of less than 130/80, low-sodium, DASH diet, medication compliance, 150 minutes of moderate intensity exercise per week. Discussed medication compliance, adverse effects.   New onset a-fib [facility]) Diagnosed in the hospital an with cardiology but unable to keep appointment due to requiring $180 upfront.  Reached out to clinical Midwife. He is taking ASA 52m daily  Dental caries Poor dental caries chief need to be removed seen or cannot be a contributing factor to headaches.  Before hospitalization was working at [employer] had insurance, he will check and see if BP  still current.  Is still schedule a dental appointment  Adjustment reaction with anxiety and depression Admitted to the hospital, unconscious, deceived by family member, positive for COVID.  Currently not working and unable to receive a return call regarding employment Will refer to psychiatry and clinical social worker [place] Office Visit from 01/24/2021 in [facility]  PHQ-9 Total Score 24       Tobacco abuse - I have recommended complete cessation of tobacco use. I have discussed various options available for assistance with tobacco cessation including over the counter methods (Nicotine gum, patch and lozenges). We also discussed prescription options (Chantix, Nicotine Inhaler / Nasal Spray). The patient is not interested in pursuing any prescription tobacco cessation options at this time. - Patient declines at this time.    This note has been created with Surveyor, quantity. Any transcriptional errors are  unintentional.   Kerin Perna, NP 01/24/2021, 1:40 PM

## 2021-01-24 NOTE — Patient Instructions (Signed)
Influenza, Adult °Influenza is also called "the flu." It is an infection in the lungs, nose, and throat (respiratory tract). It spreads easily from person to person (is contagious). The flu causes symptoms that are like a cold, along with high fever and body aches. °What are the causes? °This condition is caused by the influenza virus. You can get the virus by: °Breathing in droplets that are in the air after a person infected with the flu coughed or sneezed. °Touching something that has the virus on it and then touching your mouth, nose, or eyes. °What increases the risk? °Certain things may make you more likely to get the flu. These include: °Not washing your hands often. °Having close contact with many people during cold and flu season. °Touching your mouth, eyes, or nose without first washing your hands. °Not getting a flu shot every year. °You may have a higher risk for the flu, and serious problems, such as a lung infection (pneumonia), if you: °Are older than 65. °Are pregnant. °Have a weakened disease-fighting system (immune system) because of a disease or because you are taking certain medicines. °Have a long-term (chronic) condition, such as: °Heart, kidney, or lung disease. °Diabetes. °Asthma. °Have a liver disorder. °Are very overweight (morbidly obese). °Have anemia. °What are the signs or symptoms? °Symptoms usually begin suddenly and last 4-14 days. They may include: °Fever and chills. °Headaches, body aches, or muscle aches. °Sore throat. °Cough. °Runny or stuffy (congested) nose. °Feeling discomfort in your chest. °Not wanting to eat as much as normal. °Feeling weak or tired. °Feeling dizzy. °Feeling sick to your stomach or throwing up. °How is this treated? °If the flu is found early, you can be treated with antiviral medicine. This can help to reduce how bad the illness is and how long it lasts. This may be given by mouth or through an IV tube. °Taking care of yourself at home can help your  symptoms get better. Your doctor may want you to: °Take over-the-counter medicines. °Drink plenty of fluids. °The flu often goes away on its own. If you have very bad symptoms or other problems, you may be treated in a hospital. °Follow these instructions at home: °  °Activity °Rest as needed. Get plenty of sleep. °Stay home from work or school as told by your doctor. °Do not leave home until you do not have a fever for 24 hours without taking medicine. °Leave home only to go to your doctor. °Eating and drinking °Take an ORS (oral rehydration solution). This is a drink that is sold at pharmacies and stores. °Drink enough fluid to keep your pee pale yellow. °Drink clear fluids in small amounts as you are able. Clear fluids include: °Water. °Ice chips. °Fruit juice mixed with water. °Low-calorie sports drinks. °Eat bland foods that are easy to digest. Eat small amounts as you are able. These foods include: °Bananas. °Applesauce. °Rice. °Lean meats. °Toast. °Crackers. °Do not eat or drink: °Fluids that have a lot of sugar or caffeine. °Alcohol. °Spicy or fatty foods. °General instructions °Take over-the-counter and prescription medicines only as told by your doctor. °Use a cool mist humidifier to add moisture to the air in your home. This can make it easier for you to breathe. °When using a cool mist humidifier, clean it daily. Empty water and replace with clean water. °Cover your mouth and nose when you cough or sneeze. °Wash your hands with soap and water often and for at least 20 seconds. This is also important after   you cough or sneeze. If you cannot use soap and water, use alcohol-based hand sanitizer. °Keep all follow-up visits. °How is this prevented? ° °Get a flu shot every year. You may get the flu shot in late summer, fall, or winter. Ask your doctor when you should get your flu shot. °Avoid contact with people who are sick during fall and winter. This is cold and flu season. °Contact a doctor if: °You get  new symptoms. °You have: °Chest pain. °Watery poop (diarrhea). °A fever. °Your cough gets worse. °You start to have more mucus. °You feel sick to your stomach. °You throw up. °Get help right away if you: °Have shortness of breath. °Have trouble breathing. °Have skin or nails that turn a bluish color. °Have very bad pain or stiffness in your neck. °Get a sudden headache. °Get sudden pain in your face or ear. °Cannot eat or drink without throwing up. °These symptoms may represent a serious problem that is an emergency. Get medical help right away. Call your local emergency services (911 in the [place] °Do not wait to see if the symptoms will go away. °Do not drive yourself to the hospital. °Summary °Influenza is also called "the flu." It is an infection in the lungs, nose, and throat. It spreads easily from person to person. °Take over-the-counter and prescription medicines only as told by your doctor. °Getting a flu shot every year is the best way to not get the flu. °This information is not intended to replace advice given to you by your health care provider. Make sure you discuss any questions you have with your health care provider. °Document Revised: 11/21/2019 Document Reviewed: 11/21/2019 °Elsevier Patient Education © 2022 Elsevier Inc. ° °

## 2021-01-25 ENCOUNTER — Other Ambulatory Visit: Payer: Self-pay

## 2021-01-25 LAB — CMP14+EGFR
ALT: 40 IU/L (ref 0–44)
AST: 34 IU/L (ref 0–40)
Albumin/Globulin Ratio: 1.8 (ref 1.2–2.2)
Albumin: 4.4 g/dL (ref 4.0–5.0)
Alkaline Phosphatase: 94 IU/L (ref 44–121)
BUN/Creatinine Ratio: 16 (ref 9–20)
BUN: 15 mg/dL (ref 6–24)
Bilirubin Total: 0.3 mg/dL (ref 0.0–1.2)
CO2: 22 mmol/L (ref 20–29)
Calcium: 9.3 mg/dL (ref 8.7–10.2)
Chloride: 100 mmol/L (ref 96–106)
Creatinine, Ser: 0.95 mg/dL (ref 0.76–1.27)
Globulin, Total: 2.5 g/dL (ref 1.5–4.5)
Glucose: 204 mg/dL — ABNORMAL HIGH (ref 70–99)
Potassium: 4.9 mmol/L (ref 3.5–5.2)
Sodium: 140 mmol/L (ref 134–144)
Total Protein: 6.9 g/dL (ref 6.0–8.5)
eGFR: 99 mL/min/{1.73_m2} (ref >59)

## 2021-01-26 ENCOUNTER — Ambulatory Visit (HOSPITAL_BASED_OUTPATIENT_CLINIC_OR_DEPARTMENT_OTHER): Payer: Self-pay | Admitting: Family

## 2021-01-26 ENCOUNTER — Other Ambulatory Visit: Payer: Self-pay

## 2021-01-27 ENCOUNTER — Other Ambulatory Visit: Payer: Self-pay

## 2021-01-27 ENCOUNTER — Telehealth (INDEPENDENT_AMBULATORY_CARE_PROVIDER_SITE_OTHER): Payer: Self-pay

## 2021-01-27 NOTE — Telephone Encounter (Signed)
Patient verified date of birth. He is aware of normal results as well as diet and exercise advise per PCP. He verbalized understanding. Maryjean Morn, CMA

## 2021-01-27 NOTE — Telephone Encounter (Signed)
-----   Message from Grayce Sessions, NP sent at 01/25/2021  4:58 PM EDT ----- Labs are normal. Make sure you are drinking at least 48 oz of water per day. Work on eating a low fat, heart healthy diet and participate in regular aerobic exercise program to control as well. Exercise at least  30 minutes per day-5 days per week. Avoid red meat. No fried foods. No junk foods, sodas, sugary foods or drinks, unhealthy snacking, alcohol or smoking.

## 2021-02-06 ENCOUNTER — Other Ambulatory Visit (INDEPENDENT_AMBULATORY_CARE_PROVIDER_SITE_OTHER): Payer: Self-pay | Admitting: Internal Medicine

## 2021-02-07 ENCOUNTER — Other Ambulatory Visit: Payer: Self-pay

## 2021-02-09 ENCOUNTER — Other Ambulatory Visit: Payer: Self-pay

## 2021-02-10 ENCOUNTER — Other Ambulatory Visit: Payer: Self-pay

## 2021-02-11 ENCOUNTER — Other Ambulatory Visit: Payer: Self-pay

## 2021-02-21 ENCOUNTER — Ambulatory Visit: Payer: No Typology Code available for payment source | Attending: Primary Care | Admitting: Pharmacist

## 2021-02-21 ENCOUNTER — Other Ambulatory Visit: Payer: Self-pay

## 2021-02-21 ENCOUNTER — Encounter: Payer: Self-pay | Admitting: Pharmacist

## 2021-02-21 DIAGNOSIS — E1069 Type 1 diabetes mellitus with other specified complication: Secondary | ICD-10-CM

## 2021-02-21 NOTE — Progress Notes (Signed)
    S:    PCP: Michelle   Patient arrives in good spirits.  Presents for diabetes evaluation, education, and management. Patient was referred and last seen by Primary Care Provider on 01/24/2021.   Patient reports Diabetes was diagnosed around age 45. Dx as type 1. Was hospitalized with DKA and started on basal/bolus regimen. Per pt, he has never used an insulin pump.   Family/Social History:  -Fhx: DM -Tobacco: current 0.5 PPD smoker  -Alcohol: denies use currently   Insurance coverage/medication affordability: Dnbi   Medication adherence reported.   Current diabetes medications include: Lantus 25 units daily (takes 20 units), Novolog per sliding scale   Patient denies hypoglycemic events. Becomes aware once CBGs at home drop to below 100. Never sees anything <70.   Patient reported dietary habits: -Snacks during the day -Will keep apple juice, candy bars with him in case his sugar "drops"   Patient-reported exercise habits:  - Walks ~30 minutes daily    Patient denies nocturia (nighttime urination).  Patient denies neuropathy (nerve pain). Patient denies visual changes. Patient reports self foot exams.     O:  Physical Exam  ROS  Lab Results  Component Value Date   HGBA1C 12.4 (H) 11/23/2020   There were no vitals filed for this visit.  Lipid Panel     Component Value Date/Time   CHOL 101 12/06/2020 0330   CHOL 191 01/28/2018 1205   TRIG 34 12/06/2020 0330   HDL 62 12/06/2020 0330   HDL 107 01/28/2018 1205   CHOLHDL 1.6 12/06/2020 0330   VLDL 7 12/06/2020 0330   LDLCALC 32 12/06/2020 0330   LDLCALC 69 01/28/2018 1205   Home fasting blood sugars: reports 90s-100s. No meter with him today.   2 hour post-meal/random blood sugars: reports 200s.   Clinical Atherosclerotic Cardiovascular Disease (ASCVD): No  The ASCVD Risk score (Arnett DK, et al., 2019) failed to calculate for the following reasons:   The valid total cholesterol range is 130 to 320 mg/dL     A/P: Diabetes longstanding currently uncontrolled. Patient is able to verbalize appropriate hypoglycemia management plan. Medication adherence appears okay. -Increased dose of Lantus to 25 units daily.  -Continue sliding scale.  -Advised patient to eat regular meals instead of snacking  -Extensively discussed pathophysiology of diabetes, recommended lifestyle interventions, dietary effects on blood sugar control -Counseled on s/sx of and management of hypoglycemia -Next A1C anticipated 02/2021.    Written patient instructions provided.  Total time in face to face counseling 15 minutes.   Follow up Pharmacist Clinic Visit in 1.5 months.   Butch Penny, PharmD, P[place]y [place], CPP Clinical Pharmacist [facility] [phone]

## 2021-02-25 ENCOUNTER — Telehealth (HOSPITAL_COMMUNITY): Payer: Self-pay | Admitting: *Deleted

## 2021-02-25 NOTE — Telephone Encounter (Signed)
Attempted to call patient regarding upcoming cardiac MRI appointment. Left message on voicemail with name and callback number  Joycelin Radloff RN Navigator Cardiac Imaging [facility] [phone] Office [phone] Cell  

## 2021-02-28 ENCOUNTER — Ambulatory Visit (HOSPITAL_COMMUNITY)
Admission: RE | Admit: 2021-02-28 | Discharge: 2021-02-28 | Disposition: A | Discharge status: Home / Self Care | Payer: No Typology Code available for payment source | Source: Ambulatory Visit | Attending: Cardiology | Admitting: Cardiology

## 2021-02-28 ENCOUNTER — Ambulatory Visit (INDEPENDENT_AMBULATORY_CARE_PROVIDER_SITE_OTHER): Payer: No Typology Code available for payment source | Admitting: Primary Care

## 2021-02-28 ENCOUNTER — Other Ambulatory Visit: Payer: Self-pay

## 2021-02-28 DIAGNOSIS — I253 Aneurysm of heart: Secondary | ICD-10-CM | POA: Diagnosis not present

## 2021-02-28 DIAGNOSIS — R931 Abnormal findings on diagnostic imaging of heart and coronary circulation: Secondary | ICD-10-CM | POA: Insufficient documentation

## 2021-02-28 MED ORDER — GADOBUTROL 1 MMOL/ML IV SOLN
7.0000 mL | Freq: Once | INTRAVENOUS | Status: AC | PRN
  Administered 2021-02-28: 7 mL via INTRAVENOUS

## 2021-03-01 ENCOUNTER — Ambulatory Visit (INDEPENDENT_AMBULATORY_CARE_PROVIDER_SITE_OTHER): Payer: No Typology Code available for payment source | Admitting: Primary Care

## 2021-03-01 ENCOUNTER — Encounter (INDEPENDENT_AMBULATORY_CARE_PROVIDER_SITE_OTHER): Payer: Self-pay | Admitting: Primary Care

## 2021-03-01 VITALS — BP 132/86 | HR 89 | Temp 97.3°F | Ht 68.0 in | Wt 144.0 lb

## 2021-03-01 DIAGNOSIS — H6123 Impacted cerumen, bilateral: Secondary | ICD-10-CM

## 2021-03-01 DIAGNOSIS — E109 Type 1 diabetes mellitus without complications: Secondary | ICD-10-CM

## 2021-03-01 DIAGNOSIS — E1069 Type 1 diabetes mellitus with other specified complication: Secondary | ICD-10-CM | POA: Diagnosis not present

## 2021-03-01 DIAGNOSIS — Z1211 Encounter for screening for malignant neoplasm of colon: Secondary | ICD-10-CM

## 2021-03-01 LAB — POCT GLYCOSYLATED HEMOGLOBIN (HGB A1C): Hemoglobin A1C: 8.4 % — AB (ref 4.0–5.6)

## 2021-03-01 LAB — GLUCOSE, POCT (MANUAL RESULT ENTRY): POC Glucose: 84 mg/dl (ref 70–99)

## 2021-03-01 NOTE — Progress Notes (Signed)
[facility]  Subjective:    [name] is a 45 y.o. male whom I am asked to see for evaluation of bilateral cerumen  in both ears for the past 8 days. There is not a prior history of cerumen impaction. The patient has not been using ear drops to loosen wax immediately prior to this visit. The patient denies ear pain. Management of diabetes type 1. Previously seen clinical pharmacist adjusted medication on February 25, 2021.  A1c today is 8.4 significant improvement from 12.  He has a follow-up with [facility] March 24, 2021.  No changes in diabetic medication.  Denies polyphagia, polyuria, polydipsia or vision changes.  He does have sharp pain that goes through feet intermittently.  The patient's history has been marked as reviewed and updated as appropriate. allergies, current medications, past family history, past medical history, past social history, past surgical history, and problem list Past Medical History:  Diagnosis Date   CKD (chronic kidney disease), stage II    Diabetes mellitus without complication ([place])    type 1   Polysubstance abuse ([place])    Patient Active Problem List   Diagnosis Date Noted   Acute respiratory failure with hypoxia ([place])    Acute metabolic encephalopathy 09/32/6712   Pneumonia 12/04/2020   Transaminitis 12/04/2020   Essential hypertension 12/04/2020   Abnormal echocardiogram 12/04/2020   Elevated troponin 12/04/2020   Sepsis ([place]) 12/04/2020   Elevated LFTs    New onset a-fib ([place])    Polysubstance abuse ([place])    COVID-19 12/03/2020   Tobacco abuse 03/13/2017   Acute renal failure superimposed on stage 2 chronic kidney disease ([place]) 03/13/2017   Hyperkalemia 03/13/2017   SIRS (systemic inflammatory response syndrome) ([place]) 03/13/2017   Type 1 diabetes mellitus with other specified complication ([place]) 45/80/9983   Neuropathy 12/28/2013   Substance abuse ([place]) 12/28/2013   Alcohol dependence ([place]) 12/28/2013   Abdominal pain,  epigastric 12/28/2013   Protein-calorie malnutrition, severe ([place]) 12/28/2013   DKA (diabetic ketoacidoses) 12/27/2013   Past Surgical History:  Procedure Laterality Date   DENTAL SURGERY     Family History  Problem Relation Age of Onset   Arthritis Mother    Diabetes Mellitus II Father    Social History   Socioeconomic History   Marital status: Married    Spouse name: Not on file   Number of children: Not on file   Years of education: Not on file   Highest education level: Not on file  Occupational History   Not on file  Tobacco Use   Smoking status: Every Day    Packs/day: 0.50    Years: 20.00    Pack years: 10.00    Types: Cigarettes   Smokeless tobacco: Never  Vaping Use   Vaping Use: Never used  Substance and Sexual Activity   Alcohol use: Yes    Alcohol/week: 40.0 standard drinks    Types: 40 Cans of beer per week   Drug use: Yes    Frequency: 5.0 times per week    Types: Marijuana, Cocaine   Sexual activity: Not on file  Other Topics Concern   Not on file  Social History Narrative   Not on file   Social Determinants of Health   Financial Resource Strain: Not on file  Food Insecurity: Not on file  Transportation Needs: Not on file  Physical Activity: Not on file  Stress: Not on file  Social Connections: Not on file   Current Outpatient Medications  Medication Sig  Dispense Refill   aspirin EC 81 MG EC tablet Take 1 tablet (81 mg total) by mouth daily. Swallow whole. 30 tablet 0   Blood Glucose Monitoring Suppl (TRUE METRIX METER) w/Device KIT Use kit to check blood glucose up to four times daily as directed 1 kit 0   fluticasone (FLONASE) 50 MCG/ACT nasal spray Place 2 sprays into both nostrils daily. 16 g 0   glucose blood (TRUE METRIX BLOOD GLUCOSE TEST) test strip Use as instructed 100 each 12   insulin aspart (NOVOLOG) 100 UNIT/ML injection For blood sugars 0-150 give 0 units of insulin, 201-250 give 4 units, 251-300 give 6 units, 301-350 give 8  units, 351-400 give 10 units,> 400 give 12 units and call M.D. 10 mL 0   insulin glargine (LANTUS) 100 UNIT/ML injection Inject 0.25 mLs (25 Units total) into the skin at bedtime. 10 mL 3   Insulin Syringe-Needle U-100 31G X 5/16" 0.3 ML MISC [place] as directed as needed. 100 each 0   Ipratropium-Albuterol (COMBIVENT) 20-100 MCG/ACT AERS respimat Inhale 1 puff by mouth into the lungs every 6 hours as needed for wheezing. 4 g 0   labetalol (NORMODYNE) 200 MG tablet Take 1 tablet by mouth twice daily. 60 tablet 1   losartan (COZAAR) 25 MG tablet Take 1 tablet by mouth daily. 30 tablet 1   TRUEplus Lancets 28G MISC Use as directed 100 each 0   pantoprazole (PROTONIX) 40 MG tablet Take 1 tablet by mouth daily. (Patient not taking: Reported on 03/01/2021) 30 tablet 1   No current facility-administered medications for this visit.   Allergies: Shellfish allergy  Review of Systems Pertinent items are noted in HPI.    Objective:  Diagnoses and all orders for this visit:  Bilateral impacted cerumen  Auditory canal(s) of both ears are completely obstructed with cerumen.  Cerumen was removed using gentle irrigation. Tympanic membranes are intact following the procedure.  Auditory canals are normal.    Cerumen Impaction without otitis externa.   [name] was seen today for diabetes and cerumen impaction  Type 1 diabetes mellitus with other specified complication ([place]) -     HgB A1c  8.4 Abnormal   12.4 High   January 21, 2021 patient was seen by clinical pharmacist medication was adjusted.  Vast Improvement.  He has a follow-up appointment with clinical pharmacist next month no changes in medication titration regiment. -     Glucose (CBG) -     Ambulatory referral to Ophthalmology  Colon cancer screening -     Ambulatory referral to Gastroenterology  Comprehensive diabetic foot examination, type 1 DM, encounter for [facility]) Completed    Isaac Mire, NP

## 2021-03-03 ENCOUNTER — Encounter (HOSPITAL_BASED_OUTPATIENT_CLINIC_OR_DEPARTMENT_OTHER): Payer: Self-pay | Admitting: Family

## 2021-03-03 ENCOUNTER — Ambulatory Visit (INDEPENDENT_AMBULATORY_CARE_PROVIDER_SITE_OTHER): Payer: Self-pay | Admitting: Family

## 2021-03-03 ENCOUNTER — Other Ambulatory Visit: Payer: Self-pay

## 2021-03-03 ENCOUNTER — Telehealth: Payer: Self-pay

## 2021-03-03 VITALS — BP 120/72 | HR 83 | Ht 68.0 in | Wt 146.8 lb

## 2021-03-03 DIAGNOSIS — I4891 Unspecified atrial fibrillation: Secondary | ICD-10-CM

## 2021-03-03 DIAGNOSIS — R931 Abnormal findings on diagnostic imaging of heart and coronary circulation: Secondary | ICD-10-CM

## 2021-03-03 DIAGNOSIS — I10 Essential (primary) hypertension: Secondary | ICD-10-CM

## 2021-03-03 DIAGNOSIS — Z760 Encounter for issue of repeat prescription: Secondary | ICD-10-CM

## 2021-03-03 DIAGNOSIS — I253 Aneurysm of heart: Secondary | ICD-10-CM

## 2021-03-03 MED ORDER — PANTOPRAZOLE SODIUM 40 MG PO TBEC
DELAYED_RELEASE_TABLET | ORAL | 3 refills | Status: DC
  Filled 2021-03-03 – 2021-04-15 (×2): qty 30, 30d supply, fill #0

## 2021-03-03 MED ORDER — LABETALOL HCL 200 MG PO TABS
ORAL_TABLET | ORAL | 3 refills | Status: DC
  Filled 2021-03-03: qty 60, 30d supply, fill #0
  Filled 2021-04-15 – 2021-04-21 (×2): qty 60, 30d supply, fill #1
  Filled 2021-04-21 – 2021-04-22 (×2): qty 60, 30d supply, fill #0

## 2021-03-03 MED ORDER — LOSARTAN POTASSIUM 25 MG PO TABS
ORAL_TABLET | ORAL | 3 refills | Status: DC
  Filled 2021-03-03 – 2021-03-13 (×2): qty 30, 30d supply, fill #0
  Filled 2021-04-15: qty 30, 30d supply, fill #1

## 2021-03-03 NOTE — Telephone Encounter (Signed)
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  The right ventricle is enlarged.  There is also an abnormality in the septal mid wall with LGE concerning for possible scar.   Please order the following: 1.  Gated coronary CTA to further evaluate unroofed coronary sinus as well as rule out obstructive coronary disease in the setting of possible apical aneurysm/pseudoaneurysm   2.  Cardiac PET scan which would need to be done at [facility] to rule out sarcoidosis

## 2021-03-03 NOTE — Patient Instructions (Signed)
Medication Instructions:  Your Physician recommend you continue on your current medication as directed.    *If you need a refill on your cardiac medications before your next appointment, please call your pharmacy*   Lab Work: None ordered today If you have labs (blood work) drawn today and your tests are completely normal, you will receive your results only by: MyChart Message (if you have MyChart) OR A paper copy in the mail If you have any lab test that is abnormal or we need to change your treatment, we will call you to review the results.   Testing/Procedures: Based on your cardiac MRI, we recommend a Gated CT and PET scan to assess for other causes of low heart pumping function. We will reach out to you to schedule these tests.    Follow-Up: At [facility], you and your health needs are our priority.  As part of our continuing mission to provide you with exceptional heart care, we have created designated Provider Care Teams.  These Care Teams include your primary Cardiologist (physician) and Advanced Practice Providers (APPs -  Physician Assistants and Nurse Practitioners) who all work together to provide you with the care you need, when you need it.  We recommend signing up for the patient portal called "MyChart".  Sign up information is provided on this After Visit Summary.  MyChart is used to connect with patients for Virtual Visits (Telemedicine).  Patients are able to view lab/test results, encounter notes, upcoming appointments, etc.  Non-urgent messages can be sent to your provider as well.   To learn more about what you can do with MyChart, go to [email or web address].    Your next appointment:   2 month(s)  The format for your next appointment:   In Person  Provider:   Sherryl Manges, MD  or APP  If MD is not listed, click here to update    :1}    Other Instructions Recommend drinking less than 2 liters of fluid per day, keep up the good work staying  active  Food Choices for Gastroesophageal Reflux Disease, Adult When you have gastroesophageal reflux disease (GERD), the foods you eat and your eating habits are very important. Choosing the right foods can help ease your discomfort. Think about working with a food expert (dietitian) to help you make good choices. What are tips for following this plan? Reading food labels Look for foods that are low in saturated fat. Foods that may help with your symptoms include: Foods that have less than 5% of daily value (DV) of fat. Foods that have 0 grams of trans fat. Cooking Do not fry your food. Cook your food by baking, steaming, grilling, or broiling. These are all methods that do not need a lot of fat for cooking. To add flavor, try to use herbs that are low in spice and acidity. Meal planning  Choose healthy foods that are low in fat, such as: Fruits and vegetables. Whole grains. Low-fat dairy products. Lean meats, fish, and poultry. Eat small meals often instead of eating 3 large meals each day. Eat your meals slowly in a place where you are relaxed. Avoid bending over or lying down until 2-3 hours after eating. Limit high-fat foods such as fatty meats or fried foods. Limit your intake of fatty foods, such as oils, butter, and shortening. Avoid the following as told by your doctor: Foods that cause symptoms. These may be different for different people. Keep a food diary to keep track of foods that cause  symptoms. Alcohol. Drinking a lot of liquid with meals. Eating meals during the 2-3 hours before bed. Lifestyle Stay at a healthy weight. Ask your doctor what weight is healthy for you. If you need to lose weight, work with your doctor to do so safely. Exercise for at least 30 minutes on 5 or more days each week, or as told by your doctor. Wear loose-fitting clothes. Do not smoke or use any products that contain nicotine or tobacco. If you need help quitting, ask your doctor. Sleep with  the head of your bed higher than your feet. Use a wedge under the mattress or blocks under the bed frame to raise the head of the bed. Chew sugar-free gum after meals. What foods should eat? Eat a healthy, well-balanced diet of fruits, vegetables, whole grains, low-fat dairy products, lean meats, fish, and poultry. Each person is different. Foods that may cause symptoms in one person may not cause any symptoms in another person. Work with your doctor to find foods that are safe for you. The items listed above may not be a complete list of what you can eat and drink. Contact a food expert for more options. What foods should I avoid? Limiting some of these foods may help in managing the symptoms of GERD. Everyone is different. Talk with a food expert or your doctor to help you find the exact foods to avoid, if any. Fruits Any fruits prepared with added fat. Any fruits that cause symptoms. For some people, this may include citrus fruits, such as oranges, grapefruit, pineapple, and lemons. Vegetables Deep-fried vegetables. [place] fries. Any vegetables prepared with added fat. Any vegetables that cause symptoms. For some people, this may include tomatoes and tomato products, chili peppers, onions and garlic, and horseradish. Grains Pastries or quick breads with added fat. Meats and other proteins High-fat meats, such as fatty beef or pork, hot dogs, ribs, ham, sausage, salami, and bacon. Fried meat or protein, including fried fish and fried chicken. Nuts and nut butters, in large amounts. Dairy Whole milk and chocolate milk. Sour cream. Cream. Ice cream. Cream cheese. Milkshakes. Fats and oils Butter. Margarine. Shortening. Ghee. Beverages Coffee and tea, with or without caffeine. Carbonated beverages. Sodas. Energy drinks. Fruit juice made with acidic fruits, such as orange or grapefruit. Tomato juice. Alcoholic drinks. Sweets and desserts Chocolate and cocoa. Donuts. Seasonings and  condiments Pepper. Peppermint and spearmint. Added salt. Any condiments, herbs, or seasonings that cause symptoms. For some people, this may include curry, hot sauce, or vinegar-based salad dressings. The items listed above may not be a complete list of what you should not eat and drink. Contact a food expert for more options. Questions to ask your doctor Diet and lifestyle changes are often the first steps that are taken to manage symptoms of GERD. If diet and lifestyle changes do not help, talk with your doctor about taking medicines. Where to find more information International Foundation for Gastrointestinal Disorders: aboutgerd.org Summary When you have GERD, food and lifestyle choices are very important in easing your symptoms. Eat small meals often instead of 3 large meals a day. Eat your meals slowly and in a place where you are relaxed. Avoid bending over or lying down until 2-3 hours after eating. Limit high-fat foods such as fatty meats or fried foods. This information is not intended to replace advice given to you by your health care provider. Make sure you discuss any questions you have with your health care provider. Document Revised: 10/13/2019 Document  Reviewed: 10/13/2019 Elsevier Patient Education  2022 [employer].

## 2021-03-03 NOTE — Telephone Encounter (Signed)
Left message for Vonna Kotyk at [facility] to call back in regards to referral for cardiac PET scan.

## 2021-03-03 NOTE — Telephone Encounter (Signed)
-----   Message from Alver Sorrow, NP sent at 03/03/2021  4:17 PM EST ----- Reviewed with patient in clinic visit 03/03/21. He is agreeable to gated CT and PET. Please assist to order and facilitate. Thank you!

## 2021-03-03 NOTE — Progress Notes (Signed)
Office Visit    Patient Name: [name] Date of Encounter: 03/03/2021  PCP:  Kerin Perna, NP   [place]  Cardiologist:  Virl Axe, MD  Advanced Practice Provider:  No care team member to display Electrophysiologist:  None      Chief Complaint    [name] is a 45 y.o. male with a hx of type 1 diabetes, HTN, depression, COVID pneumonia 11/2020, polysubstance use, tobacco use, combined systolic and diastolic heart failure presents today for follow up after cardiac MRI   Past Medical History    Past Medical History:  Diagnosis Date   CKD (chronic kidney disease), stage II    Diabetes mellitus without complication ([place])    type 1   Polysubstance abuse ([place])    Past Surgical History:  Procedure Laterality Date   DENTAL SURGERY      Allergies  Allergies  Allergen Reactions   Shellfish Allergy Anaphylaxis    History of Present Illness    [name] is a 45 y.o. male with a hx of  type 1 diabetes, HTN, depression, COVID pneumonia 11/2020, polysubstance use, tobacco use, combined systolic and diastolic heart failure last seen while hospitalized.  He was admitted 12/03/20-12/07/20. He presented via EMS after being found unresponsive by his wife at home who started chest compressions and improved with Narcan by EMS. Positive for both opiates and THC on admit. Brief episode of PAF in ED and spontaneously converted with no recurrence during admission. He was COVID positive and treated for COVID PNA and sepsis. CT angio with no PE but did show bibasilar pneumonia. Treated with IV abx and supportive therapy. He had elevated troponin felt due to demand ischemia. Echo with LVEF 40-45%, WMA, gr2DD, concern for apical aneurysm. Statin was deferred as LDL 32. He was evaluated by cardiology and recommended for outpatient MRI.   Cardiac MRI with mild LV systolic dysfunction LVEF 40% and septal hypokinesis with congenital apical diverticulum  and less likely aneurysm/pseudoaneurys. Coronary sinus also significantly dilated with unroofed coronary simus with suspicion for cardiac shunt, abnormality septal mid wall with LGE concerning for scar. He was recommended for gated coronary CTA and cardiac PET.   He presents today for follow up. Shares with me that he was working at [employer] at time of his hospitalization but has since lost his job and Copy. Concerns about how he will continue to afford medications such as insulin. His wife does work for [employer]. Tells me he has not used cocaine since discharge but has used THC. He helps take care of his grandson who is 88 years old. Notes indigestion but has run out of his Protonix. Does eat significant amount of fried foods. GERD diet education provided. Notes palpitations throughout the day. This has been ongoing for a few years and is unchanged. Not associated with pain. No excessive caffeine but endorses only drinking  couple small glasses of apple juice per day. Notes lightheadedness only with quick position changes which does not occur often. No edema, orthopnea, PND, DOE.   EKGs/Labs/Other Studies Reviewed:   The following studies were reviewed today:  Echo 12/03/20  1. Left ventricular ejection fraction, by estimation, is 40 to 45%. The  left ventricle has mildly decreased function. The left ventricle  demonstrates regional wall motion abnormalities with septal hypokinesis.  The apex is not well-visualized, but I am  concerned that an apical aneurysm may be present. Would consider cardiac  MRI for  full evaluation of LV. Left ventricular diastolic parameters are  consistent with Grade II diastolic dysfunction (pseudonormalization).   2. Right ventricular systolic function is normal. The right ventricular  size is normal. There is normal pulmonary artery systolic pressure. The  estimated right ventricular systolic pressure is 25.4 mmHg.   3. The mitral valve is  normal in structure. Trivial mitral valve  regurgitation. No evidence of mitral stenosis.   4. The aortic valve is tricuspid. Aortic valve regurgitation is not  visualized. No aortic stenosis is present.   5. The inferior vena cava is normal in size with greater than 50%  respiratory variability, suggesting right atrial pressure of 3 mmHg.   Cardiac MRI 02/28/21 IMPRESSION: 1. There is an outpouching off the LV apex measuring 71m in diameter in diastole. While it has a narrow neck that could raise concern for a pseudoaneurysm, the wall of the outpouching contracts synchronously with the left ventricle, arguing against a pseudoaneurysm or aneurysm. In addition, there is no late gadolinium enhancement in the outpouching, which would be expected in an aneurysm or pseudoaneurysm. 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 From review of prior imaging, appears present on CTA chest from 2009. Would recommend coronary CTA to exclude obstructive CAD that could lead to aneurysm/pseudoaneurysm, but suspect this a congenital apical diverticulum.   2. Marked difference in LV and RV stroke volumes in absence of significant valvular disease raises concern for cardiac shunt. Coronary sinus is significantly dilated and appears to be an unroofed coronary sinus, though not well visualized. While Qp:Qs was not done, the ratio of RV/LV stroke volumes (2:1) and moderate RV dilatation suggests significant shunt. Would recommend further evaluation with gated cardiac CT to confirm unroofed coronary sinus (and rule out obstructive CAD as above)   3. Septal midwall LGE, which is a scar pattern seen in nonischemic cardiomyopathies. Would recommend cardiac PET scan to rule out sarcoidosis.   4. Normal LV size, mild hypertrophy, and mild systolic dysfunction (EF 427%. Septal hypokinesis.   5.  Moderate RV dilatation with normal systolic function (EF 506%   EKG:  EKG is ordered today.  The  ekg ordered today demonstrates NST 83 bpm with no acute ST/T wave changes.   Recent Labs: 12/07/2020: Hemoglobin 11.7; Magnesium 2.1; Platelets 310 01/24/2021: ALT 40; BUN 15; Creatinine, Ser 0.95; Potassium 4.9; Sodium 140  Recent Lipid Panel    Component Value Date/Time   CHOL 101 12/06/2020 0330   CHOL 191 01/28/2018 1205   TRIG 34 12/06/2020 0330   HDL 62 12/06/2020 0330   HDL 107 01/28/2018 1205   CHOLHDL 1.6 12/06/2020 0330   VLDL 7 12/06/2020 0330   LDLCALC 32 12/06/2020 0330   LDLCALC 69 01/28/2018 1205     Home Medications   Current Meds  Medication Sig   aspirin EC 81 MG EC tablet Take 1 tablet (81 mg total) by mouth daily. Swallow whole.   Blood Glucose Monitoring Suppl (TRUE METRIX METER) w/Device KIT Use kit to check blood glucose up to four times daily as directed   fluticasone (FLONASE) 50 MCG/ACT nasal spray Place 2 sprays into both nostrils daily.   glucose blood (TRUE METRIX BLOOD GLUCOSE TEST) test strip Use as instructed   insulin aspart (NOVOLOG) 100 UNIT/ML injection For blood sugars 0-150 give 0 units of insulin, 201-250 give 4 units, 251-300 give 6 units, 301-350 give 8 units, 351-400 give 10 units,> 400 give 12 units and call M.D.   insulin glargine (LANTUS) 100 UNIT/ML  injection Inject 0.25 mLs (25 Units total) into the skin at bedtime.   Insulin Syringe-Needle U-100 31G X 5/16" 0.3 ML MISC [place] as directed as needed.   Ipratropium-Albuterol (COMBIVENT) 20-100 MCG/ACT AERS respimat Inhale 1 puff by mouth into the lungs every 6 hours as needed for wheezing.   TRUEplus Lancets 28G MISC Use as directed   [DISCONTINUED] labetalol (NORMODYNE) 200 MG tablet Take 1 tablet by mouth twice daily.   [DISCONTINUED] losartan (COZAAR) 25 MG tablet Take 1 tablet by mouth daily.     Review of Systems      All other systems reviewed and are otherwise negative except as noted above.  Physical Exam    VS:  BP 120/72 (BP Location: Left Arm, Patient Position: Sitting,  Cuff Size: Normal)   Pulse 83   Ht _0  (1.727 m)   Wt 146 lb 12.8 oz (66.6 kg)   BMI 22.32 kg/m  , BMI Body mass index is 22.32 kg/m.  Wt Readings from Last 3 Encounters:  03/03/21 146 lb 12.8 oz (66.6 kg)  03/01/21 144 lb (65.3 kg)  01/24/21 148 lb 3.2 oz (67.2 kg)     GEN: Well nourished, well developed, in no acute distress. HEENT: normal. Neck: Supple, no JVD, carotid bruits, or masses. Cardiac: RRR, no murmurs, rubs, or gallops. No clubbing, cyanosis, edema.  Radials/PT 2+ and equal bilaterally.  Respiratory:  Respirations regular and unlabored, clear to auscultation bilaterally. GI: Soft, nontender, nondistended. MS: No deformity or atrophy. Skin: Warm and dry, no rash. Neuro:  Strength and sensation are intact. Psych: Normal affect.  Assessment & Plan    Systolic and diastolic heart failure / Question apical aneurysm - Echo during admit 11/2020 with recommendation for cardiac MRI. Cardiac MRI performed 02/28/21 detailed above. Concern for apical aneurysm vs congenital abnormality as well as CAD. Plan for coronary CTA and PET per Dr. Landis Gandy recommendations. Euvolemic and well compensated on exam. NYHA I with no dyspnea. Present GDMT includes Labetolol and Losartan. No indication for loop diuretic at this time. Future considerations include SGLT2i or MRA or Entresto. MRA likely to be most cost effective as he is without insurance at this time. Discuss at follow up. Heart healthy diet, <2L fluid restriction encouraged.   Transient atrial fib - Isolated episode in ED during admit 11/2020 in setting of COVID 19 pneumonia, sepsis, opioid overdose. No indication for anticoagulation.   Polysubstance abuse / Tobacco use - Smoking cessation encouraged. Recommend utilization of 1800QUITNOW. Still using THC post discharge. Reports no cocaine use. Recommend complete avoidance of alcohol.   HTN - BP well controlled. Continue current antihypertensive regimen.  Losartan 21m QD and Labetolol  2055mBID refills provided.   GERD - Refill Protonix 4040mD provided. GERD dietary education provided.   Financial constraint - Presently without insurance. Wife does work for [employer] reach out to social work team for financial resources and possible [employer] he worries about affording insulin.   DM1 - 11/23/20 A1c 12.4 and repeat 03/01/21 8.4. Following with primary care. Congratulated on improved control.   Disposition: Follow up in 2 month(s) with SteVirl AxeD or APP.  Signed, CaiLoel DubonnetP 03/03/2021, 8:33 PM [facility]

## 2021-03-04 ENCOUNTER — Telehealth: Payer: Self-pay | Admitting: Licensed Clinical Social Worker

## 2021-03-04 ENCOUNTER — Other Ambulatory Visit: Payer: Self-pay

## 2021-03-04 NOTE — Telephone Encounter (Signed)
LCSW received referral for pt regarding financial difficulties related to lost employment/potential coverage loss. LCSW called card on file eligibility line- for [employer], [phone]. Spoke with representative who confirmed pt still has current coverage. She was unable to tell me if pt had a change in employment status when coverage would terminate. She directed me to tell pt to call his employer and ask. LCSW called pt at [phone]. Introduced self, role, reason for call. We discussed why pt was referred and the above information. He shares he hasnt been back to work since his hospitalization so assumed his coverage was termed. LCSW encouraged him to call employer who his insurance was under and see if they can clarify how long his benefits extend for. He will do so after taking his child to school and then call this writer back with any clarification.   Octavio Graves, MSW, LCSW [facility] Navigation  [phone]

## 2021-03-07 NOTE — Addendum Note (Signed)
Addended by: Barrie Dunker on: 03/07/2021 08:17 AM   Modules accepted: Orders

## 2021-03-08 NOTE — Telephone Encounter (Signed)
Left message with PET scan department at [facility] to call back and provide referral documents.

## 2021-03-09 ENCOUNTER — Telehealth: Payer: Self-pay | Admitting: Licensed Clinical Social Worker

## 2021-03-09 NOTE — Telephone Encounter (Signed)
LCSW reached back out to pt to see if he had called his employer yet about his benefits. Pt states he called the number on the card (which I had already shared had been done w/ him); and he was told to call his employer. He has not been able to get in touch with his previous employer, encouraged him to call the company after the holiday and at this time continue to bring his card to appointments and the pharmacy. I will f/u at the end of next week to see if any more updates. Once we know when pt coverage may lapse we can assist further with assistance applications.   [name], MSW, LCSW Clinical Social Worker II [facility] Navigation  [phone]- work cell phone (preferred) [phone]- desk phone

## 2021-03-14 ENCOUNTER — Other Ambulatory Visit: Payer: Self-pay

## 2021-03-18 ENCOUNTER — Other Ambulatory Visit (HOSPITAL_COMMUNITY): Payer: Self-pay | Admitting: *Deleted

## 2021-03-18 ENCOUNTER — Telehealth (HOSPITAL_COMMUNITY): Payer: Self-pay | Admitting: *Deleted

## 2021-03-18 ENCOUNTER — Other Ambulatory Visit: Payer: Self-pay

## 2021-03-18 DIAGNOSIS — Z01812 Encounter for preprocedural laboratory examination: Secondary | ICD-10-CM

## 2021-03-18 MED ORDER — METOPROLOL TARTRATE 100 MG PO TABS
ORAL_TABLET | ORAL | 0 refills | Status: DC
  Filled 2021-03-18: qty 1, 1d supply, fill #0

## 2021-03-18 NOTE — Telephone Encounter (Signed)
Reaching out to patient to offer assistance regarding upcoming cardiac imaging study; pt verbalizes understanding of appt date/time, parking situation and where to check in, pre-test NPO status and medications ordered, and verified current allergies; name and call back number provided for further questions should they arise  Larey Brick RN Navigator Cardiac Imaging [facility] [phone] office [phone] cell  Per Dr Flora Lipps after chart review, pt is prescribed 100mg  metoprolol tartrate two hours prior to cardiac CT scan. He is aware to obtain blood work prior to scan and to arrive at 2:45pm for his 3:15pm scan. He states he is a difficult IV start.

## 2021-03-21 ENCOUNTER — Other Ambulatory Visit (INDEPENDENT_AMBULATORY_CARE_PROVIDER_SITE_OTHER): Payer: Self-pay | Admitting: Primary Care

## 2021-03-21 ENCOUNTER — Other Ambulatory Visit: Payer: Self-pay

## 2021-03-21 DIAGNOSIS — Z760 Encounter for issue of repeat prescription: Secondary | ICD-10-CM

## 2021-03-21 MED ORDER — INSULIN ASPART 100 UNIT/ML IJ SOLN
INTRAMUSCULAR | 0 refills | Status: DC
  Filled 2021-03-21 – 2021-04-01 (×2): qty 10, 28d supply, fill #0

## 2021-03-21 NOTE — Telephone Encounter (Signed)
Sent to PCP to refill  

## 2021-03-22 ENCOUNTER — Other Ambulatory Visit: Payer: Self-pay

## 2021-03-22 ENCOUNTER — Ambulatory Visit (HOSPITAL_COMMUNITY)
Admission: RE | Admit: 2021-03-22 | Discharge: 2021-03-22 | Disposition: A | Discharge status: Home / Self Care | Payer: No Typology Code available for payment source | Source: Ambulatory Visit | Attending: Cardiology | Admitting: Cardiology

## 2021-03-22 ENCOUNTER — Encounter (HOSPITAL_COMMUNITY): Payer: Self-pay

## 2021-03-22 ENCOUNTER — Telehealth: Payer: Self-pay | Admitting: Licensed Clinical Social Worker

## 2021-03-22 DIAGNOSIS — R931 Abnormal findings on diagnostic imaging of heart and coronary circulation: Secondary | ICD-10-CM | POA: Diagnosis present

## 2021-03-22 DIAGNOSIS — I253 Aneurysm of heart: Secondary | ICD-10-CM | POA: Insufficient documentation

## 2021-03-22 LAB — POCT I-STAT CREATININE: Creatinine, Ser: 1.1 mg/dL (ref 0.61–1.24)

## 2021-03-22 MED ORDER — NITROGLYCERIN 0.4 MG SL SUBL: 0.8000 mg | SUBLINGUAL_TABLET | Freq: Once | SUBLINGUAL | Status: DC

## 2021-03-22 MED ORDER — METOPROLOL TARTRATE 5 MG/5ML IV SOLN
10.0000 mg | INTRAVENOUS | Status: DC | PRN
  Administered 2021-03-22: 10 mg via INTRAVENOUS

## 2021-03-22 MED ORDER — DILTIAZEM HCL 25 MG/5ML IV SOLN: 10.0000 mg | INTRAVENOUS | Status: DC | PRN

## 2021-03-22 MED ORDER — DILTIAZEM HCL 25 MG/5ML IV SOLN
INTRAVENOUS | Status: AC
  Administered 2021-03-22: 10 mg via INTRAVENOUS
  Filled 2021-03-22: qty 5

## 2021-03-22 MED ORDER — SODIUM CHLORIDE 0.9 % IV BOLUS: 250.0000 mL | Freq: Once | INTRAVENOUS | Status: DC

## 2021-03-22 MED ORDER — METOPROLOL TARTRATE 5 MG/5ML IV SOLN
INTRAVENOUS | Status: AC
  Filled 2021-03-22: qty 20

## 2021-03-22 NOTE — Telephone Encounter (Signed)
LCSW reached out to pt today- he confirmed he is aware of CT and will bring his insurance card. I shared w/ him that per financial counselor Christia Reading that he may be eligible for hardship assistance given large amount of outstanding bills from before he had insurance. Pt interested in completing application- I confirmed home address and mailed it to him. He was encouraged to call me back if any additional questions/concerns or if he would like formal assistance w/ completing the application.   [name], MSW, LCSW Clinical Social Worker II [facility] Navigation  [phone]- work cell phone (preferred) [phone]- desk phone

## 2021-03-22 NOTE — Progress Notes (Addendum)
Upon arrival HR 86 BP 118/80. Pt premedicated with Metoprolol 100mg  PO. Pt given Metoprolol 10mg  IV and Diltiazem 10mg  IV. BP 89/63 HR 72. Pts EF 40-45%. 250cc NS bolus IV given. BP 94/66 HR 72. Dr. made aware and scan canceled. She like pt premedicated with Ivabradine. , RN aware. Pt verbalizes understanding of plan of care. Pt without complains. Discharged to self.

## 2021-03-23 ENCOUNTER — Other Ambulatory Visit: Payer: Self-pay

## 2021-03-23 ENCOUNTER — Ambulatory Visit: Payer: MEDICAID | Attending: Primary Care | Admitting: Pharmacist

## 2021-03-23 DIAGNOSIS — Z760 Encounter for issue of repeat prescription: Secondary | ICD-10-CM | POA: Diagnosis not present

## 2021-03-23 DIAGNOSIS — E1069 Type 1 diabetes mellitus with other specified complication: Secondary | ICD-10-CM

## 2021-03-23 MED ORDER — INSULIN GLARGINE 100 UNIT/ML ~~LOC~~ SOLN
30.0000 [IU] | Freq: Every day | SUBCUTANEOUS | 3 refills | Status: DC
  Filled 2021-03-23: qty 10, 28d supply, fill #0
  Filled 2021-04-01: qty 10, 33d supply, fill #0
  Filled 2021-04-15: qty 10, 28d supply, fill #0
  Filled 2021-05-18: qty 10, 28d supply, fill #1
  Filled 2021-05-19: qty 10, 28d supply, fill #0

## 2021-03-23 NOTE — Progress Notes (Signed)
    S:    PCP: Michelle   Patient arrives in good spirits.  Presents for diabetes evaluation, education, and management. Patient was referred and last seen by Primary Care Provider on 03/01/2021.   Patient reports Diabetes was diagnosed around age 45. Dx as type 1. Was hospitalized with DKA and started on basal/bolus regimen. Per pt, he has never used an insulin pump.   Family/Social History:  -Fhx: DM -Tobacco: current 0.5 PPD smoker  -Alcohol: denies use currently   Insurance coverage/medication affordability: Dnbi   Medication adherence reported.   Current diabetes medications include: Lantus 25 units daily, Novolog per sliding scale   Patient denies hypoglycemic events. Becomes aware once CBGs at home drop to below 100. Never sees anything <70.   Patient reported dietary habits: -Snacks during the day -Will keep apple juice, candy bars with him in case his sugar "drops"   Patient-reported exercise habits:  - Walks ~30 minutes daily    Patient denies nocturia (nighttime urination).  Patient denies neuropathy (nerve pain). Patient denies visual changes. Patient reports self foot exams.     O:  Physical Exam  ROS  Lab Results  Component Value Date   HGBA1C 8.4 (A) 03/01/2021   There were no vitals filed for this visit.  Lipid Panel     Component Value Date/Time   CHOL 101 12/06/2020 0330   CHOL 191 01/28/2018 1205   TRIG 34 12/06/2020 0330   HDL 62 12/06/2020 0330   HDL 107 01/28/2018 1205   CHOLHDL 1.6 12/06/2020 0330   VLDL 7 12/06/2020 0330   LDLCALC 32 12/06/2020 0330   LDLCALC 69 01/28/2018 1205   Home fasting blood sugars: reports high 100s-200s. No meter with him today.   2 hour post-meal/random blood sugars: reports 200s.   Clinical Atherosclerotic Cardiovascular Disease (ASCVD): No  The ASCVD Risk score (Arnett DK, et al., 2019) failed to calculate for the following reasons:   The valid total cholesterol range is 130 to 320 mg/dL     A/P: Diabetes longstanding currently uncontrolled. Patient is able to verbalize appropriate hypoglycemia management plan. Medication adherence appears okay. -Increased dose of Lantus to 30 units daily.  -Continue sliding scale.  -Advised patient to eat regular meals instead of snacking  -Extensively discussed pathophysiology of diabetes, recommended lifestyle interventions, dietary effects on blood sugar control -Counseled on s/sx of and management of hypoglycemia -Next A1C anticipated 05/2021.   Written patient instructions provided.  Total time in face to face counseling 15 minutes.   Follow up PCP Clinic Visit Feb.   Butch Penny, PharmD, [place], CPP Clinical Pharmacist [facility] [phone]

## 2021-03-23 NOTE — Telephone Encounter (Signed)
Documents have been faxed and confirmation received.

## 2021-03-24 ENCOUNTER — Telehealth: Payer: Self-pay | Admitting: Cardiology

## 2021-03-24 DIAGNOSIS — R931 Abnormal findings on diagnostic imaging of heart and coronary circulation: Secondary | ICD-10-CM

## 2021-03-24 DIAGNOSIS — I253 Aneurysm of heart: Secondary | ICD-10-CM

## 2021-03-24 NOTE — Telephone Encounter (Signed)
New Message:    [name] from [facility] called. He wanted Dr Mayford Knife to know that patient can not undergo Protocol  due to Insulin dependency related to diabetes.

## 2021-03-29 ENCOUNTER — Other Ambulatory Visit: Payer: Self-pay

## 2021-03-29 NOTE — Telephone Encounter (Signed)
Scheduled patient for lab work.

## 2021-03-31 ENCOUNTER — Other Ambulatory Visit: Payer: No Typology Code available for payment source

## 2021-04-01 ENCOUNTER — Other Ambulatory Visit: Payer: Self-pay

## 2021-04-01 ENCOUNTER — Other Ambulatory Visit: Payer: Self-pay | Admitting: Family Medicine

## 2021-04-04 ENCOUNTER — Other Ambulatory Visit: Payer: Self-pay

## 2021-04-04 ENCOUNTER — Other Ambulatory Visit: Payer: Self-pay | Admitting: Family Medicine

## 2021-04-04 MED ORDER — "INSULIN SYRINGE-NEEDLE U-100 31G X 5/16"" 0.3 ML MISC"
1.0000 | 0 refills | Status: DC | PRN
  Filled 2021-04-04: qty 100, 25d supply, fill #0

## 2021-04-04 NOTE — Telephone Encounter (Signed)
Requested medication (s) are due for refill today: yes  Requested medication (s) are on the active medication list: yes  Last refill:  11/23/20 #100/0RF  Future visit scheduled: no  Notes to clinic:  Unable to refill per protocol, medication not assigned to the refill protocol. Filled by another provider      Requested Prescriptions  Pending Prescriptions Disp Refills   Insulin Syringe-Needle U-100 (TRUEPLUS INSULIN SYRINGE) 31G X 5/16" 0.3 ML MISC 100 each 0    Sig: [place] as directed as needed.     There is no refill protocol information for this order

## 2021-04-06 ENCOUNTER — Other Ambulatory Visit (HOSPITAL_COMMUNITY): Payer: Self-pay | Admitting: Cardiology

## 2021-04-06 ENCOUNTER — Other Ambulatory Visit: Payer: Self-pay | Admitting: Cardiology

## 2021-04-06 ENCOUNTER — Other Ambulatory Visit: Payer: Self-pay

## 2021-04-08 ENCOUNTER — Other Ambulatory Visit: Payer: Self-pay

## 2021-04-15 ENCOUNTER — Other Ambulatory Visit: Payer: Self-pay

## 2021-04-15 ENCOUNTER — Other Ambulatory Visit: Payer: Self-pay | Admitting: Primary Care

## 2021-04-15 NOTE — Telephone Encounter (Signed)
Requested medication (s) are due for refill today:   No It was dispensed on 04/06/2021  Requested medication (s) are on the active medication list:   Yes  Future visit scheduled:   No   Last ordered: 04/04/2021 #100 each, 0 refills  Returned because there is no protocol assigned to this.  Received a duplicate request.   Attempted to call pharmacy but no answer to inquire why a duplicate request was sent.     Requested Prescriptions  Pending Prescriptions Disp Refills   Insulin Syringe-Needle U-100 (TRUEPLUS INSULIN SYRINGE) 31G X 5/16" 0.3 ML MISC 100 each 0    Sig: [place] as directed as needed.     There is no refill protocol information for this order

## 2021-04-19 ENCOUNTER — Other Ambulatory Visit: Payer: Self-pay

## 2021-04-19 MED ORDER — "INSULIN SYRINGE-NEEDLE U-100 31G X 5/16"" 0.3 ML MISC"
1.0000 | 0 refills | Status: DC | PRN
  Filled 2021-04-19 – 2021-05-19 (×3): qty 100, 25d supply, fill #0

## 2021-04-21 ENCOUNTER — Other Ambulatory Visit (HOSPITAL_COMMUNITY): Payer: Self-pay | Admitting: *Deleted

## 2021-04-21 ENCOUNTER — Telehealth (HOSPITAL_COMMUNITY): Payer: Self-pay | Admitting: *Deleted

## 2021-04-21 ENCOUNTER — Telehealth (HOSPITAL_COMMUNITY): Payer: Self-pay | Admitting: Emergency Medicine

## 2021-04-21 MED ORDER — METOPROLOL TARTRATE 100 MG PO TABS: ORAL_TABLET | ORAL | 0 refills | Status: DC

## 2021-04-21 NOTE — Telephone Encounter (Signed)
Patient returning call regarding upcoming cardiac imaging study; pt verbalizes understanding of appt date/time, parking situation and where to check in, pre-test NPO status and medications ordered, and verified current allergies; name and call back number provided for further questions should they arise  Larey Brick RN Navigator Cardiac Imaging [facility] [phone] office [phone] cell  Patient to hold his daily labetolol and take 100mg  metoprolol tartrate two hours prior to cardiac CT scan. He is aware to arrive at 2:45pm for his 3:15pm scan.

## 2021-04-21 NOTE — Telephone Encounter (Signed)
Attempted to call patient regarding upcoming cardiac CT appointment. °Left message on voicemail with name and callback number °Lyndsie Wallman RN Navigator Cardiac Imaging °[facility] °[phone] Office °[phone] Cell ° °

## 2021-04-22 ENCOUNTER — Other Ambulatory Visit: Payer: Self-pay

## 2021-04-22 ENCOUNTER — Other Ambulatory Visit (HOSPITAL_COMMUNITY): Payer: Self-pay

## 2021-04-22 ENCOUNTER — Ambulatory Visit (HOSPITAL_COMMUNITY)
Admission: RE | Admit: 2021-04-22 | Discharge: 2021-04-22 | Disposition: A | Discharge status: Home / Self Care | Payer: No Typology Code available for payment source | Source: Ambulatory Visit | Attending: Cardiology | Admitting: Cardiology

## 2021-04-22 DIAGNOSIS — R931 Abnormal findings on diagnostic imaging of heart and coronary circulation: Secondary | ICD-10-CM

## 2021-04-22 DIAGNOSIS — I253 Aneurysm of heart: Secondary | ICD-10-CM

## 2021-04-22 MED ORDER — NITROGLYCERIN 0.4 MG SL SUBL
SUBLINGUAL_TABLET | SUBLINGUAL | Status: AC
  Filled 2021-04-22: qty 2

## 2021-04-22 MED ORDER — IOHEXOL 350 MG/ML SOLN
100.0000 mL | Freq: Once | INTRAVENOUS | Status: AC | PRN
  Administered 2021-04-22: 100 mL via INTRAVENOUS

## 2021-04-22 MED ORDER — DILTIAZEM HCL 25 MG/5ML IV SOLN: 10.0000 mg | Freq: Once | INTRAVENOUS | Status: AC

## 2021-04-22 MED ORDER — METOPROLOL TARTRATE 5 MG/5ML IV SOLN: 10.0000 mg | Freq: Once | INTRAVENOUS | Status: AC

## 2021-04-22 MED ORDER — NITROGLYCERIN 0.4 MG SL SUBL: 0.8000 mg | SUBLINGUAL_TABLET | Freq: Once | SUBLINGUAL | Status: DC

## 2021-04-22 MED ORDER — METOPROLOL TARTRATE 5 MG/5ML IV SOLN
INTRAVENOUS | Status: AC
  Administered 2021-04-22: 10 mg via INTRAVENOUS
  Filled 2021-04-22: qty 10

## 2021-04-22 MED ORDER — DILTIAZEM HCL 25 MG/5ML IV SOLN
INTRAVENOUS | Status: AC
  Administered 2021-04-22: 10 mg via INTRAVENOUS
  Filled 2021-04-22: qty 5

## 2021-04-24 ENCOUNTER — Encounter: Payer: Self-pay | Admitting: Cardiology

## 2021-04-25 ENCOUNTER — Telehealth (HOSPITAL_BASED_OUTPATIENT_CLINIC_OR_DEPARTMENT_OTHER): Payer: Self-pay

## 2021-04-25 DIAGNOSIS — I509 Heart failure, unspecified: Secondary | ICD-10-CM

## 2021-04-25 NOTE — Telephone Encounter (Signed)
Called patient to inform him of the referral to Advanced heart Failure clinic. No answer, left message letting him know that I am placing the referral and someone will reach out to him to get him seen!

## 2021-05-04 ENCOUNTER — Telehealth: Payer: Self-pay | Admitting: Licensed Clinical Social Worker

## 2021-05-04 NOTE — Telephone Encounter (Signed)
LCSW has not heard back from pt regarding mailed CAFA application. I noted upcoming AHF clinic appointment tomorrow. Called [employer] on chart. Pt coverage termed 01/15/2021. Pt should likely be eligible for [employer]. Let LCSW Eileen Stanford know and made note on appointment for f/u tomorrow should pt attend appt.    Octavio Graves, MSW, LCSW Clinical Social Worker II [facility] Navigation  [phone]- work cell phone (preferred) [phone]- desk phone

## 2021-05-05 ENCOUNTER — Ambulatory Visit (HOSPITAL_COMMUNITY)
Admission: RE | Admit: 2021-05-05 | Discharge: 2021-05-05 | Disposition: A | Discharge status: Home / Self Care | Payer: No Typology Code available for payment source | Source: Ambulatory Visit | Attending: Cardiology | Admitting: Cardiology

## 2021-05-05 ENCOUNTER — Other Ambulatory Visit: Payer: Self-pay

## 2021-05-05 ENCOUNTER — Encounter (HOSPITAL_COMMUNITY): Payer: Self-pay | Admitting: Cardiology

## 2021-05-05 VITALS — BP 122/70 | HR 89 | Wt 141.2 lb

## 2021-05-05 DIAGNOSIS — I509 Heart failure, unspecified: Secondary | ICD-10-CM

## 2021-05-05 DIAGNOSIS — I110 Hypertensive heart disease with heart failure: Secondary | ICD-10-CM | POA: Insufficient documentation

## 2021-05-05 DIAGNOSIS — F1411 Cocaine abuse, in remission: Secondary | ICD-10-CM | POA: Insufficient documentation

## 2021-05-05 DIAGNOSIS — Z8616 Personal history of COVID-19: Secondary | ICD-10-CM | POA: Insufficient documentation

## 2021-05-05 DIAGNOSIS — E109 Type 1 diabetes mellitus without complications: Secondary | ICD-10-CM | POA: Insufficient documentation

## 2021-05-05 DIAGNOSIS — Q2113 Coronary sinus atrial septal defect: Secondary | ICD-10-CM | POA: Insufficient documentation

## 2021-05-05 DIAGNOSIS — Q248 Other specified congenital malformations of heart: Secondary | ICD-10-CM

## 2021-05-05 DIAGNOSIS — I5022 Chronic systolic (congestive) heart failure: Secondary | ICD-10-CM

## 2021-05-05 DIAGNOSIS — Z79899 Other long term (current) drug therapy: Secondary | ICD-10-CM | POA: Insufficient documentation

## 2021-05-05 DIAGNOSIS — I428 Other cardiomyopathies: Secondary | ICD-10-CM | POA: Insufficient documentation

## 2021-05-05 DIAGNOSIS — F17210 Nicotine dependence, cigarettes, uncomplicated: Secondary | ICD-10-CM | POA: Insufficient documentation

## 2021-05-05 LAB — CBC
HCT: 39.6 % (ref 39.0–52.0)
Hemoglobin: 13.6 g/dL (ref 13.0–17.0)
MCH: 31.1 pg (ref 26.0–34.0)
MCHC: 34.3 g/dL (ref 30.0–36.0)
MCV: 90.6 fL (ref 80.0–100.0)
Platelets: 311 10*3/uL (ref 150–400)
RBC: 4.37 MIL/uL (ref 4.22–5.81)
RDW: 13.5 % (ref 11.5–15.5)
WBC: 7.7 10*3/uL (ref 4.0–10.5)
nRBC: 0 % (ref 0.0–0.2)

## 2021-05-05 LAB — ACETAMINOPHEN LEVEL: Acetaminophen (Tylenol), Serum: <10 ug/mL — ABNORMAL LOW (ref 10–30)

## 2021-05-05 LAB — BASIC METABOLIC PANEL
Anion gap: 8 (ref 5–15)
BUN: 12 mg/dL (ref 6–20)
CO2: 27 mmol/L (ref 22–32)
Calcium: 8.9 mg/dL (ref 8.9–10.3)
Chloride: 104 mmol/L (ref 98–111)
Creatinine, Ser: 1.06 mg/dL (ref 0.61–1.24)
GFR, Estimated: >60 mL/min (ref >60)
Glucose, Bld: 265 mg/dL — ABNORMAL HIGH (ref 70–99)
Potassium: 4.9 mmol/L (ref 3.5–5.1)
Sodium: 139 mmol/L (ref 135–145)

## 2021-05-05 LAB — BRAIN NATRIURETIC PEPTIDE: B Natriuretic Peptide: 21 pg/mL (ref 0.0–100.0)

## 2021-05-05 MED ORDER — CARVEDILOL 6.25 MG PO TABS
6.2500 mg | ORAL_TABLET | Freq: Two times a day (BID) | ORAL | 11 refills | Status: DC
  Filled 2021-05-05: qty 60, 30d supply, fill #0
  Filled 2021-06-03: qty 60, 30d supply, fill #1
  Filled 2021-07-01: qty 180, 90d supply, fill #2
  Filled 2021-08-09: qty 180, 90d supply, fill #3
  Filled 2021-09-07 – 2021-09-26 (×2): qty 180, 90d supply, fill #4
  Filled 2021-10-21: qty 60, 30d supply, fill #5

## 2021-05-05 MED ORDER — ENTRESTO 24-26 MG PO TABS
1.0000 | ORAL_TABLET | Freq: Two times a day (BID) | ORAL | 11 refills | Status: DC
  Filled 2021-05-05: qty 60, 30d supply, fill #0

## 2021-05-05 NOTE — Patient Instructions (Signed)
STOP Labetalol STOP Losartan  START Carvedilol 6.25 mg, one tab twice a day START Entresto 24/26 mg, one tab twice a day  Labs today We will only contact you if something comes back abnormal or we need to make some changes. Otherwise no news is good news!  Your physician recommends that you schedule a follow-up appointment in: 3 weeks with Dr Shirlee Latch   Do the following things EVERYDAY: Weigh yourself in the morning before breakfast. Write it down and keep it in a log. Take your medicines as prescribed Eat low salt foods--Limit salt (sodium) to 2000 mg per day.  Stay as active as you can everyday Limit all fluids for the day to less than 2 liters  At the Advanced Heart Failure Clinic, you and your health needs are our priority. As part of our continuing mission to provide you with exceptional heart care, we have created designated Provider Care Teams. These Care Teams include your primary Cardiologist (physician) and Advanced Practice Providers (APPs- Physician Assistants and Nurse Practitioners) who all work together to provide you with the care you need, when you need it.   You may see any of the following providers on your designated Care Team at your next follow up: Dr Arvilla Meres Dr Carron Curie, NP Robbie Lis, [place] [facility], [place] Karle Plumber, PharmD   Please be sure to bring in all your medications bottles to every appointment.         You are scheduled for a Cardiac Catheterization on Friday, January 27 with Dr. Marca Ancona.  1. Please arrive at the [facility] (Main Entrance A) at [facility]: [address] [place], [place] [address] at 10:00 AM (This time is two hours before your procedure to ensure your preparation). Free valet parking service is available.   Special note: Every effort is made to have your procedure done on time. Please understand that emergencies sometimes delay scheduled procedures.  2.  Diet: Do not eat solid foods after midnight.  The patient may have clear liquids until 5am upon the day of the procedure.  3. Labs: Pre Procedure labs done 05/05/21  4. Medication instructions in preparation for your procedure:   Contrast Allergy: No   Take only 15 units of insulin the night before your procedure. Do not take any insulin on the day of the procedure.    On the morning of your procedure, take your Aspirin and any morning medicines NOT listed above.  You may use sips of water.  5. Plan for one night stay--bring personal belongings. 6. Bring a current list of your medications and current insurance cards. 7. You MUST have a responsible person to drive you home. 8. Someone MUST be with you the first 24 hours after you arrive home or your discharge will be delayed. 9. Please wear clothes that are easy to get on and off and wear slip-on shoes.  Thank you for allowing [place] to care for you!   -- [place] [facility]

## 2021-05-06 ENCOUNTER — Other Ambulatory Visit (HOSPITAL_COMMUNITY): Payer: Self-pay

## 2021-05-06 DIAGNOSIS — I5022 Chronic systolic (congestive) heart failure: Secondary | ICD-10-CM

## 2021-05-06 NOTE — H&P (View-Only) (Signed)
PCP: Kerin Perna, NP Cardiology: Dr. Radford Pax HF Cardiology: Dr. Aundra Dubin  46 y.o. 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  Patient says that though he was very active when young (played football and baseball), he always felt like he tired out too easily. Over time, he has developed gradually worsening exertional dyspnea.  In 8/22, he was found unresponsive with opiates in his urine.  He says that he thought he was getting cocaine, but apparently someone gave him fentanyl.  He got Narcan and was briefly in atrial fibrillation.  No recurrent of AF since that time.  He was also found to have COVID-19 PNA during this admission. Echo in 8/22 showed EF 40-45%, apical aneurysm, septal hypokinesis. In 11/22, cardiac MRI was done.  This showed LV EF 40% with septal HK, small outpouching at LV apex consistent with congenital apical diverticulum (not pseudoaneurysm, no LGE to suggest aneurysm related to MI), septal mid-wall LGE, RV EF 55% with moderate RV dilation, dilated coronary sinus. Given concern for unroofed coronary sinus, coronary CTA was done in 1/23.  This showed no significant CAD, apical aneurysm, and unroofed coronary sinus with evidence for significant left to right shunt.   Patient reports quite significant dyspnea at this point.  He is short of breath walking around his house.  He got quite short of breath walking in the office today.  No orthopnea/PND.  No chest pain.  No lightheadedness. He is no longer using cocaine but still smokes.   ECG (personally reviewed): NSR, right axis deviation.   PMH: 1. Type 1 diabetes: Since age 50 2. HTN 3. Depression 4. H/o COVID-19 PNA 5. Active smoker 6. Prior cocaine 7. Atrial fibrillation: Paroxysmal, only noted brief run with respiratory arrest in 8/22.  8. Cardiomyopathy: Echo (8/22) with EF  40-45%, apical aneurysm, septal hypokinesis.  - Coronary CTA (1/23): Calcium score 0, no significant CAD, unroofed coronary since noted without persistent left SVC.  - Cardiac MRI (11/22): LV EF 40% with septal HK, small outpouching at LV apex consistent with congenital apical diverticulum (not pseudoaneurysm, no LGE to suggest aneurysm related to MI), septal mid-wall LGE, RV EF 55% with moderate RV dilation, dilated coronary sinus.  9. Congenital apical diverticulum: No LGE at the apex on 11/22 cMRI, so unlikely to be infarct-related aneurysm, and not consistent with pseudoaneurysm.  10. Unroofed coronary sinus: Confirmed by coronary CTA in 1/23. No associated persistent left SVC.   SH: Married, used to work at [employer] but now out of work.  Smokes < 1 ppd. Uses marijuana.  Prior cocaine.   Family History  Problem Relation Age of Onset   Arthritis Mother    Diabetes Mellitus II Father    ROS: All systems reviewed and negative except as per HPI.   Current Outpatient Medications  Medication Sig Dispense Refill   aspirin EC 81 MG EC tablet Take 1 tablet (81 mg total) by mouth daily. Swallow whole. 30 tablet 0   Blood Glucose Monitoring Suppl (TRUE METRIX METER) w/Device KIT Use kit to check blood glucose up to four times daily as directed 1 kit 0   carvedilol (COREG) 6.25 MG tablet Take 1 tablet (6.25 mg total) by mouth 2 (two) times daily. 60 tablet 11   glucose blood (TRUE METRIX BLOOD GLUCOSE TEST) test strip Use as instructed 100 each 12   insulin aspart (NOVOLOG) 100  UNIT/ML injection For blood sugars 0-150 give 0 units of insulin, 201-250 give 4 units, 251-300 give 6 units, 301-350 give 8 units, 351-400 give 10 units,> 400 give 12 units and call M.D. 10 mL 0   insulin glargine (LANTUS) 100 UNIT/ML injection Inject 0.3 mLs (30 Units total) into the skin at bedtime. 10 mL 3   Insulin Syringe-Needle U-100 (TRUEPLUS INSULIN SYRINGE) 31G X 5/16" 0.3 ML MISC [place] as directed as needed.  100 each 0   Ipratropium-Albuterol (COMBIVENT) 20-100 MCG/ACT AERS respimat Inhale 1 puff by mouth into the lungs every 6 hours as needed for wheezing. 4 g 0   sacubitril-valsartan (ENTRESTO) 24-26 MG Take 1 tablet by mouth 2 (two) times daily. 60 tablet 11   TRUEplus Lancets 28G MISC Use as directed 100 each 0   No current facility-administered medications for this encounter.   BP 122/70    Pulse 89    Wt 64 kg (141 lb 3.2 oz)    SpO2 100%    BMI 21.47 kg/m  General: NAD Neck: JVP 8 cm, no thyromegaly or thyroid nodule.  Lungs: Clear to auscultation bilaterally with normal respiratory effort. CV: Nondisplaced PMI.  Heart regular S1/S2, no S3/S4, no murmur.  No peripheral edema.  No carotid bruit.  Normal pedal pulses.  Abdomen: Soft, nontender, no hepatosplenomegaly, no distention.  Skin: Intact without lesions or rashes.  Neurologic: Alert and oriented x 3.  Psych: Normal affect. Extremities: No clubbing or cyanosis.  HEENT: Normal.   Assessment/Plan: 1. Unroofed coronary sinus: There was concern for this on 11/22 cMRI, it was confirmed by coronary CTA.  There appears to be significant left to right shunting on CT. There was not an associated persistent left SVC.  The RV had normal systolic function but was moderately dilated on cMRI. I suspect that some of his symptomatology is related to his left to right shunting with CHF.  - I will arrange for RHC to assess shunt fraction.  - He will likely need surgical correction of the unroofed coronary sinus due to left to right shunting.  - Check BNP.  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 No CAD on coronary CTA and no MI-pattern LGE at the apex.  It does not appear to be a pseudoaneurysm. Congenital LV apical diverticulum is often associated with other abnormalities (in this case, unroofed CS).   3. Chronic systolic CHF: Echo with EF 40-45% in 8/22, cMRI with LV EF 40% in 11/22.  There was septal mid-wall  LGE.  No CAD on coronary CTA.  Nonischemic cardiomyopathy, uncertain etiology.  Possible prior myocarditis versus sarcoidosis.  No evidence for pulmonary sarcoidosis on 1/23 coronary CTA. He is not volume overloaded on exam.  NYHA class III symptoms, likely related to L>R shunting primarily.  - Stop labetalol, start Coreg 6.25 mg bid.  - Stop losartan, start Entresto 24/26 bid.  BMET/BNP today and BMET in 10 days.  - Check ACE level.  4. Type 1 diabetes: Per PCP.   Loralie Champagne 05/06/2021

## 2021-05-06 NOTE — Progress Notes (Signed)
PCP: Edwards, Michelle P, NP °Cardiology: Dr. Turner °HF Cardiology: Dr. Alric Geise ° °45 y.o. 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  Patient says that though he was very active when young (played football and baseball), he always felt like he tired out too easily. Over time, he has developed gradually worsening exertional dyspnea.  In 8/22, he was found unresponsive with opiates in his urine.  He says that he thought he was getting cocaine, but apparently someone gave him fentanyl.  He got Narcan and was briefly in atrial fibrillation.  No recurrent of AF since that time.  He was also found to have COVID-19 PNA during this admission. Echo in 8/22 showed EF 40-45%, apical aneurysm, septal hypokinesis. In 11/22, cardiac MRI was done.  This showed LV EF 40% with septal HK, small outpouching at LV apex consistent with congenital apical diverticulum (not pseudoaneurysm, no LGE to suggest aneurysm related to MI), septal mid-wall LGE, RV EF 55% with moderate RV dilation, dilated coronary sinus. Given concern for unroofed coronary sinus, coronary CTA was done in 1/23.  This showed no significant CAD, apical aneurysm, and unroofed coronary sinus with evidence for significant left to right shunt.  ° °Patient reports quite significant dyspnea at this point.  He is short of breath walking around his house.  He got quite short of breath walking in the office today.  No orthopnea/PND.  No chest pain.  No lightheadedness. He is no longer using cocaine but still smokes.  ° °ECG (personally reviewed): NSR, right axis deviation.  ° °PMH: °1. Type 1 diabetes: Since age 24 °2. HTN °3. Depression °4. H/o COVID-19 PNA °5. Active smoker °6. Prior cocaine °7. Atrial fibrillation: Paroxysmal, only noted brief run with respiratory arrest in 8/22.  °8. Cardiomyopathy: Echo (8/22) with EF  40-45%, apical aneurysm, septal hypokinesis.  °- Coronary CTA (1/23): Calcium score 0, no significant CAD, unroofed coronary since noted without persistent left SVC.  °- Cardiac MRI (11/22): LV EF 40% with septal HK, small outpouching at LV apex consistent with congenital apical diverticulum (not pseudoaneurysm, no LGE to suggest aneurysm related to MI), septal mid-wall LGE, RV EF 55% with moderate RV dilation, dilated coronary sinus.  °9. Congenital apical diverticulum: No LGE at the apex on 11/22 cMRI, so unlikely to be infarct-related aneurysm, and not consistent with pseudoaneurysm.  °10. Unroofed coronary sinus: Confirmed by coronary CTA in 1/23. No associated persistent left SVC.  ° °SH: Married, used to work at [employer] but now out of work.  Smokes < 1 ppd. Uses marijuana.  Prior cocaine.  ° °Family History  °Problem Relation Age of Onset  ° Arthritis Mother   ° Diabetes Mellitus II Father   ° °ROS: All systems reviewed and negative except as per HPI.  ° °Current Outpatient Medications  °Medication Sig Dispense Refill  ° aspirin EC 81 MG EC tablet Take 1 tablet (81 mg total) by mouth daily. Swallow whole. 30 tablet 0  ° Blood Glucose Monitoring Suppl (TRUE METRIX METER) w/Device KIT Use kit to check blood glucose up to four times daily as directed 1 kit 0  ° carvedilol (COREG) 6.25 MG tablet Take 1 tablet (6.25 mg total) by mouth 2 (two) times daily. 60 tablet 11  ° glucose blood (TRUE METRIX BLOOD GLUCOSE TEST) test strip Use as instructed 100 each 12  ° insulin aspart (NOVOLOG) 100   UNIT/ML injection For blood sugars 0-150 give 0 units of insulin, 201-250 give 4 units, 251-300 give 6 units, 301-350 give 8 units, 351-400 give 10 units,> 400 give 12 units and call M.D. 10 mL 0  ° insulin glargine (LANTUS) 100 UNIT/ML injection Inject 0.3 mLs (30 Units total) into the skin at bedtime. 10 mL 3  ° Insulin Syringe-Needle U-100 (TRUEPLUS INSULIN SYRINGE) 31G X 5/16" 0.3 ML MISC [place] as directed as needed.  100 each 0  ° Ipratropium-Albuterol (COMBIVENT) 20-100 MCG/ACT AERS respimat Inhale 1 puff by mouth into the lungs every 6 hours as needed for wheezing. 4 g 0  ° sacubitril-valsartan (ENTRESTO) 24-26 MG Take 1 tablet by mouth 2 (two) times daily. 60 tablet 11  ° TRUEplus Lancets 28G MISC Use as directed 100 each 0  ° °No current facility-administered medications for this encounter.  ° °BP 122/70    Pulse 89    Wt 64 kg (141 lb 3.2 oz)    SpO2 100%    BMI 21.47 kg/m²  °General: NAD °Neck: JVP 8 cm, no thyromegaly or thyroid nodule.  °Lungs: Clear to auscultation bilaterally with normal respiratory effort. °CV: Nondisplaced PMI.  Heart regular S1/S2, no S3/S4, no murmur.  No peripheral edema.  No carotid bruit.  Normal pedal pulses.  °Abdomen: Soft, nontender, no hepatosplenomegaly, no distention.  °Skin: Intact without lesions or rashes.  °Neurologic: Alert and oriented x 3.  °Psych: Normal affect. °Extremities: No clubbing or cyanosis.  °HEENT: Normal.  ° °Assessment/Plan: °1. Unroofed coronary sinus: There was concern for this on 11/22 cMRI, it was confirmed by coronary CTA.  There appears to be significant left to right shunting on CT. There was not an associated persistent left SVC.  The RV had normal systolic function but was moderately dilated on cMRI. I suspect that some of his symptomatology is related to his left to right shunting with CHF.  °- I will arrange for RHC to assess shunt fraction.  °- He will likely need surgical correction of the unroofed coronary sinus due to left to right shunting.  °- Check BNP.  °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 No CAD on coronary CTA and no MI-pattern LGE at the apex.  It does not appear to be a pseudoaneurysm. Congenital LV apical diverticulum is often associated with other abnormalities (in this case, unroofed CS).   °3. Chronic systolic CHF: Echo with EF 40-45% in 8/22, cMRI with LV EF 40% in 11/22.  There was septal mid-wall  LGE.  No CAD on coronary CTA.  Nonischemic cardiomyopathy, uncertain etiology.  Possible prior myocarditis versus sarcoidosis.  No evidence for pulmonary sarcoidosis on 1/23 coronary CTA. He is not volume overloaded on exam.  NYHA class III symptoms, likely related to L>R shunting primarily.  °- Stop labetalol, start Coreg 6.25 mg bid.  °- Stop losartan, start Entresto 24/26 bid.  BMET/BNP today and BMET in 10 days.  °- Check ACE level.  °4. Type 1 diabetes: Per PCP.  ° °[name] °05/06/2021 ° °

## 2021-05-12 ENCOUNTER — Telehealth: Payer: Self-pay | Admitting: Licensed Clinical Social Worker

## 2021-05-12 NOTE — Telephone Encounter (Signed)
Attempted f/u with pt regarding financial assistance documents sent to him. No answer at home phone [phone] which is his phone (cell phone is listed as his spouse), will reattempt as able.   Octavio Graves, MSW, LCSW Clinical Social Worker II [facility] Navigation  [phone]- work cell phone (preferred) [phone]- desk phone

## 2021-05-13 ENCOUNTER — Ambulatory Visit (HOSPITAL_COMMUNITY)
Admission: RE | Admit: 2021-05-13 | Discharge: 2021-05-13 | Disposition: A | Discharge status: Home / Self Care | Payer: No Typology Code available for payment source | Attending: Cardiology | Admitting: Cardiology

## 2021-05-13 ENCOUNTER — Ambulatory Visit: Payer: No Typology Code available for payment source | Admitting: Internal Medicine

## 2021-05-13 ENCOUNTER — Encounter (HOSPITAL_COMMUNITY)
Admission: RE | Disposition: A | Discharge status: Home / Self Care | Payer: Self-pay | Source: Home / Self Care | Attending: Cardiology

## 2021-05-13 ENCOUNTER — Other Ambulatory Visit: Payer: Self-pay

## 2021-05-13 DIAGNOSIS — E109 Type 1 diabetes mellitus without complications: Secondary | ICD-10-CM | POA: Insufficient documentation

## 2021-05-13 DIAGNOSIS — I5022 Chronic systolic (congestive) heart failure: Secondary | ICD-10-CM

## 2021-05-13 DIAGNOSIS — Z794 Long term (current) use of insulin: Secondary | ICD-10-CM | POA: Insufficient documentation

## 2021-05-13 DIAGNOSIS — I509 Heart failure, unspecified: Secondary | ICD-10-CM | POA: Diagnosis not present

## 2021-05-13 DIAGNOSIS — Z79899 Other long term (current) drug therapy: Secondary | ICD-10-CM | POA: Insufficient documentation

## 2021-05-13 DIAGNOSIS — Q2113 Coronary sinus atrial septal defect: Secondary | ICD-10-CM | POA: Insufficient documentation

## 2021-05-13 DIAGNOSIS — I4891 Unspecified atrial fibrillation: Secondary | ICD-10-CM

## 2021-05-13 DIAGNOSIS — I428 Other cardiomyopathies: Secondary | ICD-10-CM | POA: Insufficient documentation

## 2021-05-13 DIAGNOSIS — Z8616 Personal history of COVID-19: Secondary | ICD-10-CM | POA: Insufficient documentation

## 2021-05-13 DIAGNOSIS — F17210 Nicotine dependence, cigarettes, uncomplicated: Secondary | ICD-10-CM | POA: Insufficient documentation

## 2021-05-13 DIAGNOSIS — I110 Hypertensive heart disease with heart failure: Secondary | ICD-10-CM | POA: Insufficient documentation

## 2021-05-13 HISTORY — PX: RIGHT HEART CATH: CATH118263

## 2021-05-13 LAB — POCT I-STAT EG7
Acid-Base Excess: 3 mmol/L — ABNORMAL HIGH (ref 0.0–2.0)
Acid-Base Excess: 3 mmol/L — ABNORMAL HIGH (ref 0.0–2.0)
Acid-Base Excess: 3 mmol/L — ABNORMAL HIGH (ref 0.0–2.0)
Acid-Base Excess: 3 mmol/L — ABNORMAL HIGH (ref 0.0–2.0)
Acid-Base Excess: 3 mmol/L — ABNORMAL HIGH (ref 0.0–2.0)
Bicarbonate: 27.9 mmol/L (ref 20.0–28.0)
Bicarbonate: 29.1 mmol/L — ABNORMAL HIGH (ref 20.0–28.0)
Bicarbonate: 28.3 mmol/L — ABNORMAL HIGH (ref 20.0–28.0)
Bicarbonate: 27.5 mmol/L (ref 20.0–28.0)
Bicarbonate: 28.7 mmol/L — ABNORMAL HIGH (ref 20.0–28.0)
Calcium, Ion: 1.16 mmol/L (ref 1.15–1.40)
Calcium, Ion: 1.18 mmol/L (ref 1.15–1.40)
Calcium, Ion: 1.2 mmol/L (ref 1.15–1.40)
Calcium, Ion: 1.18 mmol/L (ref 1.15–1.40)
Calcium, Ion: 1.2 mmol/L (ref 1.15–1.40)
HCT: 36 % — ABNORMAL LOW (ref 39.0–52.0)
HCT: 37 % — ABNORMAL LOW (ref 39.0–52.0)
HCT: 36 % — ABNORMAL LOW (ref 39.0–52.0)
HCT: 36 % — ABNORMAL LOW (ref 39.0–52.0)
HCT: 37 % — ABNORMAL LOW (ref 39.0–52.0)
Hemoglobin: 12.2 g/dL — ABNORMAL LOW (ref 13.0–17.0)
Hemoglobin: 12.6 g/dL — ABNORMAL LOW (ref 13.0–17.0)
Hemoglobin: 12.2 g/dL — ABNORMAL LOW (ref 13.0–17.0)
Hemoglobin: 12.2 g/dL — ABNORMAL LOW (ref 13.0–17.0)
Hemoglobin: 12.6 g/dL — ABNORMAL LOW (ref 13.0–17.0)
O2 Saturation: 70 %
O2 Saturation: 83 %
O2 Saturation: 82 %
O2 Saturation: 71 %
O2 Saturation: 85 %
Potassium: 4.5 mmol/L (ref 3.5–5.1)
Potassium: 4.6 mmol/L (ref 3.5–5.1)
Potassium: 4.6 mmol/L (ref 3.5–5.1)
Potassium: 4.6 mmol/L (ref 3.5–5.1)
Potassium: 4.6 mmol/L (ref 3.5–5.1)
Sodium: 138 mmol/L (ref 135–145)
Sodium: 138 mmol/L (ref 135–145)
Sodium: 137 mmol/L (ref 135–145)
Sodium: 137 mmol/L (ref 135–145)
Sodium: 138 mmol/L (ref 135–145)
TCO2: 29 mmol/L (ref 22–32)
TCO2: 31 mmol/L (ref 22–32)
TCO2: 30 mmol/L (ref 22–32)
TCO2: 29 mmol/L (ref 22–32)
TCO2: 30 mmol/L (ref 22–32)
pCO2, Ven: 44.6 mmHg (ref 44.0–60.0)
pCO2, Ven: 47.4 mmHg (ref 44.0–60.0)
pCO2, Ven: 44.9 mmHg (ref 44.0–60.0)
pCO2, Ven: 42 mmHg — ABNORMAL LOW (ref 44.0–60.0)
pCO2, Ven: 47.9 mmHg (ref 44.0–60.0)
pH, Ven: 7.397 (ref 7.250–7.430)
pH, Ven: 7.405 (ref 7.250–7.430)
pH, Ven: 7.408 (ref 7.250–7.430)
pH, Ven: 7.386 (ref 7.250–7.430)
pH, Ven: 7.424 (ref 7.250–7.430)
pO2, Ven: 37 mmHg (ref 32.0–45.0)
pO2, Ven: 48 mmHg — ABNORMAL HIGH (ref 32.0–45.0)
pO2, Ven: 47 mmHg — ABNORMAL HIGH (ref 32.0–45.0)
pO2, Ven: 38 mmHg (ref 32.0–45.0)
pO2, Ven: 49 mmHg — ABNORMAL HIGH (ref 32.0–45.0)

## 2021-05-13 LAB — GLUCOSE, CAPILLARY: Glucose-Capillary: 319 mg/dL — ABNORMAL HIGH (ref 70–99)

## 2021-05-13 SURGERY — RIGHT HEART CATH
Anesthesia: LOCAL

## 2021-05-13 MED ORDER — SODIUM CHLORIDE 0.9 % IV SOLN: 250.0000 mL | INTRAVENOUS | Status: DC | PRN

## 2021-05-13 MED ORDER — HEPARIN (PORCINE) IN NACL 1000-0.9 UT/500ML-% IV SOLN
INTRAVENOUS | Status: DC | PRN
  Administered 2021-05-13: 500 mL

## 2021-05-13 MED ORDER — SODIUM CHLORIDE 0.9% FLUSH: 3.0000 mL | INTRAVENOUS | Status: DC | PRN

## 2021-05-13 MED ORDER — ACETAMINOPHEN 325 MG PO TABS: 650.0000 mg | ORAL_TABLET | ORAL | Status: DC | PRN

## 2021-05-13 MED ORDER — LIDOCAINE HCL (PF) 1 % IJ SOLN
INTRAMUSCULAR | Status: AC
  Filled 2021-05-13: qty 30

## 2021-05-13 MED ORDER — ONDANSETRON HCL 4 MG/2ML IJ SOLN: 4.0000 mg | Freq: Four times a day (QID) | INTRAMUSCULAR | Status: DC | PRN

## 2021-05-13 MED ORDER — LABETALOL HCL 5 MG/ML IV SOLN: 10.0000 mg | INTRAVENOUS | Status: DC | PRN

## 2021-05-13 MED ORDER — HEPARIN (PORCINE) IN NACL 1000-0.9 UT/500ML-% IV SOLN
INTRAVENOUS | Status: AC
  Filled 2021-05-13: qty 500

## 2021-05-13 MED ORDER — SODIUM CHLORIDE 0.9 % IV SOLN: INTRAVENOUS | Status: DC

## 2021-05-13 MED ORDER — SODIUM CHLORIDE 0.9% FLUSH: 3.0000 mL | Freq: Two times a day (BID) | INTRAVENOUS | Status: DC

## 2021-05-13 MED ORDER — LIDOCAINE HCL (PF) 1 % IJ SOLN
INTRAMUSCULAR | Status: DC | PRN
  Administered 2021-05-13: 2 mL

## 2021-05-13 MED ORDER — HYDRALAZINE HCL 20 MG/ML IJ SOLN: 10.0000 mg | INTRAMUSCULAR | Status: DC | PRN

## 2021-05-13 SURGICAL SUPPLY — 6 items
CATH BALLN WEDGE 5F 110CM (CATHETERS) ×1 IMPLANT
GUIDEWIRE .025 260CM (WIRE) ×1 IMPLANT
KIT HEART LEFT (KITS) ×2 IMPLANT
PACK CARDIAC CATHETERIZATION (CUSTOM PROCEDURE TRAY) ×2 IMPLANT
SHEATH GLIDE SLENDER 4/5FR (SHEATH) ×1 IMPLANT
TRANSDUCER W/STOPCOCK (MISCELLANEOUS) ×2 IMPLANT

## 2021-05-13 NOTE — Interval H&P Note (Signed)
History and Physical Interval Note:  05/13/2021 11:06 AM  [name]  has presented today for surgery, with the diagnosis of chf.  The various methods of treatment have been discussed with the patient and family. After consideration of risks, benefits and other options for treatment, the patient has consented to  Procedure(s): RIGHT HEART CATH (N/A) as a surgical intervention.  The patient's history has been reviewed, patient examined, no change in status, stable for surgery.  I have reviewed the patient's chart and labs.  Questions were answered to the patient's satisfaction.     Karlyn Glasco [employer]

## 2021-05-13 NOTE — Discharge Instructions (Signed)
Brachial Site Care   This sheet gives you information about how to care for yourself after your procedure. Your health care provider may also give you more specific instructions. If you have problems or questions, contact your health care provider. What can I expect after the procedure? After the procedure, it is common to have: Bruising and tenderness at the catheter insertion area. Follow these instructions at home: Medicines Take over-the-counter and prescription medicines only as told by your health care provider. Insertion site care Follow instructions from your health care provider about how to take care of your insertion site. Make sure you: Wash your hands with soap and water before you change your bandage (dressing). If soap and water are not available, use hand sanitizer. Remove your dressing as told by your health care provider. In 24 hours Check your insertion site every day for signs of infection. Check for: Redness, swelling, or pain. Fluid or blood. Pus or a bad smell. Warmth. Do not take baths, swim, or use a hot tub until your health care provider approves. You may shower 24-48 hours after the procedure, or as directed by your health care provider. Remove the dressing and gently wash the site with plain soap and water. Pat the area dry with a clean towel. Do not rub the site. That could cause bleeding. Do not apply powder or lotion to the site. Activity   For 24 hours after the procedure, or as directed by your health care provider: Do not flex or bend the affected arm. Do not push or pull heavy objects with the affected arm. Do not drive yourself home from the hospital or clinic. You may drive 24 hours after the procedure unless your health care provider tells you not to. Do not operate machinery or power tools. Do not lift anything that is heavier than 10 lb (4.5 kg), or the limit that you are told, until your health care provider says that it is safe.  For 2  days Ask your health care provider when it is okay to: Return to work or school. Resume usual physical activities or sports. Resume sexual activity. General instructions If the catheter site starts to bleed, raise your arm and put firm pressure on the site. If the bleeding does not stop, get help right away. This is a medical emergency. If you went home on the same day as your procedure, a responsible adult should be with you for the first 24 hours after you arrive home. Keep all follow-up visits as told by your health care provider. This is important. Contact a health care provider if: You have a fever. You have redness, swelling, or yellow drainage around your insertion site. Get help right away if: The insertion area is bleeding, and the bleeding does not stop when you hold steady pressure on the area. These symptoms may represent a serious problem that is an emergency. Do not wait to see if the symptoms will go away. Get medical help right away. Call your local emergency services (911 in the [place] Do not drive yourself to the hospital. Summary After the procedure, it is common to have bruising and tenderness at the site. Follow instructions from your health care provider about how to take care of your radial site wound. Check the wound every day for signs of infection. Do not lift anything that is heavier than 10 lb (4.5 kg), or the limit that you are told, until your health care provider says that it is safe. This information  is not intended to replace advice given to you by your health care provider. Make sure you discuss any questions you have with your health care provider. Document Revised: 05/09/2017 Document Reviewed: 05/09/2017 Elsevier Patient Education  2020 [employer].

## 2021-05-16 ENCOUNTER — Encounter (HOSPITAL_COMMUNITY): Payer: Self-pay | Admitting: Cardiology

## 2021-05-19 ENCOUNTER — Telehealth (HOSPITAL_COMMUNITY): Payer: Self-pay

## 2021-05-19 ENCOUNTER — Other Ambulatory Visit: Payer: Self-pay

## 2021-05-19 NOTE — Telephone Encounter (Signed)
Referral faxed to [facility] 

## 2021-05-23 ENCOUNTER — Telehealth: Payer: Self-pay | Admitting: Licensed Clinical Social Worker

## 2021-05-23 NOTE — Telephone Encounter (Signed)
No answer on home phone [phone]. Left message requesting call back to go over pt assistance applications. Will add reminder to upcoming appt w/ [facility] clinic that pt needs to speak with a Child psychotherapist.   Octavio Graves, MSW, LCSW Clinical Social Worker II [facility] Navigation  [phone]- work cell phone (preferred) [phone]- desk phone

## 2021-05-26 ENCOUNTER — Other Ambulatory Visit: Payer: Self-pay

## 2021-06-01 ENCOUNTER — Other Ambulatory Visit: Payer: Self-pay

## 2021-06-01 ENCOUNTER — Encounter (INDEPENDENT_AMBULATORY_CARE_PROVIDER_SITE_OTHER): Payer: Self-pay | Admitting: Primary Care

## 2021-06-01 ENCOUNTER — Ambulatory Visit (INDEPENDENT_AMBULATORY_CARE_PROVIDER_SITE_OTHER): Payer: Self-pay | Admitting: Primary Care

## 2021-06-01 ENCOUNTER — Telehealth: Payer: Self-pay | Admitting: Licensed Clinical Social Worker

## 2021-06-01 VITALS — BP 133/88 | HR 86 | Temp 97.5°F | Wt 134.8 lb

## 2021-06-01 DIAGNOSIS — E1069 Type 1 diabetes mellitus with other specified complication: Secondary | ICD-10-CM

## 2021-06-01 DIAGNOSIS — F4323 Adjustment disorder with mixed anxiety and depressed mood: Secondary | ICD-10-CM

## 2021-06-01 DIAGNOSIS — Z760 Encounter for issue of repeat prescription: Secondary | ICD-10-CM

## 2021-06-01 LAB — POCT GLYCOSYLATED HEMOGLOBIN (HGB A1C): HbA1c, POC (controlled diabetic range): 8.8 % — AB (ref 0.0–7.0)

## 2021-06-01 MED ORDER — INSULIN GLARGINE 100 UNIT/ML SOLOSTAR PEN
25.0000 [IU] | PEN_INJECTOR | Freq: Every day | SUBCUTANEOUS | 11 refills | Status: DC
  Filled 2021-06-01: qty 9, 36d supply, fill #0
  Filled 2021-06-01: qty 15, 60d supply, fill #0

## 2021-06-01 MED ORDER — INSULIN ASPART 100 UNIT/ML CARTRIDGE (PENFILL)
12.0000 [IU] | Freq: Three times a day (TID) | SUBCUTANEOUS | 11 refills | Status: DC
  Filled 2021-06-01: qty 15, 42d supply, fill #0

## 2021-06-01 NOTE — Telephone Encounter (Signed)
Incoming message from Meredith Pel, w/ [employer]. Pt has attended his PCP appt at [facility] today but needs further assistance w/ Entresto. I have sent Erskine Squibb the application, requested pt begin filling it out and bring it to appt w/ A[facility] clinic tomorrow. I have added f/u that pt needs to see LCSW and Pharmacy tomorrow during appt. Currently uninsured (previous insurance lapsed), not working and w/ ongoing health needs. Erskine Squibb will also have staff confirm pt telephone numbers as he had never called this writer back regarding assistance applications previously mailed. I have also alerted Jenna, LCSW, of the above.   Octavio Graves, MSW, LCSW Clinical Social Worker II [facility] Navigation  [phone]- work cell phone (preferred) [phone]- desk phone

## 2021-06-01 NOTE — Patient Instructions (Signed)

## 2021-06-01 NOTE — Progress Notes (Signed)
[place], is a 46 y.o. male  YIF:[id number]  OMV:[id number]  DOB - 1976-03-04  Chief Complaint  Patient presents with   Diabetes       Subjective:   [name] is a 46 y.o. male here today for a follow up on diabetes type 1. He has been depression unable to support self and family- voice thoughts of suicidal and homicidal thoughts.  Unable to handle situation and ask could I call my pastor to talk to him and pray over him . Patient agreed. Patient has No headache, No chest pain, No abdominal pain - No Nausea, No new weakness tingling or numbness, No Cough - SOB.  No problems updated.  ALLERGIES: Allergies  Allergen Reactions   Shellfish Allergy Anaphylaxis    PAST MEDICAL HISTORY: Past Medical History:  Diagnosis Date   CKD (chronic kidney disease), stage II    Diabetes mellitus without complication (HCC)    type 1   NICM (nonischemic cardiomyopathy) (HCC)    Coronary Ca score 0 and no CAD on coronary CTGA 04/2021   Polysubstance abuse ([place])     MEDICATIONS AT HOME: Prior to Admission medications   Medication Sig Start Date End Date Taking? Authorizing Provider  acetaminophen (TYLENOL) 325 MG tablet Take 650 mg by mouth every 6 (six) hours as needed for moderate pain.   Yes [provider]  aspirin EC 81 MG EC tablet Take 1 tablet (81 mg total) by mouth daily. Swallow whole. 12/08/20  Yes Eugenie Filler, MD  carvedilol (COREG) 6.25 MG tablet Take 1 tablet (6.25 mg total) by mouth 2 (two) times daily. 05/05/21 05/05/22 Yes Larey Dresser, MD  insulin aspart (NOVOLOG) cartridge Inject 12 Units into the skin 3 (three) times daily with meals. Blood sugars 0-150 give 0 units of insulin, 201-250 give 4 units, 251-300 give 6 units, 301-350 give 8 units, 351-400 give 10 units,> 400 give 12 units and call M.D. 06/01/21  Yes Kerin Perna, NP  insulin glargine (LANTUS) 100 UNIT/ML Solostar Pen Inject 25 Units into the  skin at bedtime. 06/01/21  Yes Kerin Perna, NP  Ipratropium-Albuterol (COMBIVENT) 20-100 MCG/ACT AERS respimat Inhale 1 puff by mouth into the lungs every 6 hours as needed for wheezing. 12/07/20  Yes Eugenie Filler, MD  sacubitril-valsartan (ENTRESTO) 24-26 MG Take 1 tablet by mouth 2 (two) times daily. 05/05/21  Yes Larey Dresser, MD  Aspirin-Acetaminophen-Caffeine (GOODY HEADACHE PO) Take 1 packet by mouth daily as needed (pain).    [provider]  Blood Glucose Monitoring Suppl (TRUE METRIX METER) w/Device KIT Use kit to check blood glucose up to four times daily as directed 11/23/20   Khatri, Hina, PA-C  fluticasone (FLONASE) 50 MCG/ACT nasal spray Place 2 sprays into both nostrils daily.    [provider]  glucose blood (TRUE METRIX BLOOD GLUCOSE TEST) test strip Use as instructed 11/23/20   Khatri, Hina, PA-C  Insulin Syringe-Needle U-100 (TRUEPLUS INSULIN SYRINGE) 31G X 5/16" 0.3 ML MISC [place] as directed as needed. 04/19/21   Kerin Perna, NP  TRUEplus Lancets 28G MISC Use as directed 11/23/20   Delia Heady, PA-C    Objective:   Vitals:   06/01/21 1005  BP: 133/88  Pulse: 86  Temp: (!) 97.5 F (36.4 C)  SpO2: 100%  Weight: 134 lb 12.8 oz (61.1 kg)   Exam General appearance : Awake, alert, not in any distress. Speech Clear. Not toxic looking HEENT:  Atraumatic and Normocephalic, pupils equally reactive to light and accomodation Neck: Supple, no JVD. No cervical lymphadenopathy.  Chest: Good air entry bilaterally, no added sounds  CVS: S1 S2 regular, no murmurs.  Abdomen: Bowel sounds present, Non tender and not distended with no gaurding, rigidity or rebound. Extremities: B/L Lower Ext shows no edema, both legs are warm to touch Neurology: Awake alert, and oriented X 3, CN II-XII intact, Non focal Skin: No Rash  Data Review Lab Results  Component Value Date   HGBA1C 8.8 (A) 06/01/2021   HGBA1C 8.4 (A) 03/01/2021   HGBA1C 12.4 (H) 11/23/2020     Assessment & Plan   1. Type 1 diabetes mellitus with other specified complication (HCC) Slight increase in A1C 8.8 .  Your A1C is a measure of your sugar over the past 3 months and is not affected by what you have eaten over the past few days. Diabetes increases your chances of stroke and heart attack over 300 % and is the leading cause of blindness and kidney failure in the [place]. Please make sure you decrease bad carbs like white bread, white rice, potatoes, corn, soft drinks, pasta, cereals, refined sugars, sweet tea, dried fruits, and fruit juice. Good carbs are okay to eat in moderation like sweet potatoes, brown rice, whole grain pasta/bread, most fruit (except dried fruit) and you can eat as many veggies as you want.   Greater than 6.5 is considered diabetic. Between 6.4 and 5.7 is prediabetic If your A1C is less than 5.7 you are NOT diabetic.  Targets for Glucose Readings: Time of Check Target for patients WITHOUT Diabetes Target for DIABETICS  Before Meals Less than 100  less than 150  Two hours after meals Less than 200  Less than 250    - HgB A1c 8.4  - insulin glargine (LANTUS) 100 UNIT/ML Solostar Pen; Inject 25 Units into the skin at bedtime.  Dispense: 15 mL; Refill: 11 - insulin aspart (NOVOLOG) cartridge; Inject 12 Units into the skin 3 (three) times daily with meals. Blood sugars 0-150 give 0 units of insulin, 201-250 give 4 units, 251-300 give 6 units, 301-350 give 8 units, 351-400 give 10 units,> 400 give 12 units and call M.D.  Dispense: 15 mL; Refill: 11  2. Medication refill - insulin glargine (LANTUS) 100 UNIT/ML Solostar Pen; Inject 25 Units into the skin at bedtime.  Dispense: 15 mL; Refill: 11 - insulin aspart (NOVOLOG) cartridge; Inject 12 Units into the skin 3 (three) times daily with meals. Blood sugars 0-150 give 0 units of insulin, 201-250 give 4 units, 251-300 give 6 units, 301-350 give 8 units, 351-400 give 10 units,> 400 give 12 units and call M.D.   Dispense: 15 mL; Refill: 11  3.Adjustment reaction with anxiety and depression See HPI (feels a lot better)  Patient have been counseled extensively about nutrition and exercise. Other issues discussed during this visit include: low cholesterol diet, weight control and daily exercise, foot care, annual eye examinations at Ophthalmology, importance of adherence with medications and regular follow-up. We also discussed long term complications of uncontrolled diabetes and hypertension.     The patient was given clear instructions to go to ER or return to medical center if symptoms don't improve, worsen or new problems develop. The patient verbalized understanding. The patient was told to call to get lab results if they haven't heard anything in the next week.   This note has been created with Surveyor, quantity.  Any transcriptional errors are unintentional.   Kerin Perna, NP 06/01/2021, 10:47 AM

## 2021-06-01 NOTE — Progress Notes (Signed)
Weak and fatigue DM Would like insulin pens for insulin Rx.

## 2021-06-02 ENCOUNTER — Encounter (HOSPITAL_COMMUNITY): Payer: Self-pay | Admitting: Cardiology

## 2021-06-02 ENCOUNTER — Other Ambulatory Visit: Payer: Self-pay

## 2021-06-02 ENCOUNTER — Ambulatory Visit (HOSPITAL_COMMUNITY)
Admission: RE | Admit: 2021-06-02 | Discharge: 2021-06-02 | Disposition: A | Discharge status: Home / Self Care | Payer: No Typology Code available for payment source | Source: Ambulatory Visit | Attending: Cardiology | Admitting: Cardiology

## 2021-06-02 VITALS — BP 120/78 | HR 85 | Wt 138.8 lb

## 2021-06-02 DIAGNOSIS — E109 Type 1 diabetes mellitus without complications: Secondary | ICD-10-CM | POA: Insufficient documentation

## 2021-06-02 DIAGNOSIS — I5022 Chronic systolic (congestive) heart failure: Secondary | ICD-10-CM

## 2021-06-02 DIAGNOSIS — F17210 Nicotine dependence, cigarettes, uncomplicated: Secondary | ICD-10-CM | POA: Insufficient documentation

## 2021-06-02 DIAGNOSIS — Z794 Long term (current) use of insulin: Secondary | ICD-10-CM | POA: Insufficient documentation

## 2021-06-02 DIAGNOSIS — Q248 Other specified congenital malformations of heart: Secondary | ICD-10-CM

## 2021-06-02 DIAGNOSIS — I729 Aneurysm of unspecified site: Secondary | ICD-10-CM | POA: Insufficient documentation

## 2021-06-02 DIAGNOSIS — Z79899 Other long term (current) drug therapy: Secondary | ICD-10-CM | POA: Insufficient documentation

## 2021-06-02 DIAGNOSIS — I110 Hypertensive heart disease with heart failure: Secondary | ICD-10-CM | POA: Insufficient documentation

## 2021-06-02 DIAGNOSIS — I428 Other cardiomyopathies: Secondary | ICD-10-CM | POA: Insufficient documentation

## 2021-06-02 LAB — BASIC METABOLIC PANEL
Anion gap: 8 (ref 5–15)
BUN: 13 mg/dL (ref 6–20)
CO2: 25 mmol/L (ref 22–32)
Calcium: 8.5 mg/dL — ABNORMAL LOW (ref 8.9–10.3)
Chloride: 103 mmol/L (ref 98–111)
Creatinine, Ser: 0.8 mg/dL (ref 0.61–1.24)
GFR, Estimated: >60 mL/min (ref >60)
Glucose, Bld: 176 mg/dL — ABNORMAL HIGH (ref 70–99)
Potassium: 4.4 mmol/L (ref 3.5–5.1)
Sodium: 136 mmol/L (ref 135–145)

## 2021-06-02 LAB — BRAIN NATRIURETIC PEPTIDE: B Natriuretic Peptide: 25.2 pg/mL (ref 0.0–100.0)

## 2021-06-02 MED ORDER — SPIRONOLACTONE 25 MG PO TABS
12.5000 mg | ORAL_TABLET | Freq: Every day | ORAL | 3 refills | Status: DC
Start: 2021-06-02 — End: 2021-08-09
  Filled 2021-06-02: qty 15, 30d supply, fill #0
  Filled 2021-07-01: qty 15, 30d supply, fill #1
  Filled 2021-08-09: qty 15, 30d supply, fill #2

## 2021-06-02 NOTE — Progress Notes (Signed)
PCP: Kerin Perna, NP Cardiology: Dr. Radford Pax HF Cardiology: Dr. Aundra Dubin  46 y.o. 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  Patient says that though he was very active when young (played football and baseball), he always felt like he tired out too easily. Over time, he has developed gradually worsening exertional dyspnea.  In 8/22, he was found unresponsive with opiates in his urine.  He says that he thought he was getting cocaine, but apparently someone gave him fentanyl.  He got Narcan and was briefly in atrial fibrillation.  No recurrent of AF since that time.  He was also found to have COVID-19 PNA during this admission. Echo in 8/22 showed EF 40-45%, apical aneurysm, septal hypokinesis. In 11/22, cardiac MRI was done.  This showed LV EF 40% with septal HK, small outpouching at LV apex consistent with congenital apical diverticulum (not pseudoaneurysm, no LGE to suggest aneurysm related to MI), septal mid-wall LGE, RV EF 55% with moderate RV dilation, dilated coronary sinus. Given concern for unroofed coronary sinus, coronary CTA was done in 1/23.  This showed no significant CAD, apical aneurysm, and unroofed coronary sinus with evidence for significant left to right shunt.   RHC was done in 1/23.  PA pressure was normal, there was evidence for left => right atrial level shunt with Qp/Qs 2.31.   Patient continues to report significant dyspnea, unchanged.  He is short of breath walking around his house, making up his bed.  He got quite short of breath walking in the office today.  No orthopnea/PND.  No chest pain.   Still smoking 1-2 cigarettes/day.   Labs (1/23): BNP 21, hgb 13.6, K 4.9, creatinine 1.06  PMH: 1. Type 1 diabetes: Since age 2 2. HTN 3. Depression 4. H/o COVID-19 PNA 5. Active smoker 6. Prior cocaine 7. Atrial fibrillation:  Paroxysmal, only noted brief run with respiratory arrest in 8/22.  8. Cardiomyopathy: Echo (8/22) with EF 40-45%, apical aneurysm, septal hypokinesis.  - Coronary CTA (1/23): Calcium score 0, no significant CAD, unroofed coronary since noted without persistent left SVC.  - Cardiac MRI (11/22): LV EF 40% with septal HK, small outpouching at LV apex consistent with congenital apical diverticulum (not pseudoaneurysm, no LGE to suggest aneurysm related to MI), septal mid-wall LGE, RV EF 55% with moderate RV dilation, dilated coronary sinus.  9. Congenital apical diverticulum: No LGE at the apex on 11/22 cMRI, so unlikely to be infarct-related aneurysm, and not consistent with pseudoaneurysm.  10. Unroofed coronary sinus: Confirmed by coronary CTA in 1/23. No associated persistent left SVC.  - RHC (1/23): mean RA 6, PA 24/10 mean 17, mean PCWP 9, CI 4.79, step up in oxygen saturation from SVC => RA with Qp/Qs 2.31.   SH: Married, used to work at [employer] but now out of work.  Smokes < 1 ppd. Uses marijuana.  Prior cocaine.   Family History  Problem Relation Age of Onset   Arthritis Mother    Diabetes Mellitus II Father    ROS: All systems reviewed and negative except as per HPI.   Current Outpatient Medications  Medication Sig Dispense Refill   acetaminophen (TYLENOL) 325 MG tablet Take 650 mg by mouth every 6 (six) hours as needed for moderate pain.     aspirin EC 81 MG EC tablet Take 1 tablet (81 mg total) by mouth daily. Swallow whole.  30 tablet 0   Aspirin-Acetaminophen-Caffeine (GOODY HEADACHE PO) Take 1 packet by mouth daily as needed (pain).     Blood Glucose Monitoring Suppl (TRUE METRIX METER) w/Device KIT Use kit to check blood glucose up to four times daily as directed 1 kit 0   carvedilol (COREG) 6.25 MG tablet Take 1 tablet (6.25 mg total) by mouth 2 (two) times daily. 60 tablet 11   fluticasone (FLONASE) 50 MCG/ACT nasal spray Place 2 sprays into both nostrils daily.      glucose blood (TRUE METRIX BLOOD GLUCOSE TEST) test strip Use as instructed 100 each 12   insulin aspart (NOVOLOG) cartridge Inject 12 Units into the skin 3 (three) times daily with meals. Blood sugars 0-150 give 0 units of insulin, 201-250 give 4 units, 251-300 give 6 units, 301-350 give 8 units, 351-400 give 10 units,> 400 give 12 units and call M.D. 15 mL 11   insulin glargine (LANTUS) 100 UNIT/ML Solostar Pen Inject 25 Units into the skin at bedtime. 15 mL 11   Insulin Syringe-Needle U-100 (TRUEPLUS INSULIN SYRINGE) 31G X 5/16" 0.3 ML MISC [place] as directed as needed. 100 each 0   Ipratropium-Albuterol (COMBIVENT) 20-100 MCG/ACT AERS respimat Inhale 1 puff by mouth into the lungs every 6 hours as needed for wheezing. 4 g 0   sacubitril-valsartan (ENTRESTO) 24-26 MG Take 1 tablet by mouth 2 (two) times daily. 60 tablet 11   spironolactone (ALDACTONE) 25 MG tablet Take 0.5 tablets (12.5 mg total) by mouth daily. 15 tablet 3   TRUEplus Lancets 28G MISC Use as directed 100 each 0   No current facility-administered medications for this encounter.   BP 120/78    Pulse 85    Wt 63 kg (138 lb 12.8 oz)    SpO2 100%    BMI 21.10 kg/m  General: NAD Neck: No JVD, no thyromegaly or thyroid nodule.  Lungs: Clear to auscultation bilaterally with normal respiratory effort. CV: Nondisplaced PMI.  Heart regular S1/S2, no S3/S4, no murmur.  No peripheral edema.  No carotid bruit.  Normal pedal pulses.  Abdomen: Soft, nontender, no hepatosplenomegaly, no distention.  Skin: Intact without lesions or rashes.  Neurologic: Alert and oriented x 3.  Psych: Normal affect. Extremities: No clubbing or cyanosis.  HEENT: Normal.   Assessment/Plan: 1. Unroofed coronary sinus: There was concern for this on 11/22 cMRI, it was confirmed by coronary CTA.  There appears to be significant left to right shunting on CT. There was not an associated persistent left SVC.  The RV had normal systolic function but was moderately  dilated on cMRI. RHC showed normal PA pressure and left => right shunt at atrial level with significant Qp/Qs 2.31.  I suspect that some of his symptomatology is related to his left to right shunting with CHF though filling pressures not high on RHC.  - With high Qp/Qs, I think that he will need surgical correction of the unroofed coronary sinus due to left to right shunting.  I will refer to the [facility] adult congenital heart program for evaluation.  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 No CAD on coronary CTA and no MI-pattern LGE at the apex.  It does not appear to be a pseudoaneurysm. Congenital LV apical diverticulum is often associated with other abnormalities (in this case, unroofed CS).   3. Chronic systolic CHF: Echo with EF 40-45% in 8/22, cMRI with LV EF 40% in 11/22.  There was septal mid-wall LGE.  No  CAD on coronary CTA.  Nonischemic cardiomyopathy, uncertain etiology.  Possible prior myocarditis versus sarcoidosis.  No evidence for pulmonary sarcoidosis on 1/23 coronary CTA. He is not volume overloaded on exam.  NYHA class III symptoms, likely related to L>R shunting primarily.  - Continue Coreg 6.25 mg bid.  - Continue Entresto 24/26 bid.   - Add spironolactone 12.5 mg daily.  BMET/BNP today and BMET in 10 days.  4. Type 1 diabetes: Per PCP.  5. Smoking: He still smokes 1-2 cigarettes/day.  I strongly encouraged him to quit.   Loralie Champagne 06/02/2021

## 2021-06-02 NOTE — Patient Instructions (Signed)
Start Spironolactone 12.5 mg (1/2 tab) Daily  Labs done today, we will call you for abnormal results  Your physician recommends that you return for lab work in: 10 days  You have been referred to [facility], we have updated your phone number  Your physician recommends that you schedule a follow-up appointment in: 2 months  If you have any questions or concerns before your next appointment please send [place] a message through [place] or call our office at [phone].    TO LEAVE A MESSAGE FOR THE NURSE SELECT OPTION 2, PLEASE LEAVE A MESSAGE INCLUDING: YOUR NAME DATE OF BIRTH CALL BACK NUMBER REASON FOR CALL**this is important as we prioritize the call backs  YOU WILL RECEIVE A CALL BACK THE SAME DAY AS LONG AS YOU CALL BEFORE 4:00 PM  At the [facility], you and your health needs are our priority. As part of our continuing mission to provide you with exceptional heart care, we have created designated Provider Care Teams. These Care Teams include your primary Cardiologist (physician) and Advanced Practice Providers (APPs- Physician Assistants and Nurse Practitioners) who all work together to provide you with the care you need, when you need it.   You may see any of the following providers on your designated Care Team at your next follow up: Dr Arvilla Meres Dr Carron Curie, NP Robbie Lis, [place] [facility], [place] Karle Plumber, PharmD   Please be sure to bring in all your medications bottles to every appointment.

## 2021-06-02 NOTE — Progress Notes (Addendum)
Heart and Vascular Care Navigation  06/02/2021  [name] [birth year] [id number]  Reason for Referral: Pt referred to speak with CSW regarding current lack of insurance   Engaged with patient face to face for initial visit for Heart and Vascular Care Coordination.                                                                                                   Assessment:   Pt has not had insurance since losing his job in the fall.  Is currently unable to work due to his condition-planning for upcoming surgery.  Has applied for Medicaid in the past but has been denied.  Has been mailed [employer]  multiple times but reports he is not sure he has it.  CSW provided with new application and reviewed to how to complete.  Also provided [employer] application to help connect with community options for healthcare.  Pt also discussed current mental health concerns.  Struggling with stressors related ot his health as well as to other family dynamics (lost a child, mother is becoming more dependent and stressful family dynamics regarding her care, etc.)  Reports he has appt with [facility] in the beginning of March.  CSW explained that [facility] will likely not be able to provide frequent visits due to volume of pt seen so suggested also connecting with [facility] or [facility] to see provider there as well for more regular counseling given his current struggles.         CSW also assisted pt in completing [employer] application to help get Entresto.                            HRT/VAS Care Coordination     Living arrangements for the past 2 months Single Family Home   Lives with: Spouse   Home Assistive Devices/Equipment Eyeglasses; CBG Meter       Social History:                                                                             SDOH Screenings   Alcohol Screen: Not on file  Depression (PHQ2-9): Medium Risk   PHQ-2 Score: 24  Financial Resource Strain: Not on file  Food  Insecurity: Not on file  Housing: Not on file  Physical Activity: Not on file  Social Connections: Not on file  Stress: Not on file  Tobacco Use: High Risk   Smoking Tobacco Use: Every Day   Smokeless Tobacco Use: Never   Passive Exposure: Not on file  Transportation Needs: Not on file    SDOH Interventions: Financial Resources:  Financial Strain Interventions: Other (Comment) Sports administrator) Financial Counseling for [employer] [facility]  Food Insecurity:   Not discussed  Housing Insecurity:  Not  discussed  Transportation:   Not discussed  Other Care Navigation Interventions:     Inpatient/Outpatient Substance Abuse Counseling/Rehab Options N/a  Provided Pharmacy assistance resources Helped fill out [employer] application for [facility] assistance  Patient expressed Mental Health concerns Yes, Referred to:  see below  Patient Referred to: [facility] [place] and [place]   Follow-up plan:     Pt to work on [employer] then come in to get copies and submit with my help.  Pt to follow up with [facility] and/or [facility] to additional mental health follow up.  Pt to bring in proof of income for [employer] assistance  Will continue to follow and assist as needed  Jorge Ny, [place] Worker [facility] Desk#: [phone] Cell#: [phone]

## 2021-06-03 ENCOUNTER — Other Ambulatory Visit: Payer: Self-pay | Admitting: Pharmacist

## 2021-06-03 ENCOUNTER — Other Ambulatory Visit: Payer: Self-pay

## 2021-06-03 ENCOUNTER — Other Ambulatory Visit: Payer: Self-pay | Admitting: Primary Care

## 2021-06-03 ENCOUNTER — Other Ambulatory Visit (INDEPENDENT_AMBULATORY_CARE_PROVIDER_SITE_OTHER): Payer: Self-pay

## 2021-06-03 MED ORDER — "INSULIN SYRINGE-NEEDLE U-100 31G X 5/16"" 0.3 ML MISC"
1.0000 | 0 refills | Status: DC | PRN
  Filled 2021-06-03: qty 100, 25d supply, fill #0

## 2021-06-03 MED ORDER — INSULIN LISPRO (1 UNIT DIAL) 100 UNIT/ML (KWIKPEN)
PEN_INJECTOR | SUBCUTANEOUS | 2 refills | Status: DC
  Filled 2021-06-03: qty 15, 28d supply, fill #0
  Filled 2021-07-01: qty 15, 41d supply, fill #1
  Filled 2021-09-07 – 2021-09-26 (×2): qty 15, 41d supply, fill #2

## 2021-06-03 NOTE — Telephone Encounter (Signed)
Sent to PCP ?

## 2021-06-03 NOTE — Addendum Note (Signed)
Encounter addended by: Noralee Space, RN on: 06/03/2021 3:35 PM  Actions taken: Clinical Note Signed

## 2021-06-03 NOTE — Progress Notes (Signed)
Ref form, demographics, OV notes, and cardiac test all faxed to Dr Deatra James at [facility] at [phone]

## 2021-06-07 ENCOUNTER — Other Ambulatory Visit: Payer: Self-pay

## 2021-06-07 ENCOUNTER — Other Ambulatory Visit (INDEPENDENT_AMBULATORY_CARE_PROVIDER_SITE_OTHER): Payer: Self-pay | Admitting: Primary Care

## 2021-06-07 MED ORDER — INSULIN PEN NEEDLE 31G X 8 MM MISC
1.0000 | Freq: Three times a day (TID) | 11 refills | Status: DC
  Filled 2021-06-07: qty 100, 33d supply, fill #0
  Filled 2021-09-07 – 2021-09-26 (×2): qty 100, 33d supply, fill #1
  Filled 2021-10-21: qty 100, 33d supply, fill #2
  Filled 2021-11-30: qty 100, 33d supply, fill #3
  Filled 2021-12-30: qty 100, 33d supply, fill #4
  Filled 2022-02-03: qty 100, 33d supply, fill #5
  Filled 2022-03-10: qty 100, 33d supply, fill #6
  Filled 2022-04-21: qty 100, 33d supply, fill #7
  Filled 2022-05-19 – 2022-05-26 (×3): qty 100, 33d supply, fill #8

## 2021-06-07 MED ORDER — "INSULIN SYRINGE-NEEDLE U-100 31G X 5/16"" 0.3 ML MISC"
1.0000 | 0 refills | Status: AC | PRN
  Filled 2021-06-07 – 2021-11-30 (×3): qty 100, 25d supply, fill #0

## 2021-06-09 ENCOUNTER — Ambulatory Visit: Payer: Self-pay | Attending: Primary Care | Admitting: Pharmacist

## 2021-06-09 ENCOUNTER — Other Ambulatory Visit: Payer: Self-pay

## 2021-06-09 DIAGNOSIS — E1069 Type 1 diabetes mellitus with other specified complication: Secondary | ICD-10-CM

## 2021-06-09 DIAGNOSIS — Z760 Encounter for issue of repeat prescription: Secondary | ICD-10-CM

## 2021-06-09 MED ORDER — INSULIN GLARGINE 100 UNIT/ML SOLOSTAR PEN
28.0000 [IU] | PEN_INJECTOR | Freq: Every day | SUBCUTANEOUS | 11 refills | Status: DC
  Filled 2021-06-09 – 2021-07-01 (×2): qty 15, 53d supply, fill #0

## 2021-06-09 NOTE — Progress Notes (Signed)
° ° °  S:    PCP: Michelle   Patient arrives in good spirits.  Presents for diabetes evaluation, education, and management. Patient was referred and last seen by Primary Care Provider on 06/01/2021. A1c at that visit was 8.8%.  Family/Social History:  -Fhx: DM -Tobacco: current 0.5 PPD smoker  -Alcohol: denies use currently   Insurance coverage/medication affordability: Dnbi   Medication adherence reported.   Current diabetes medications include: Lantus 25 units daily, Novolog per sliding scale   Patient denies hypoglycemic events. Becomes aware once CBGs at home drop to below 100. Never sees anything <70.   Patient reported dietary habits: -Snacks during the day -Will keep apple juice, candy bars with him in case his sugar "drops"   Patient-reported exercise habits:  - Walks ~30 minutes daily    Patient denies nocturia (nighttime urination).  Patient denies neuropathy (nerve pain). Patient denies visual changes. Patient reports self foot exams.     O:  Physical Exam  ROS  Lab Results  Component Value Date   HGBA1C 8.8 (A) 06/01/2021   There were no vitals filed for this visit.  Lipid Panel     Component Value Date/Time   CHOL 101 12/06/2020 0330   CHOL 191 01/28/2018 1205   TRIG 34 12/06/2020 0330   HDL 62 12/06/2020 0330   HDL 107 01/28/2018 1205   CHOLHDL 1.6 12/06/2020 0330   VLDL 7 12/06/2020 0330   LDLCALC 32 12/06/2020 0330   LDLCALC 69 01/28/2018 1205   Home fasting blood sugars: reports high 100s-200s. No meter with him today.   2 hour post-meal/random blood sugars: reports 200s. Rare 300.  Clinical Atherosclerotic Cardiovascular Disease (ASCVD): No  The ASCVD Risk score (Arnett DK, et al., 2019) failed to calculate for the following reasons:   The valid total cholesterol range is 130 to 320 mg/dL    A/P: Diabetes longstanding currently uncontrolled. Patient is able to verbalize appropriate hypoglycemia management plan. Medication adherence  appears okay. -Increased dose of Lantus to 28 units daily. Advised pt he may increase to 30 units after 1 week if fasting CBGs >130. -Continue sliding scale.  -Advised patient to eat regular meals instead of snacking  -Extensively discussed pathophysiology of diabetes, recommended lifestyle interventions, dietary effects on blood sugar control -Counseled on s/sx of and management of hypoglycemia -Next A1C anticipated 05/2021.   Written patient instructions provided. Total time in face to face counseling 15 minutes.   Follow up PCP Clinic Visit March.   Butch Penny, PharmD, [place], CPP Clinical Pharmacist [facility] [phone]

## 2021-06-10 ENCOUNTER — Other Ambulatory Visit: Payer: Self-pay

## 2021-06-13 ENCOUNTER — Other Ambulatory Visit: Payer: Self-pay

## 2021-06-13 ENCOUNTER — Ambulatory Visit (HOSPITAL_COMMUNITY)
Admission: RE | Admit: 2021-06-13 | Discharge: 2021-06-13 | Disposition: A | Discharge status: Home / Self Care | Payer: Self-pay | Source: Ambulatory Visit | Attending: Cardiology | Admitting: Cardiology

## 2021-06-13 DIAGNOSIS — I5022 Chronic systolic (congestive) heart failure: Secondary | ICD-10-CM | POA: Insufficient documentation

## 2021-06-13 LAB — BASIC METABOLIC PANEL
Anion gap: 6 (ref 5–15)
BUN: 16 mg/dL (ref 6–20)
CO2: 28 mmol/L (ref 22–32)
Calcium: 8.6 mg/dL — ABNORMAL LOW (ref 8.9–10.3)
Chloride: 104 mmol/L (ref 98–111)
Creatinine, Ser: 0.96 mg/dL (ref 0.61–1.24)
GFR, Estimated: >60 mL/min (ref >60)
Glucose, Bld: 261 mg/dL — ABNORMAL HIGH (ref 70–99)
Potassium: 4.3 mmol/L (ref 3.5–5.1)
Sodium: 138 mmol/L (ref 135–145)

## 2021-06-17 ENCOUNTER — Other Ambulatory Visit: Payer: Self-pay

## 2021-06-20 ENCOUNTER — Ambulatory Visit (INDEPENDENT_AMBULATORY_CARE_PROVIDER_SITE_OTHER): Payer: No Payment, Other | Admitting: Licensed Clinical Social Worker

## 2021-06-20 ENCOUNTER — Other Ambulatory Visit: Payer: Self-pay

## 2021-06-20 DIAGNOSIS — F332 Major depressive disorder, recurrent severe without psychotic features: Secondary | ICD-10-CM

## 2021-06-20 DIAGNOSIS — F1910 Other psychoactive substance abuse, uncomplicated: Secondary | ICD-10-CM

## 2021-06-21 ENCOUNTER — Other Ambulatory Visit (HOSPITAL_COMMUNITY): Payer: Self-pay

## 2021-06-21 ENCOUNTER — Telehealth (HOSPITAL_COMMUNITY): Payer: Self-pay | Admitting: Pharmacy Technician

## 2021-06-21 MED ORDER — ENTRESTO 24-26 MG PO TABS: 1.0000 | ORAL_TABLET | Freq: Two times a day (BID) | ORAL | 3 refills | Status: DC

## 2021-06-21 NOTE — Telephone Encounter (Signed)
[facility] Patient Advocate Encounter ? ?Sent in [employer] application via fax. No POI was attached, will initially be denied.  ? ?Document scanned to chart. ? ?Will follow up. ? ?

## 2021-06-27 ENCOUNTER — Telehealth (HOSPITAL_COMMUNITY): Payer: Self-pay | Admitting: Licensed Clinical Social Worker
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
## 2021-06-27 NOTE — Telephone Encounter (Signed)
LCSW phoned pt re referrals for substance abuse tx. LCSW advised [facility] would not accept referral as not equipped to care for heart condition and substances limited to cannabis and nicotine. LCSW advised pt he can present voluntarily to [facility] and/or [facility] seeking inpatient care to see if he will be admitted. He is referred to Bri at [facility] for intensive outpatient tx. Pt writes all info down. He is aware he will be followed by another counselor at [facility] going forward. [name] states appreciation for care. ?

## 2021-06-30 ENCOUNTER — Encounter: Payer: Self-pay | Admitting: Internal Medicine

## 2021-06-30 ENCOUNTER — Other Ambulatory Visit: Payer: Self-pay

## 2021-06-30 ENCOUNTER — Ambulatory Visit (INDEPENDENT_AMBULATORY_CARE_PROVIDER_SITE_OTHER): Payer: No Typology Code available for payment source | Admitting: Internal Medicine

## 2021-06-30 VITALS — BP 120/76 | HR 82 | Ht 67.5 in | Wt 135.2 lb

## 2021-06-30 DIAGNOSIS — Q248 Other specified congenital malformations of heart: Secondary | ICD-10-CM

## 2021-06-30 DIAGNOSIS — I5022 Chronic systolic (congestive) heart failure: Secondary | ICD-10-CM

## 2021-06-30 NOTE — Progress Notes (Signed)
Error in scheduling

## 2021-07-01 ENCOUNTER — Other Ambulatory Visit: Payer: Self-pay | Admitting: Pharmacist

## 2021-07-01 ENCOUNTER — Other Ambulatory Visit: Payer: Self-pay

## 2021-07-01 MED ORDER — BASAGLAR KWIKPEN 100 UNIT/ML ~~LOC~~ SOPN
28.0000 [IU] | PEN_INJECTOR | Freq: Every day | SUBCUTANEOUS | 3 refills | Status: DC
  Filled 2021-07-01: qty 9, 32d supply, fill #0
  Filled 2021-08-09: qty 9, 32d supply, fill #1

## 2021-07-06 NOTE — Telephone Encounter (Signed)
[employer] would like for the patient to submit proof of income in order to be approved. Called and left the patient a message concerning POI for next steps in application process. ? ?Archer Asa, CPhT ? ?

## 2021-07-13 ENCOUNTER — Ambulatory Visit (INDEPENDENT_AMBULATORY_CARE_PROVIDER_SITE_OTHER): Payer: No Typology Code available for payment source | Admitting: Primary Care

## 2021-08-03 ENCOUNTER — Ambulatory Visit (INDEPENDENT_AMBULATORY_CARE_PROVIDER_SITE_OTHER): Payer: Self-pay | Admitting: Primary Care

## 2021-08-03 ENCOUNTER — Encounter (INDEPENDENT_AMBULATORY_CARE_PROVIDER_SITE_OTHER): Payer: Self-pay | Admitting: Primary Care

## 2021-08-03 VITALS — BP 128/87 | HR 87 | Resp 16 | Wt 133.0 lb

## 2021-08-03 DIAGNOSIS — F4323 Adjustment disorder with mixed anxiety and depressed mood: Secondary | ICD-10-CM

## 2021-08-03 DIAGNOSIS — E1069 Type 1 diabetes mellitus with other specified complication: Secondary | ICD-10-CM

## 2021-08-03 DIAGNOSIS — Z720 Tobacco use: Secondary | ICD-10-CM

## 2021-08-03 NOTE — Patient Instructions (Signed)
Type 1 Diabetes Mellitus, Diagnosis, Adult  Type 1 diabetes (type 1 diabetes mellitus) is a long-term disease. It happens when the pancreas does not make enough of a hormone called insulin. This hormone lets sugars (glucose) get into cells in the body. The sugars give the body energy. If the body does not make enough insulin, sugars cannot get into cells. This causes high blood sugar (hyperglycemia). There is no cure for this disease right now, but you can treat it with insulin and lifestyle changes. What are the causes? The exact cause of type 1 diabetes is not known. What increases the risk? You may be more likely to develop this condition if: Someone in your family has type 1 diabetes. Your body's disease-fighting system (immune system) attacks itself. You are around some kinds of germs. There is cold weather where you live. What are the signs or symptoms? You may get symptoms slowly over days or weeks, or you may get them all of a sudden. Symptoms may include: More thirst or hunger than normal. Peeing more often than normal. Peeing more often at night. Sudden weight change. Weight change that you cannot explain. Feeling tired or weak. Seeing things blurry. How is this treated? This condition may be treated by a diabetes expert. You can help care for your condition by following instructions from your doctor. You may need to: Take insulin daily. Take medicines to help you avoid health problems that may be caused by your condition. Check your blood sugar as often as told. Change your diet. You may need an eating plan made just for you by a food expert (dietitian). Get regular exercise. Find ways to lower stress. Your doctor will set treatment goals for you. These will be based on your age, other health problems you have, and how much your diabetes treatment helps you. You should try to keep your blood sugar at these levels: Before meals: 80-130 mg/dL (4.4-7.2 mmol/L). After meals:  below 180 mg/dL (10 mmol/L). A1C (hemoglobin A1C)level: less than 7%. Follow these instructions at home: Questions to ask your doctor Do I need to meet with a diabetes educator? Should I join a support group for people with diabetes? What equipment will I need to care for myself at home? What medicines do I need? When should I take them? How often do I need to check my blood sugar? What number can I call if I have questions? When is my next doctor visit? General instructions Take over-the-counter and prescription medicines only as told by your doctor. Keep all follow-up visits as told by your doctor. This is important. Where to find more information American Diabetes Association (ADA): diabetes.org Association of Diabetes Care and Education Specialists (ADCES): diabeteseducator.org International Diabetes Federation (IDF): idf.org Contact a doctor if: Your blood sugar is higher than 240 mg/dL (13.3 mmol/L) for 2 days in a row. You have been sick for 2 days or more, and you are not getting better. You have had a fever for 2 days or more, and you are not getting better. You have any of these problems for more than 6 hours: You cannot eat or drink. You feel like you may vomit. You vomit. You have watery poop. Get help right away if: Your blood sugar is below 54 mg/dL (3 mmol/L). You get mixed up (confused). You cannot think clearly. You have trouble breathing. These symptoms may be an emergency. Do not wait to see if the symptoms will go away. Get medical help right away. Call your local   emergency services (911 in the [place] Do not drive yourself to the hospital. Summary Type 1 diabetes is a long-term disease. It happens when your pancreas does not make enough of a hormone called insulin. The exact cause of this condition is not known. There is no cure for it right now. This condition may be treatedwith insulin and other medicines. You may also treat it with diet or lifestyle  changes. Your doctor will set treatment goals for you. These will be based on your age, other health problems, and how much your treatment helps you. This information is not intended to replace advice given to you by your health care provider. Make sure you discuss any questions you have with your health care provider. Document Revised: 05/22/2019 Document Reviewed: 05/22/2019 Elsevier Patient Education  2023 Elsevier Inc.  

## 2021-08-03 NOTE — Progress Notes (Signed)
F/u depression and anxiety ?

## 2021-08-03 NOTE — Progress Notes (Signed)
?[place] ? ?[name], is a 46 y.o. male ? ?[id number] ? ?[id number] ? ?DOB - Jul 15, 1975 ? ?Chief Complaint  ?Patient presents with  ? Follow-up  ?  Depression ?  ?    ? ?Subjective:  ? ?[name] is a 46 y.o. male here today for a follow up visit for anxiety and depression. PHQ score is worst but today he admits to no homicidal or suicidal ideations. He does admit that [place] he at a funeral aunt passed he felt homicidal.  Patient has No headache, No chest pain, No abdominal pain - No Nausea, No new weakness tingling or numbness, No Cough - shortness of breath ? ?No problems updated. ? ?Allergies  ?Allergen Reactions  ? Shellfish Allergy Anaphylaxis  ? ? ?Past Medical History:  ?Diagnosis Date  ? CKD (chronic kidney disease), stage II   ? Diabetes mellitus without complication ([place])   ? type 1  ? NICM (nonischemic cardiomyopathy) ([place])   ? Coronary Ca score 0 and no CAD on coronary CTGA 04/2021  ? Polysubstance abuse ([place])   ? ? ?Current Outpatient Medications on File Prior to Visit  ?Medication Sig Dispense Refill  ? acetaminophen (TYLENOL) 325 MG tablet Take 650 mg by mouth every 6 (six) hours as needed for moderate pain.    ? carvedilol (COREG) 6.25 MG tablet Take 1 tablet (6.25 mg total) by mouth 2 (two) times daily. 60 tablet 11  ? Insulin Glargine (BASAGLAR KWIKPEN) 100 UNIT/ML Inject 28 Units into the skin daily. 9 mL 3  ? insulin lispro (HUMALOG KWIKPEN) 100 UNIT/ML KwikPen Inject 0-12 Units into the skin 3 (three) times daily with meals. Blood sugars 0-150 give 0 units of insulin, 201-250 give 4 units, 251-300 give 6 units, 301-350 give 8 units, 351-400 give 10 units,> 400 give 12 units and call M.D. 15 mL 2  ? spironolactone (ALDACTONE) 25 MG tablet Take 0.5 tablets (12.5 mg total) by mouth daily. 15 tablet 3  ? aspirin EC 81 MG EC tablet Take 1 tablet (81 mg total) by mouth daily. Swallow whole. 30 tablet 0  ? Aspirin-Acetaminophen-Caffeine (GOODY HEADACHE PO) Take  1 packet by mouth daily as needed (pain).    ? Blood Glucose Monitoring Suppl (TRUE METRIX METER) w/Device KIT Use kit to check blood glucose up to four times daily as directed 1 kit 0  ? fluticasone (FLONASE) 50 MCG/ACT nasal spray Place 2 sprays into both nostrils daily. (Patient not taking: Reported on 08/03/2021)    ? glucose blood (TRUE METRIX BLOOD GLUCOSE TEST) test strip Use as instructed 100 each 12  ? Insulin Pen Needle 31G X 8 MM MISC Use as directed 3 (three) times daily before meals. 100 each 11  ? Insulin Syringe-Needle U-100 (TRUEPLUS INSULIN SYRINGE) 31G X 5/16" 0.3 ML MISC [place] as directed as needed. 100 each 0  ? Ipratropium-Albuterol (COMBIVENT) 20-100 MCG/ACT AERS respimat Inhale 1 puff by mouth into the lungs every 6 hours as needed for wheezing. 4 g 0  ? sacubitril-valsartan (ENTRESTO) 24-26 MG Take 1 tablet by mouth 2 (two) times daily. (Patient not taking: Reported on 06/30/2021) 180 tablet 3  ? TRUEplus Lancets 28G MISC Use as directed 100 each 0  ? ?No current facility-administered medications on file prior to visit.  ? ? ?Objective:  ? ?Vitals:  ? 08/03/21 1549  ?BP: 128/87  ?Pulse: 87  ?Resp: 16  ?SpO2: 99%  ?Weight: 133 lb (60.3 kg)  ? ? ?Exam ?General appearance : Awake, alert,  not in any distress. Speech Clear. Not toxic looking ?HEENT: Atraumatic and Normocephalic, pupils equally reactive to light and accomodation ?Neck: Supple, no JVD. No cervical lymphadenopathy.  ?Chest: Good air entry bilaterally, no added sounds  ?CVS: S1 S2 regular, no murmurs.  ?Abdomen: Bowel sounds present, Non tender and not distended with no gaurding, rigidity or rebound. ?Extremities: B/L Lower Ext shows no edema, both legs are warm to touch ?Neurology: Awake alert, and oriented X 3,  Non focal ?Skin: No Rash ? ?Data Review ?Lab Results  ?Component Value Date  ? HGBA1C 8.8 (A) 06/01/2021  ? HGBA1C 8.4 (A) 03/01/2021  ? HGBA1C 12.4 (H) 11/23/2020  ? ? ?Assessment & Plan  ?Isaac was seen today for  follow-up. ? ?Diagnoses and all orders for this visit: ? ?Adjustment reaction with anxiety and depression ?Followed by behavioral health.  ?  ?Type 1 diabetes mellitus with other specified complication ([place]) ?A1C 8.8 repeat in 1 month. Continue to monitor foods that are high in carbohydrates are the following rice, potatoes, breads, sugars, and pastas.  Reduction in the intake (eating) will assist in lowering your blood sugars. Take insulin as prescribe.  ? ?Tobacco abuse ?Nicotine affect every organ in the body second leading cause of death.  Increased risk for lung cancer and other respiratory diseases recommend cessation.  This will be reminded at each clinical visit. ?  ? ?Patient have been counseled extensively about nutrition and exercise. Other issues discussed during this visit include: low cholesterol diet, weight control and daily exercise, foot care, annual eye examinations at Ophthalmology, importance of adherence with medications and regular follow-up. We also discussed long term complications of uncontrolled diabetes and hypertension.  ? ?Return in about 5 weeks (around 09/07/2021) for DM f/u. ? ?The patient was given clear instructions to go to ER or return to medical center if symptoms don't improve, worsen or new problems develop. The patient verbalized understanding. The patient was told to call to get lab results if they haven't heard anything in the next week.  ? ?This note has been created with Surveyor, quantity. Any transcriptional errors are unintentional.  ? ?Kerin Perna, NP ?08/03/2021, 4:17 PM ? ?

## 2021-08-04 ENCOUNTER — Encounter (HOSPITAL_COMMUNITY): Payer: Self-pay

## 2021-08-04 ENCOUNTER — Other Ambulatory Visit: Payer: Self-pay

## 2021-08-05 ENCOUNTER — Telehealth (HOSPITAL_COMMUNITY): Payer: Self-pay | Admitting: Licensed Clinical Social Worker

## 2021-08-05 NOTE — Telephone Encounter (Signed)
CSW informed by PCP office that pt has been out of Entresto for 2 months.  [employer] patient assistance application had been submitted but pt needs to call [employer] to request no income attestation form for him to submit since he is currently not working. ? ?CSW called pt and informed of above ? ?Provided 2 bottles of samples to hold pt over until he can finish [employer] enrollment ? ?Samples ?LOT: [id number] ?EXP: APR 2025 ? ?Burna Sis, LCSW ?Clinical Social Worker ?[facility] ?Desk#: [phone] ?Cell#: [phone] ? ?

## 2021-08-09 ENCOUNTER — Ambulatory Visit (HOSPITAL_COMMUNITY)
Admission: RE | Admit: 2021-08-09 | Discharge: 2021-08-09 | Disposition: A | Discharge status: Home / Self Care | Payer: Self-pay | Source: Ambulatory Visit | Attending: Cardiology | Admitting: Cardiology

## 2021-08-09 ENCOUNTER — Other Ambulatory Visit (INDEPENDENT_AMBULATORY_CARE_PROVIDER_SITE_OTHER): Payer: Self-pay | Admitting: Physician Assistant

## 2021-08-09 ENCOUNTER — Other Ambulatory Visit: Payer: Self-pay

## 2021-08-09 VITALS — BP 138/72 | HR 87 | Wt 142.6 lb

## 2021-08-09 DIAGNOSIS — E109 Type 1 diabetes mellitus without complications: Secondary | ICD-10-CM | POA: Insufficient documentation

## 2021-08-09 DIAGNOSIS — I428 Other cardiomyopathies: Secondary | ICD-10-CM | POA: Insufficient documentation

## 2021-08-09 DIAGNOSIS — Z794 Long term (current) use of insulin: Secondary | ICD-10-CM | POA: Insufficient documentation

## 2021-08-09 DIAGNOSIS — R0602 Shortness of breath: Secondary | ICD-10-CM | POA: Insufficient documentation

## 2021-08-09 DIAGNOSIS — Z7901 Long term (current) use of anticoagulants: Secondary | ICD-10-CM | POA: Insufficient documentation

## 2021-08-09 DIAGNOSIS — Z79899 Other long term (current) drug therapy: Secondary | ICD-10-CM | POA: Insufficient documentation

## 2021-08-09 DIAGNOSIS — Z8616 Personal history of COVID-19: Secondary | ICD-10-CM | POA: Insufficient documentation

## 2021-08-09 DIAGNOSIS — R0600 Dyspnea, unspecified: Secondary | ICD-10-CM | POA: Insufficient documentation

## 2021-08-09 DIAGNOSIS — I110 Hypertensive heart disease with heart failure: Secondary | ICD-10-CM | POA: Insufficient documentation

## 2021-08-09 DIAGNOSIS — I5022 Chronic systolic (congestive) heart failure: Secondary | ICD-10-CM | POA: Insufficient documentation

## 2021-08-09 DIAGNOSIS — Q248 Other specified congenital malformations of heart: Secondary | ICD-10-CM

## 2021-08-09 LAB — BASIC METABOLIC PANEL
Anion gap: 4 — ABNORMAL LOW (ref 5–15)
BUN: 10 mg/dL (ref 6–20)
CO2: 28 mmol/L (ref 22–32)
Calcium: 8.4 mg/dL — ABNORMAL LOW (ref 8.9–10.3)
Chloride: 104 mmol/L (ref 98–111)
Creatinine, Ser: 0.97 mg/dL (ref 0.61–1.24)
GFR, Estimated: >60 mL/min (ref >60)
Glucose, Bld: 136 mg/dL — ABNORMAL HIGH (ref 70–99)
Potassium: 4.6 mmol/L (ref 3.5–5.1)
Sodium: 136 mmol/L (ref 135–145)

## 2021-08-09 MED ORDER — SPIRONOLACTONE 25 MG PO TABS: 25.0000 mg | ORAL_TABLET | Freq: Every day | ORAL | 3 refills | Status: DC

## 2021-08-09 MED ORDER — SPIRONOLACTONE 25 MG PO TABS: 12.5000 mg | ORAL_TABLET | Freq: Every day | ORAL | 3 refills | Status: DC

## 2021-08-09 NOTE — Patient Instructions (Signed)
Medication Changes: ? ?Change Spironolactone to 25 mg daily ? ?Lab Work: ? ?Labs done today, your results will be available in MyChart, we will contact you for abnormal readings. ? ? ?Testing/Procedures: ? ?Repeat blood work in 10 days ? ?Referrals: ? ?none ? ?Special Instructions // Education: ? ?none ? ?Follow-Up in: 3 months  ? ?At the [facility], you and your health needs are our priority. We have a designated team specialized in the treatment of Heart Failure. This Care Team includes your primary Heart Failure Specialized Cardiologist (physician), Advanced Practice Providers (APPs- Physician Assistants and Nurse Practitioners), and Pharmacist who all work together to provide you with the care you need, when you need it.  ? ?You may see any of the following providers on your designated Care Team at your next follow up: ? ?Dr Arvilla Meres ?Dr Marca Ancona ?Tonye Becket, NP ?Robbie Lis, PA ?Jessica Milford,NP ?Anna Genre, PA ?Karle Plumber, PharmD ? ? ?Please be sure to bring in all your medications bottles to every appointment.  ? ?Need to Contact us: ? ?If you have any questions or concerns before your next appointment please send [place] a message through [place] or call our office at [phone].   ? ?TO LEAVE A MESSAGE FOR THE NURSE SELECT OPTION 2, PLEASE LEAVE A MESSAGE INCLUDING: ?YOUR NAME ?DATE OF BIRTH ?CALL BACK NUMBER ?REASON FOR CALL**this is important as we prioritize the call backs ? ?YOU WILL RECEIVE A CALL BACK THE SAME DAY AS LONG AS YOU CALL BEFORE 4:00 PM ? ? ?
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
## 2021-08-11 ENCOUNTER — Other Ambulatory Visit: Payer: Self-pay

## 2021-08-11 ENCOUNTER — Encounter (HOSPITAL_COMMUNITY): Payer: Self-pay | Admitting: Student in an Organized Health Care Education/Training Program

## 2021-08-11 ENCOUNTER — Ambulatory Visit (INDEPENDENT_AMBULATORY_CARE_PROVIDER_SITE_OTHER): Payer: No Payment, Other | Admitting: Student in an Organized Health Care Education/Training Program

## 2021-08-11 VITALS — BP 164/95 | HR 104 | Ht 67.0 in | Wt 135.0 lb

## 2021-08-11 DIAGNOSIS — F411 Generalized anxiety disorder: Secondary | ICD-10-CM

## 2021-08-11 DIAGNOSIS — F4312 Post-traumatic stress disorder, chronic: Secondary | ICD-10-CM

## 2021-08-11 DIAGNOSIS — F331 Major depressive disorder, recurrent, moderate: Secondary | ICD-10-CM

## 2021-08-11 MED ORDER — SERTRALINE HCL 25 MG PO TABS
25.0000 mg | ORAL_TABLET | Freq: Every day | ORAL | 0 refills | Status: DC
  Filled 2021-08-11: qty 30, 30d supply, fill #0
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
## 2021-08-12 ENCOUNTER — Other Ambulatory Visit: Payer: Self-pay

## 2021-08-15 ENCOUNTER — Ambulatory Visit (INDEPENDENT_AMBULATORY_CARE_PROVIDER_SITE_OTHER): Payer: Self-pay | Admitting: *Deleted

## 2021-08-15 NOTE — Telephone Encounter (Signed)
Summary: medication question  ? Pt called in stating Behavioral Health wants to start him on a medication Prazosin, and wanted to know if it was okay for him start that, please advise.   ?  ?Patient was seen last Wednesday- therapist wants to know if medication would be safe to take with his current medication- or if provider has any concerns about him taking. Patient states he does not understand why he was asked to clear medication- but he does have follow up in the morning so he just wanted to check first. Patient advised I would send question to provider for review. ?Reason for Disposition ? [1] Caller has medicine question about med NOT prescribed by PCP AND [2] triager unable to answer question (e.g., compatibility with other med, storage) ? ?Answer Assessment - Initial Assessment Questions ?1. NAME of MEDICATION: "What medicine are you calling about?" ?    Prazosin ?2. QUESTION: "What is your question?" (e.g., double dose of medicine, side effect) ?    Does PCP have any problems/concerns with patient starting this medication ?3. PRESCRIBING HCP: "Who prescribed it?" Reason: if prescribed by specialist, call should be referred to that group. ?    Will be Behavioral health ? ?Protocols used: Medication Question Call-A-AH ? ?

## 2021-08-16 ENCOUNTER — Ambulatory Visit (HOSPITAL_COMMUNITY): Payer: No Payment, Other | Admitting: Licensed Clinical Social Worker

## 2021-08-17 NOTE — Telephone Encounter (Signed)
Behavorial health clinician would like to know if adding prozosin would be safe with other medications. Would like to know from PCP before sending medication.  ?

## 2021-08-19 ENCOUNTER — Other Ambulatory Visit (HOSPITAL_COMMUNITY): Payer: Self-pay

## 2021-08-19 NOTE — Addendum Note (Signed)
Encounter addended by: Noralee Space, RN on: 08/19/2021 8:39 AM ? Actions taken: Order list changed, Diagnosis association updated

## 2021-08-23 IMAGING — CT CT ABD-PELV W/ CM
2 of 5 series · 16 of 46 positions shown, 18 images · IV contrast (omnipaque)
Comparison: None.

CLINICAL DATA: Abdominal pain.  Unresponsive.

EXAM:
CT ABDOMEN AND PELVIS WITH CONTRAST
TECHNIQUE: Multidetector CT imaging of the abdomen and pelvis was performed
using the standard protocol following bolus administration of
intravenous contrast.
CONTRAST:  100mL OMNIPAQUE IOHEXOL 350 MG/ML SOLN

[Series 2: axial st · axial · 0.78mm/px · z∈[-884,-494]mm · 13 of 90 slices shown, 15 images]
[im 6/90  soft-tissue]
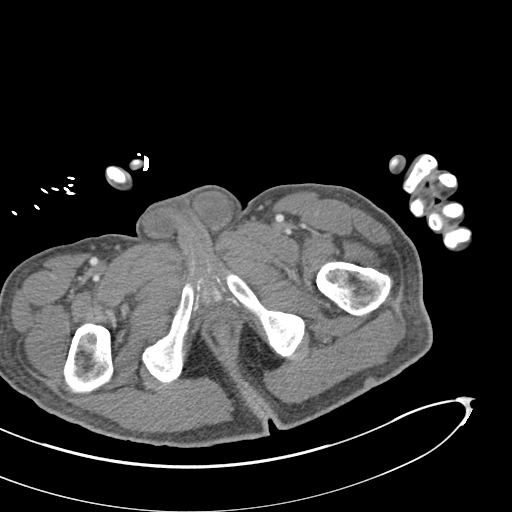
[im 6/90  bone]
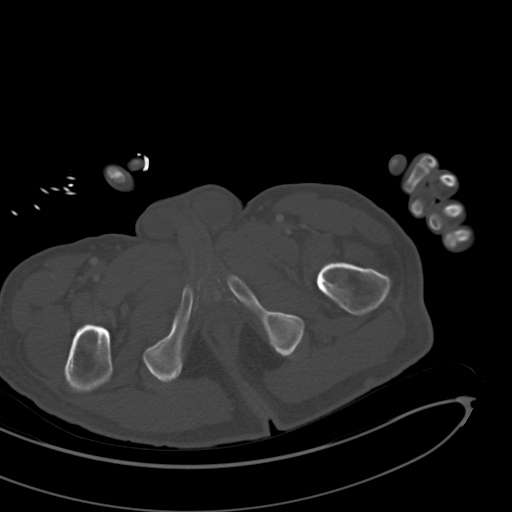
[im 12/90  soft-tissue]
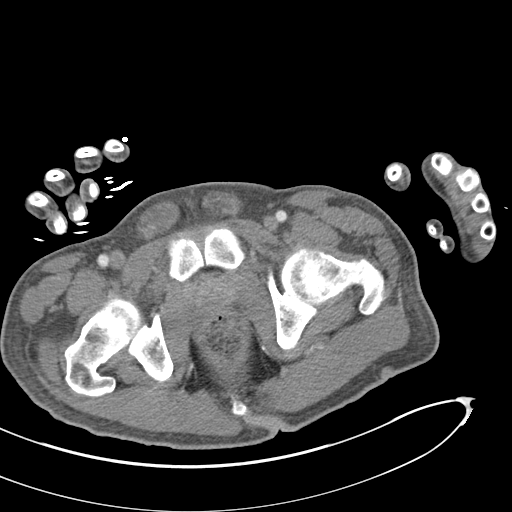
[im 17/90  soft-tissue]
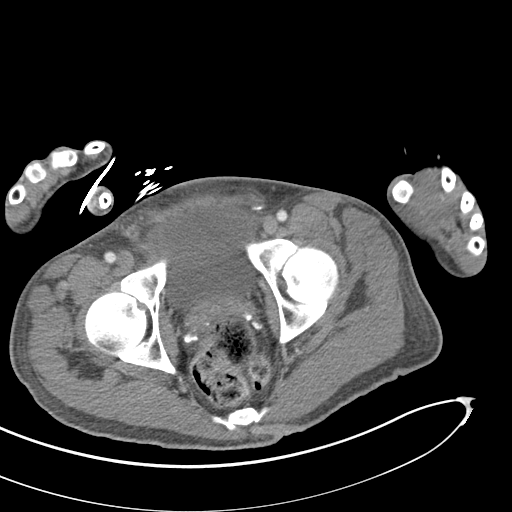
[im 28/90  soft-tissue]
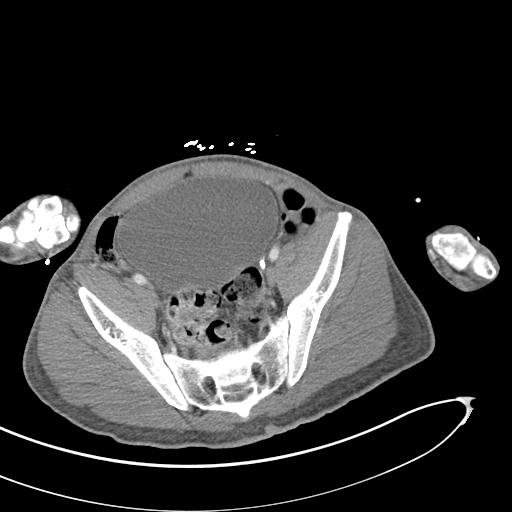
[im 34/90  soft-tissue]
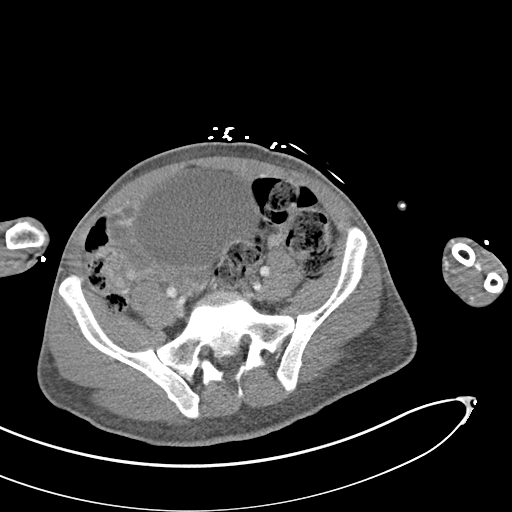
[im 39/90  soft-tissue]
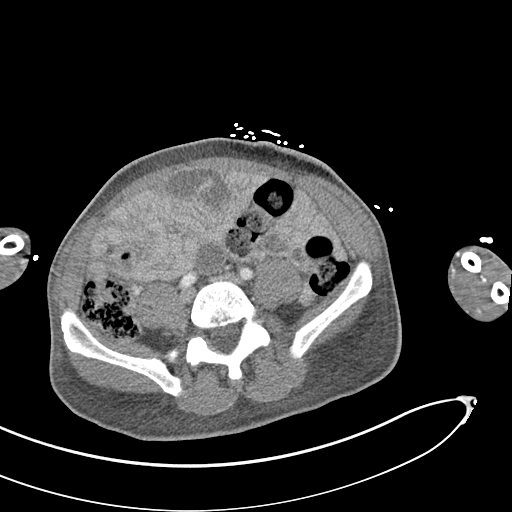
[im 45/90  soft-tissue]
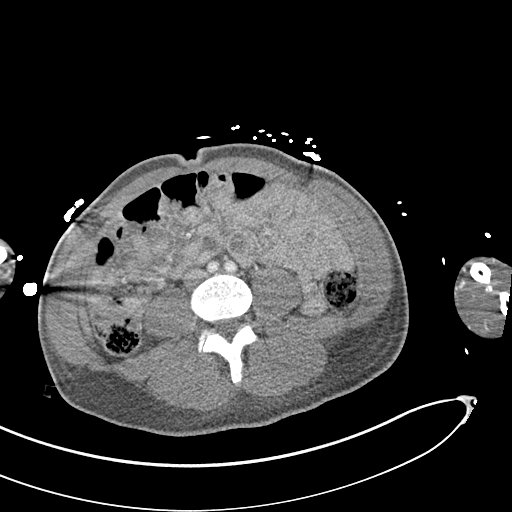
[im 51/90  soft-tissue]
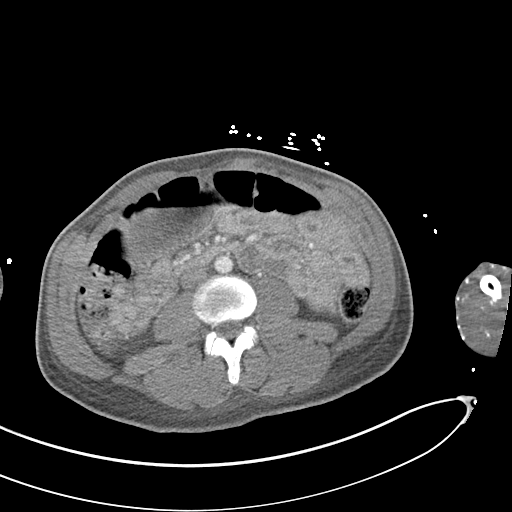
[im 56/90  soft-tissue]
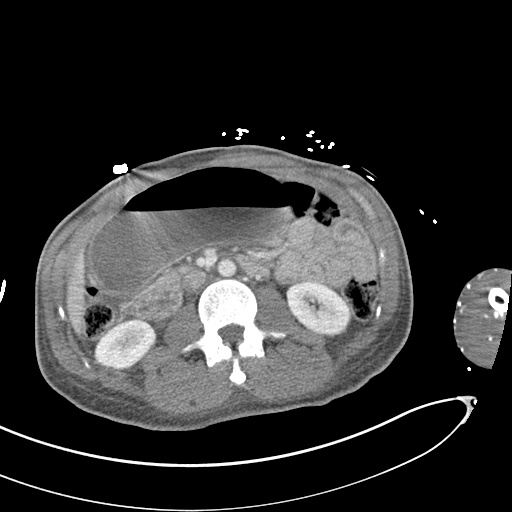
[im 56/90  bone]
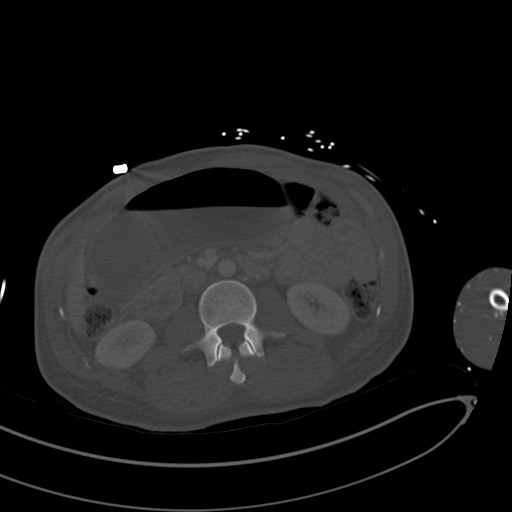
[im 62/90  soft-tissue]
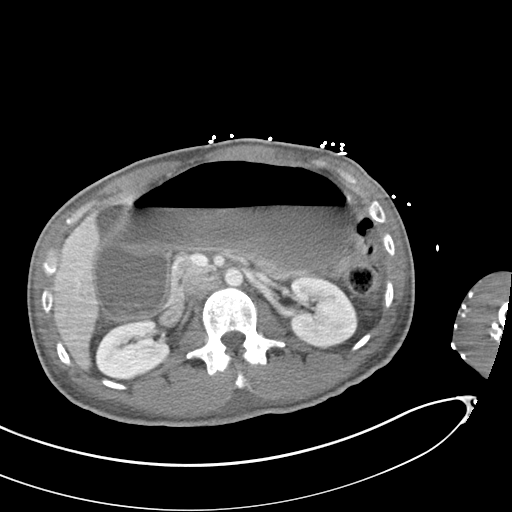
[im 73/90  soft-tissue]
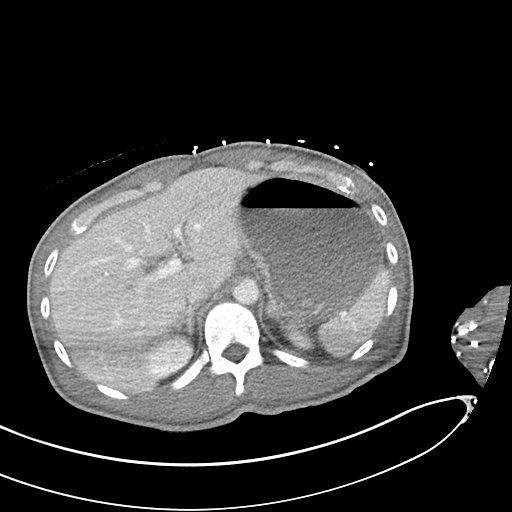
[im 78/90  soft-tissue]
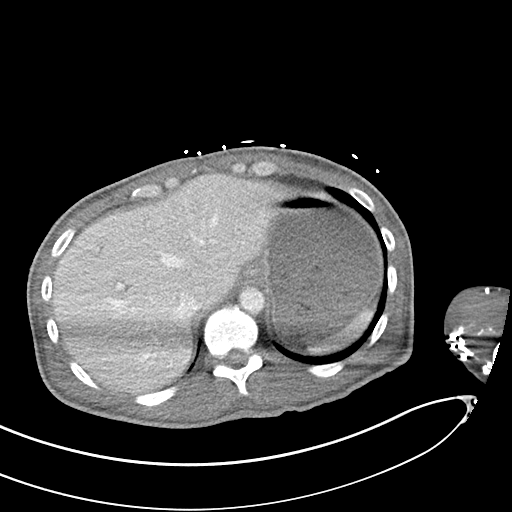
[im 84/90  soft-tissue]
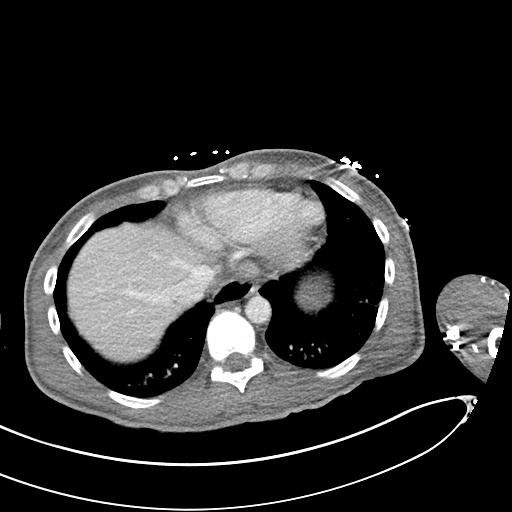

[Series 5: coronal st · coronal · 0.79mm/px · 3 of 73 slices shown]
[im 25/73  soft-tissue]
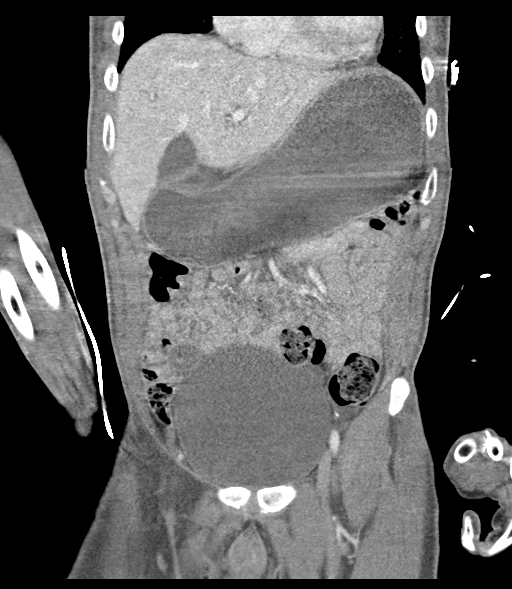
[im 33/73  soft-tissue]
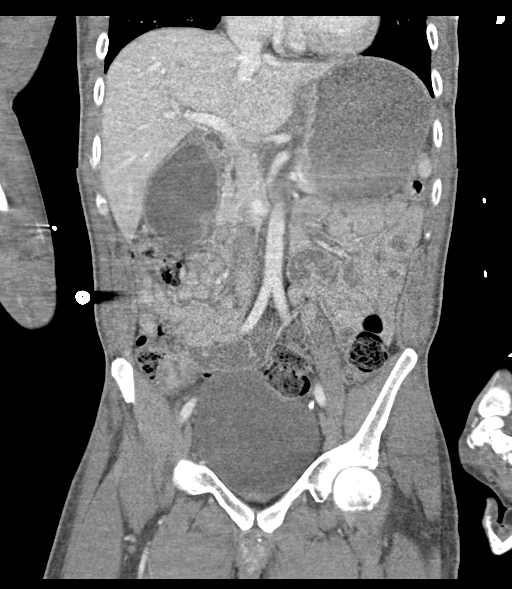
[im 41/73  soft-tissue]
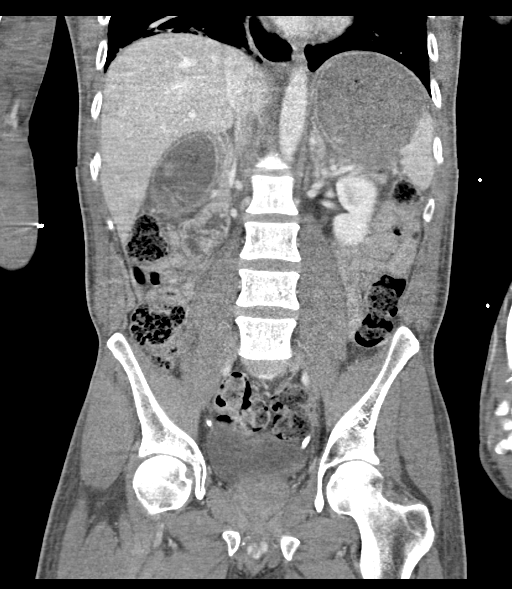

[16 of 46 positions shown; findings below may reference images not displayed]

FINDINGS: Lower chest: Bibasilar airspace disease which may reflect bibasilar
pneumonia versus aspiration pneumonia.

Hepatobiliary: No focal liver abnormality is seen. No gallstones,
gallbladder wall thickening, or biliary dilatation.

Pancreas: Unremarkable. No pancreatic ductal dilatation or
surrounding inflammatory changes.

Spleen: Normal in size without focal abnormality.

Adrenals/Urinary Tract: Adrenal glands are unremarkable. Kidneys are
normal, without renal calculi, focal lesion, or hydronephrosis.
Bladder is unremarkable.

Stomach/Bowel: Severe gastric distension with a large air-fluid
level. Small mild distal to the stomach is decompressed. Appendix
appears normal. No evidence of bowel wall thickening, distention, or
inflammatory changes. Large amount of stool throughout the colon.

Vascular/Lymphatic: No significant vascular findings are present. No
enlarged abdominal or pelvic lymph nodes.

Reproductive: Prostate is unremarkable.

Other: No abdominal wall hernia or abnormality. No abdominopelvic
ascites.

Musculoskeletal: No acute or significant osseous findings.
IMPRESSION: 1. Bibasilar airspace disease as can be seen with bibasilar
pneumonia versus aspiration pneumonia.
2. Severe gastric distension with a large air-fluid level. Small
mild distal to the stomach is decompressed. This appearance can be
seen with gastroparesis versus gastric outlet obstruction or
gastroenteritis.

## 2021-08-23 IMAGING — US US ABDOMEN LIMITED
1 series · 15 of 25 positions shown · non-contrast
Comparison: Same day CT abdomen/pelvis

CLINICAL DATA: Abnormal LFTs

EXAM:
ULTRASOUND ABDOMEN LIMITED RIGHT UPPER QUADRANT

[Series 1: us abdomen limited ruq mc & wl · 15 of 45 slices shown]
[im 1/45]
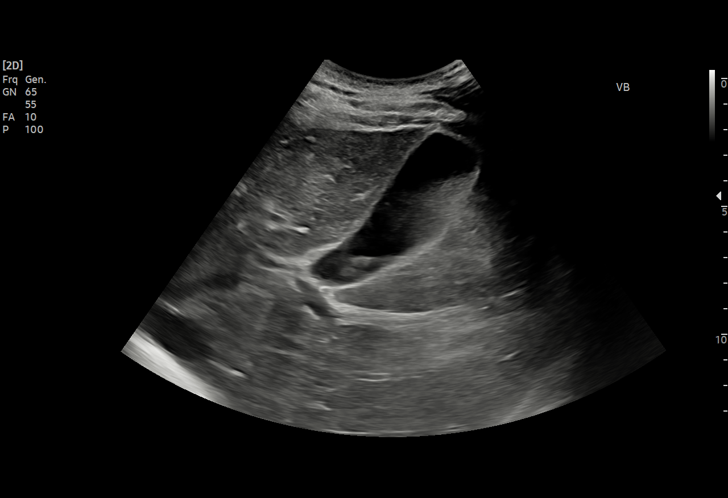
[im 4/45]
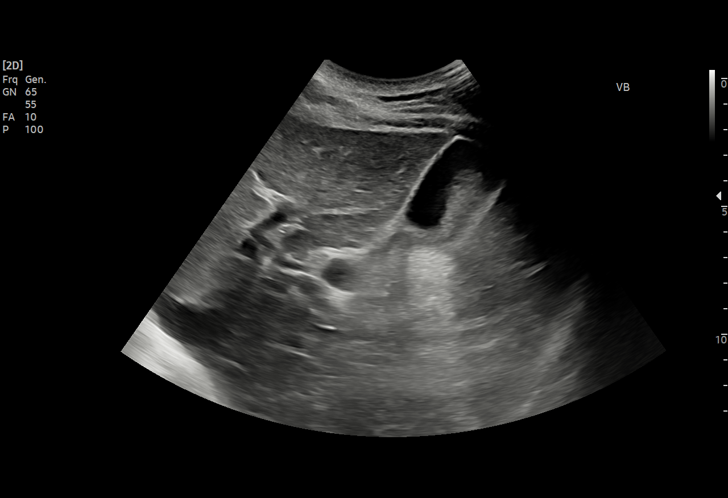
[im 8/45]
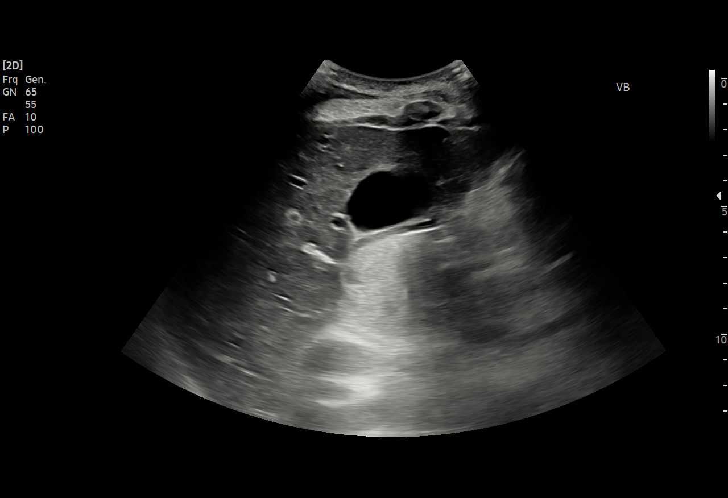
[im 10/45]
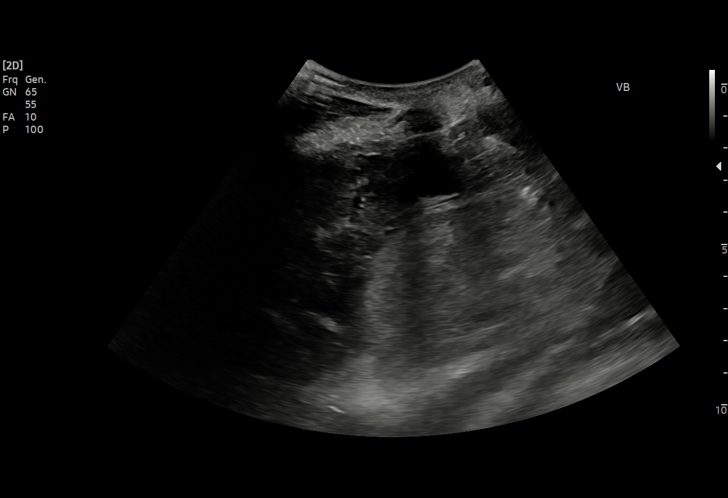
[im 13/45]
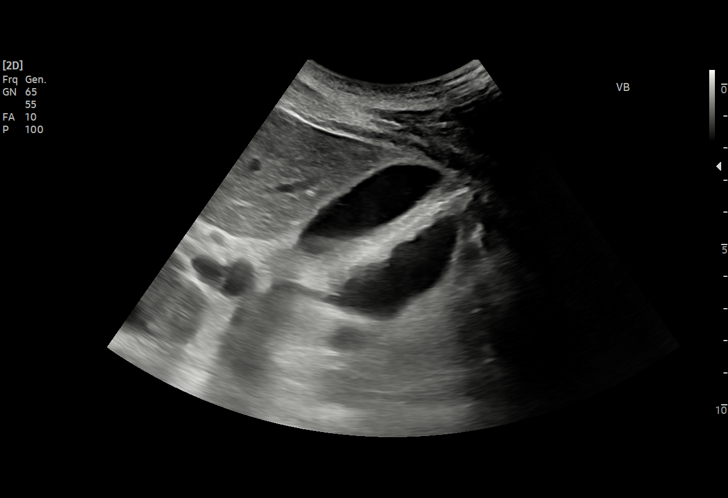
[im 17/45]
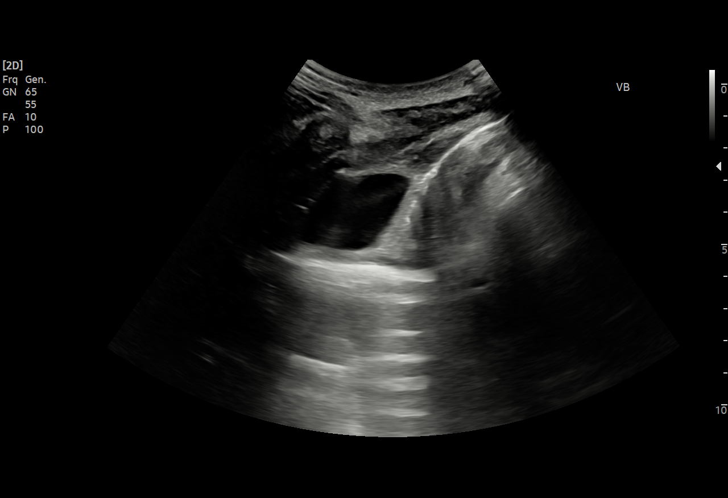
[im 19/45]
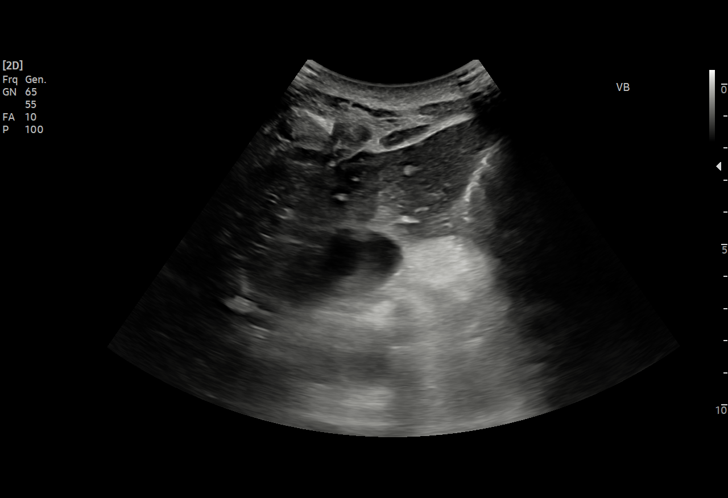
[im 23/45]
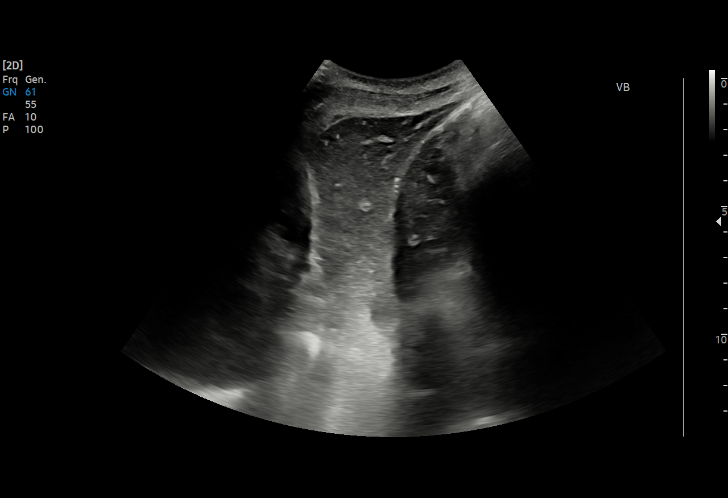
[im 26/45]
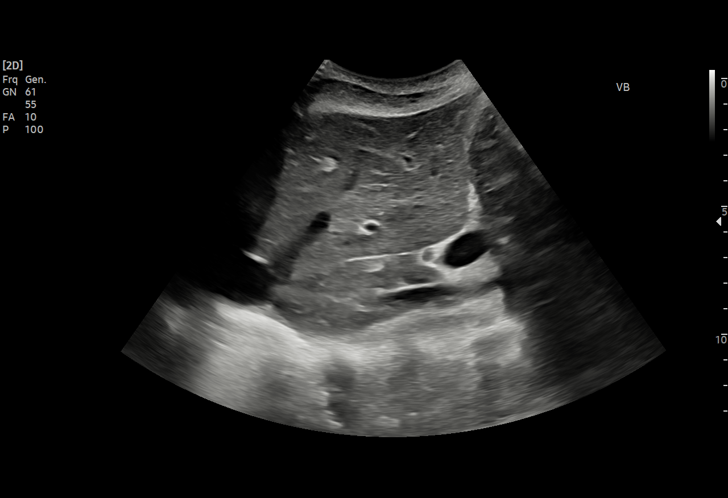
[im 28/45]
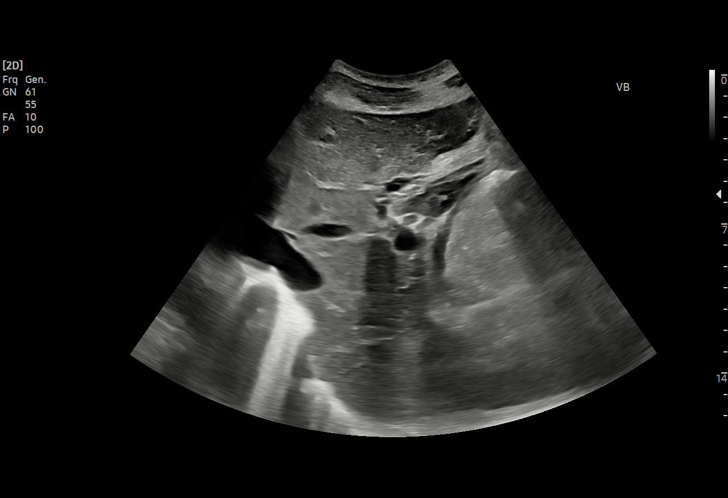
[im 32/45]
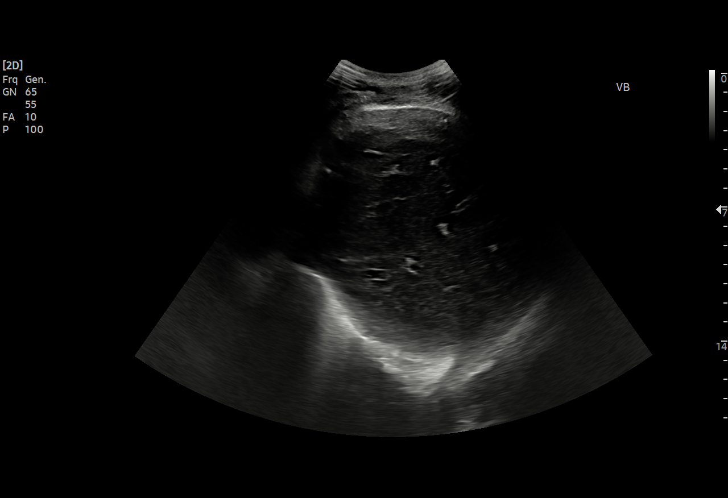
[im 35/45]
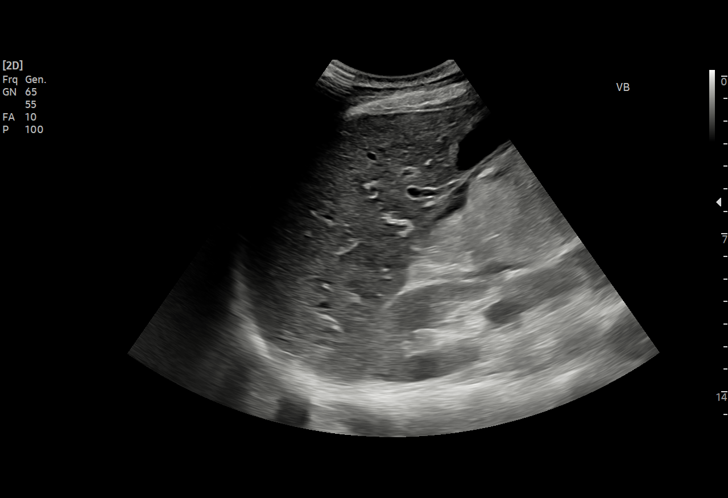
[im 37/45]
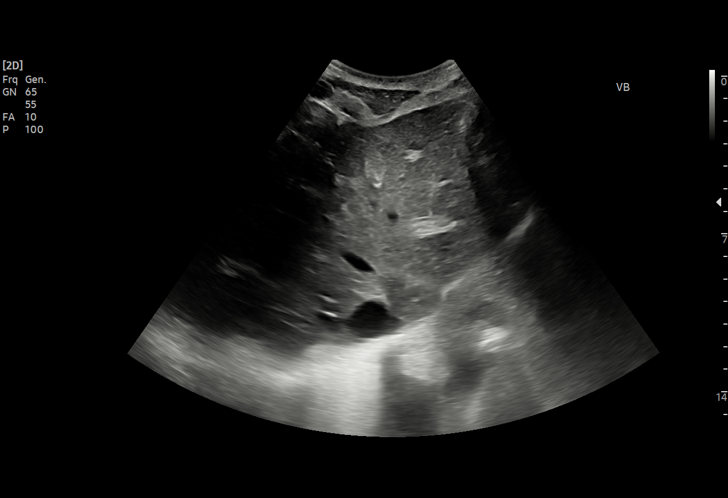
[im 41/45]
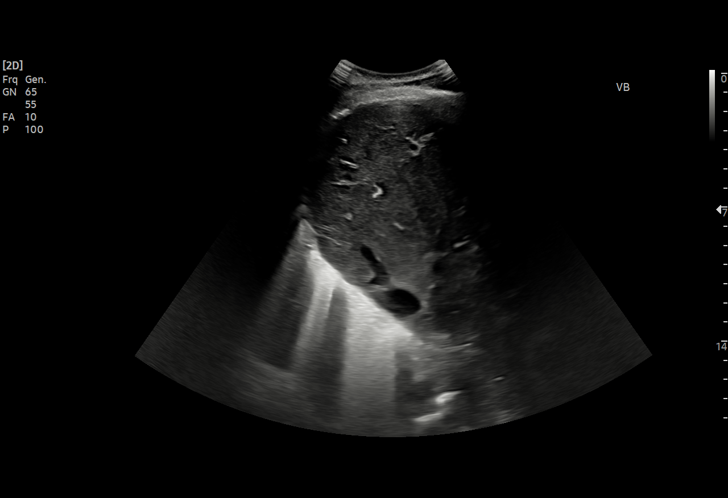
[im 45/45]
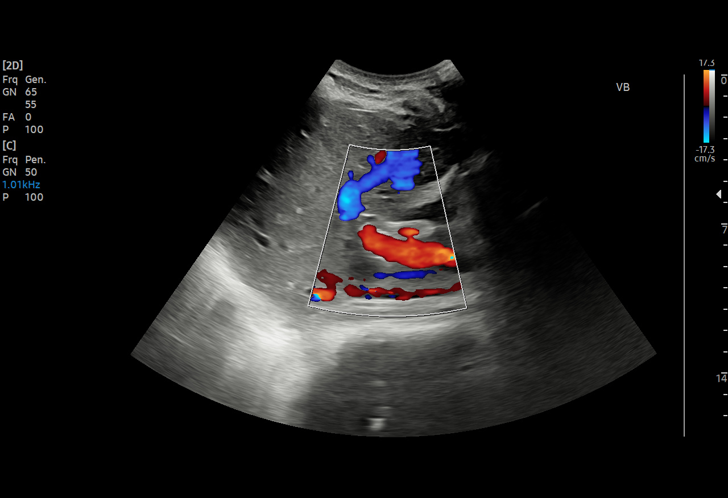

[15 of 25 positions shown; findings below may reference images not displayed]

FINDINGS: Gallbladder:

No gallstones or wall thickening visualized. No sonographic Murphy
sign noted by sonographer.

Common bile duct:

Diameter: 5 mm

Liver:

No focal lesion identified. Within normal limits in parenchymal
echogenicity. Portal vein is patent on color Doppler imaging with
normal direction of blood flow towards the liver.

Other: None.
IMPRESSION: Unremarkable right upper quadrant ultrasound.

## 2021-08-25 ENCOUNTER — Ambulatory Visit (INDEPENDENT_AMBULATORY_CARE_PROVIDER_SITE_OTHER): Payer: No Payment, Other | Admitting: Student in an Organized Health Care Education/Training Program

## 2021-08-25 ENCOUNTER — Other Ambulatory Visit: Payer: Self-pay

## 2021-08-25 ENCOUNTER — Encounter (HOSPITAL_COMMUNITY): Payer: Self-pay | Admitting: Student in an Organized Health Care Education/Training Program

## 2021-08-25 VITALS — BP 137/80 | HR 86 | Ht 67.0 in | Wt 136.0 lb

## 2021-08-25 DIAGNOSIS — F4312 Post-traumatic stress disorder, chronic: Secondary | ICD-10-CM | POA: Diagnosis not present

## 2021-08-25 DIAGNOSIS — F331 Major depressive disorder, recurrent, moderate: Secondary | ICD-10-CM

## 2021-08-25 DIAGNOSIS — F411 Generalized anxiety disorder: Secondary | ICD-10-CM

## 2021-08-25 MED ORDER — PRAZOSIN HCL 1 MG PO CAPS
1.0000 mg | ORAL_CAPSULE | Freq: Every day | ORAL | 0 refills | Status: DC
  Filled 2021-08-25: qty 30, 30d supply, fill #0

## 2021-08-25 MED ORDER — SERTRALINE HCL 50 MG PO TABS
50.0000 mg | ORAL_TABLET | Freq: Every day | ORAL | 0 refills | Status: DC
  Filled 2021-08-25: qty 30, 30d supply, fill #0

## 2021-08-25 NOTE — Progress Notes (Signed)
BH MD/PA/NP OP Progress Note ? ?08/25/2021 4:31 PM ?[name]  ?MRN:  [id number] ? ?Chief Complaint: No chief complaint on file. ? ?HPI:  ?[name] is a 46 yr old male who presents for follow up and medication management.  PPHx is significant for Depression and Anxiety, he has attempted Suicide in the past (most recent OD several years ago), has been to Detox before.  ? ? ?He reports that he is not doing to good today because he ate a beef hotdog earlier and it caused him nausea and to vomit. He reports that when he started the Zoloft he did have some nausea and dizziness but that this did not last long.  He reports he has noticed an improvement since starting the Zoloft.  He reports that he is not as "fussy." He reports some improvement in his sleep because he is now establishing a schedule.  Discussed further increasing the Zoloft and he was agreeable.  Discussed that his PCP did contact us and was agreeable to [place] starting Prazosin.  Discussed taking extra time when changing position, especially when getting up in the morning and drinking plenty of fluid.   ? ?He reports calling 1-800-[employer] but that they wanted medical clearance before they could provide cessation products.  He reports he still plans to quit and has thrown away his lighters and is working on getting the smell of smoke out of his house.  ? ?His establishing appointment at [facility] is on June 7.  He reports no SI, HI, or AVH.  ? ? ?Visit Diagnosis:  ?  ICD-10-CM   ?1. Moderate episode of recurrent major depressive disorder (HCC)  F33.1 sertraline (ZOLOFT) 50 MG tablet  ?  ?2. GAD (generalized anxiety disorder)  F41.1 sertraline (ZOLOFT) 50 MG tablet  ?  ?3. Chronic post-traumatic stress disorder (PTSD)  F43.12 sertraline (ZOLOFT) 50 MG tablet  ?  prazosin (MINIPRESS) 1 MG capsule  ?  ? ? ?Past Psychiatric History: Depression and Anxiety, he has attempted Suicide in the past (most recent OD several years ago), has been to Detox before.   ? ?Past Medical History:  ?Past Medical History:  ?Diagnosis Date  ? CKD (chronic kidney disease), stage II   ? Diabetes mellitus without complication ([place])   ? type 1  ? NICM (nonischemic cardiomyopathy) ([place])   ? Coronary Ca score 0 and no CAD on coronary CTGA 04/2021  ? Polysubstance abuse ([place])   ?  ?Past Surgical History:  ?Procedure Laterality Date  ? DENTAL SURGERY    ? RIGHT HEART CATH N/A 05/13/2021  ? Procedure: RIGHT HEART CATH;  Surgeon: Larey Dresser, MD;  Location: [facility] [facility];  Service: Cardiovascular;  Laterality: N/A;  ? ? ?Family Psychiatric History: Multiple family members with EtOH abuse. ?No known diagnosis' or suicides. ? ?Family History:  ?Family History  ?Problem Relation Age of Onset  ? Arthritis Mother   ? Diabetes Mellitus II Father   ? ? ?Social History:  ?Social History  ? ?Socioeconomic History  ? Marital status: Married  ?  Spouse name: Not on file  ? Number of children: Not on file  ? Years of education: Not on file  ? Highest education level: Not on file  ?Occupational History  ? Not on file  ?Tobacco Use  ? Smoking status: Every Day  ?  Packs/day: 0.50  ?  Years: 20.00  ?  Pack years: 10.00  ?  Types: Cigarettes  ? Smokeless tobacco: Never  ?  Vaping Use  ? Vaping Use: Never used  ?Substance and Sexual Activity  ? Alcohol use: Yes  ?  Alcohol/week: 40.0 standard drinks  ?  Types: 40 Cans of beer per week  ? Drug use: Yes  ?  Frequency: 5.0 times per week  ?  Types: Marijuana, Cocaine  ? Sexual activity: Not on file  ?Other Topics Concern  ? Not on file  ?Social History Narrative  ? Not on file  ? ?Social Determinants of Health  ? ?Financial Resource Strain: Not on file  ?Food Insecurity: Not on file  ?Transportation Needs: Not on file  ?Physical Activity: Not on file  ?Stress: Not on file  ?Social Connections: Not on file  ? ? ?Allergies:  ?Allergies  ?Allergen Reactions  ? Shellfish Allergy Anaphylaxis  ? ? ?Metabolic Disorder Labs: ?Lab Results  ?Component Value Date  ?  HGBA1C 8.8 (A) 06/01/2021  ? MPG 309.18 11/23/2020  ? MPG 294.83 03/13/2017  ? ?No results found for: PROLACTIN ?Lab Results  ?Component Value Date  ? CHOL 101 12/06/2020  ? TRIG 34 12/06/2020  ? HDL 62 12/06/2020  ? CHOLHDL 1.6 12/06/2020  ? VLDL 7 12/06/2020  ? [place] 32 12/06/2020  ? [place] 69 01/28/2018  ? ?No results found for: TSH ? ?Therapeutic Level Labs: ?No results found for: LITHIUM ?No results found for: VALPROATE ?No components found for:  CBMZ ? ?Current Medications: ?Current Outpatient Medications  ?Medication Sig Dispense Refill  ? acetaminophen (TYLENOL) 325 MG tablet Take 650 mg by mouth every 6 (six) hours as needed for moderate pain.    ? aspirin EC 81 MG EC tablet Take 1 tablet (81 mg total) by mouth daily. Swallow whole. 30 tablet 0  ? Aspirin-Acetaminophen-Caffeine (GOODY HEADACHE PO) Take 1 packet by mouth daily as needed (pain).    ? Blood Glucose Monitoring Suppl (TRUE METRIX METER) w/Device KIT Use kit to check blood glucose up to four times daily as directed 1 kit 0  ? carvedilol (COREG) 6.25 MG tablet Take 1 tablet (6.25 mg total) by mouth 2 (two) times daily. 60 tablet 11  ? fluticasone (FLONASE) 50 MCG/ACT nasal spray Place 2 sprays into both nostrils daily.    ? glucose blood (TRUE METRIX BLOOD GLUCOSE TEST) test strip Use as instructed 100 each 12  ? Insulin Glargine (BASAGLAR KWIKPEN) 100 UNIT/ML Inject 28 Units into the skin daily. 9 mL 3  ? insulin lispro (HUMALOG KWIKPEN) 100 UNIT/ML KwikPen Inject 0-12 Units into the skin 3 (three) times daily with meals. Blood sugars 0-150 give 0 units of insulin, 201-250 give 4 units, 251-300 give 6 units, 301-350 give 8 units, 351-400 give 10 units,> 400 give 12 units and call M.D. 15 mL 2  ? Insulin Pen Needle 31G X 8 MM MISC Use as directed 3 (three) times daily before meals. 100 each 11  ? Insulin Syringe-Needle U-100 (TRUEPLUS INSULIN SYRINGE) 31G X 5/16" 0.3 ML MISC [place] as directed as needed. 100 each 0  ? Ipratropium-Albuterol  (COMBIVENT) 20-100 MCG/ACT AERS respimat Inhale 1 puff by mouth into the lungs every 6 hours as needed for wheezing. 4 g 0  ? prazosin (MINIPRESS) 1 MG capsule Take 1 capsule (1 mg total) by mouth at bedtime. 30 capsule 0  ? sacubitril-valsartan (ENTRESTO) 24-26 MG Take 1 tablet by mouth 2 (two) times daily. 180 tablet 3  ? spironolactone (ALDACTONE) 25 MG tablet Take 1 tablet (25 mg total) by mouth daily. 45 tablet 3  ? TRUEplus  Lancets 28G MISC Use as directed 100 each 0  ? sertraline (ZOLOFT) 50 MG tablet Take 1 tablet (50 mg total) by mouth daily. 30 tablet 0  ? ?No current facility-administered medications for this visit.  ? ? ? ?Musculoskeletal: ?Strength & Muscle Tone: within normal limits ?Gait & Station: normal ?Patient leans: N/A ? ?Psychiatric Specialty Exam: ?Review of Systems  ?Respiratory:  Negative for shortness of breath.   ?Cardiovascular:  Negative for chest pain.  ?Gastrointestinal:  Positive for nausea (due to hotdog) and vomiting (due to hotdog). Negative for abdominal pain, constipation and diarrhea.  ?Neurological:  Negative for dizziness and headaches.   ?Blood pressure 137/80, pulse 86, height _0  (1.702 m), weight 136 lb (61.7 kg), SpO2 95 %.Body mass index is 21.3 kg/m?.  ?General Appearance: Casual and Fairly Groomed  ?Eye Contact:  Good  ?Speech:  Clear and Coherent and Normal Rate  ?Volume:  Normal  ?Mood:  Anxious and Dysphoric  ?Affect:  Congruent  ?Thought Process:  Coherent and Goal Directed  ?Orientation:  Full (Time, Place, and Person)  ?Thought Content: Logical   ?Suicidal Thoughts:  No  ?Homicidal Thoughts:  No  ?Memory:  Immediate;   Good ?Recent;   Good  ?Judgement:  Good  ?Insight:  Good  ?Psychomotor Activity:  Normal  ?Concentration:  Concentration: Good and Attention Span: Good  ?Recall:  Good  ?Fund of Knowledge: Good  ?Language: Good  ?Akathisia:  Negative  ?Handed:  Right  ?AIMS (if indicated): not done  ?Assets:  Communication Skills ?Desire for  Improvement ?Housing ?Resilience  ?ADL's:  Intact  ?Cognition: WNL  ?Sleep:   fair to poor  ? ?Screenings: ?GAD-7   ? ?[place] Office Visit from 08/03/2021 in [place] Office Visit from 06/01/2021 in [facility]

## 2021-08-26 ENCOUNTER — Other Ambulatory Visit: Payer: Self-pay

## 2021-08-28 MED ORDER — TRUE METRIX METER W/DEVICE KIT
PACK | 0 refills | Status: AC
  Filled 2021-08-28 – 2021-09-07 (×2): qty 1, 30d supply, fill #0
  Filled 2021-09-26: qty 1, fill #0
  Filled 2021-10-21: qty 1, 30d supply, fill #0

## 2021-08-28 MED ORDER — TRUEPLUS LANCETS 28G MISC
1.0000 | 0 refills | Status: AC | PRN
  Filled 2021-08-28 – 2021-09-26 (×3): qty 100, 25d supply, fill #0

## 2021-08-29 ENCOUNTER — Other Ambulatory Visit: Payer: Self-pay

## 2021-09-05 ENCOUNTER — Other Ambulatory Visit: Payer: Self-pay

## 2021-09-07 ENCOUNTER — Ambulatory Visit (INDEPENDENT_AMBULATORY_CARE_PROVIDER_SITE_OTHER): Payer: Self-pay | Admitting: Primary Care

## 2021-09-07 ENCOUNTER — Other Ambulatory Visit: Payer: Self-pay

## 2021-09-07 ENCOUNTER — Encounter (INDEPENDENT_AMBULATORY_CARE_PROVIDER_SITE_OTHER): Payer: Self-pay | Admitting: Primary Care

## 2021-09-07 ENCOUNTER — Other Ambulatory Visit (HOSPITAL_COMMUNITY): Payer: Self-pay | Admitting: Student in an Organized Health Care Education/Training Program

## 2021-09-07 VITALS — BP 115/78 | HR 83 | Temp 98.2°F | Ht 68.0 in | Wt 139.6 lb

## 2021-09-07 DIAGNOSIS — E1069 Type 1 diabetes mellitus with other specified complication: Secondary | ICD-10-CM

## 2021-09-07 DIAGNOSIS — F4312 Post-traumatic stress disorder, chronic: Secondary | ICD-10-CM

## 2021-09-07 DIAGNOSIS — F4323 Adjustment disorder with mixed anxiety and depressed mood: Secondary | ICD-10-CM

## 2021-09-07 DIAGNOSIS — F331 Major depressive disorder, recurrent, moderate: Secondary | ICD-10-CM

## 2021-09-07 DIAGNOSIS — F411 Generalized anxiety disorder: Secondary | ICD-10-CM

## 2021-09-07 LAB — POCT GLYCOSYLATED HEMOGLOBIN (HGB A1C): Hemoglobin A1C: 8.8 % — AB (ref 4.0–5.6)

## 2021-09-07 MED ORDER — BASAGLAR KWIKPEN 100 UNIT/ML ~~LOC~~ SOPN
16.0000 [IU] | PEN_INJECTOR | Freq: Two times a day (BID) | SUBCUTANEOUS | 3 refills | Status: DC
Start: 2021-09-07 — End: 2021-11-29
  Filled 2021-09-07 – 2021-09-26 (×2): qty 9, 28d supply, fill #0
  Filled 2021-10-21: qty 9, 28d supply, fill #1

## 2021-09-09 ENCOUNTER — Other Ambulatory Visit: Payer: Self-pay

## 2021-09-09 MED ORDER — PRAZOSIN HCL 1 MG PO CAPS
1.0000 mg | ORAL_CAPSULE | Freq: Every day | ORAL | 0 refills | Status: DC
  Filled 2021-09-09 – 2021-09-26 (×2): qty 30, 30d supply, fill #0

## 2021-09-09 MED ORDER — SERTRALINE HCL 50 MG PO TABS
50.0000 mg | ORAL_TABLET | Freq: Every day | ORAL | 0 refills | Status: DC
  Filled 2021-09-09 – 2021-09-26 (×2): qty 30, 30d supply, fill #0

## 2021-09-09 NOTE — Telephone Encounter (Signed)
Contacted by pharmacy that patient needed refills of his medications.  These were sent in.  Refills Sent- -Zoloft 50 mg; 30 tablets with 0 refills -Prazosin 1 mg; 30 tablets with 0 refills    Arna Snipe MD Resident

## 2021-09-12 NOTE — Progress Notes (Signed)
[place], is a 46 y.o. male  HRC:[id number]  WOE:[id number]  DOB - 07/24/1975  Chief Complaint  Patient presents with   Follow-up    diabetes       Subjective:   Echo Sykora is a 46 y.o. male here today for a follow up visit for anxiety and depression with homicidal thoughts.  Since our last visit it has been very challenging to remain stable competent in coexists with family members.  States he enjoys and looks forward to his appointment this is his self haven and he knows everyone cares about him and tries to help.  Although he stated he was okay as we do like to talk with Alberteen Spindle.  Patient lite up and said can I.  I called Alberteen Spindle -PCP left the room and gave them privacy.  At this point he was elated and very thankful for the staff knowing they cared about.  Patient has No headache, No chest pain, No abdominal pain - No Nausea, No new weakness tingling or numbness, No Cough - shortness of breath.  No problems updated.  Allergies  Allergen Reactions   Shellfish Allergy Anaphylaxis    Past Medical History:  Diagnosis Date   CKD (chronic kidney disease), stage II    Diabetes mellitus without complication (HCC)    type 1   NICM (nonischemic cardiomyopathy) (HCC)    Coronary Ca score 0 and no CAD on coronary CTGA 04/2021   Polysubstance abuse ([place])     Current Outpatient Medications on File Prior to Visit  Medication Sig Dispense Refill   acetaminophen (TYLENOL) 325 MG tablet Take 650 mg by mouth every 6 (six) hours as needed for moderate pain.     aspirin EC 81 MG EC tablet Take 1 tablet (81 mg total) by mouth daily. Swallow whole. 30 tablet 0   Aspirin-Acetaminophen-Caffeine (GOODY HEADACHE PO) Take 1 packet by mouth daily as needed (pain).     Blood Glucose Monitoring Suppl (TRUE METRIX METER) w/Device KIT Use kit to check blood glucose up to four times daily as directed 1 kit 0   carvedilol (COREG) 6.25 MG tablet Take 1  tablet (6.25 mg total) by mouth 2 (two) times daily. 60 tablet 11   fluticasone (FLONASE) 50 MCG/ACT nasal spray Place 2 sprays into both nostrils daily.     glucose blood (TRUE METRIX BLOOD GLUCOSE TEST) test strip Use as instructed 100 each 12   insulin lispro (HUMALOG KWIKPEN) 100 UNIT/ML KwikPen Inject 0-12 Units into the skin 3 (three) times daily with meals. Blood sugars 0-150 give 0 units of insulin, 201-250 give 4 units, 251-300 give 6 units, 301-350 give 8 units, 351-400 give 10 units,> 400 give 12 units and call M.D. 15 mL 2   Insulin Pen Needle 31G X 8 MM MISC Use as directed 3 (three) times daily before meals. 100 each 11   Insulin Syringe-Needle U-100 (TRUEPLUS INSULIN SYRINGE) 31G X 5/16" 0.3 ML MISC [place] as directed as needed. 100 each 0   Ipratropium-Albuterol (COMBIVENT) 20-100 MCG/ACT AERS respimat Inhale 1 puff by mouth into the lungs every 6 hours as needed for wheezing. 4 g 0   sacubitril-valsartan (ENTRESTO) 24-26 MG Take 1 tablet by mouth 2 (two) times daily. 180 tablet 3   spironolactone (ALDACTONE) 25 MG tablet Take 1 tablet (25 mg total) by mouth daily. 45 tablet 3   TRUEplus Lancets 28G MISC Use as directed 100 each 0   No current facility-administered  medications on file prior to visit.    Objective:   Vitals:   09/07/21 1103  BP: 115/78  Pulse: 83  Temp: 98.2 F (36.8 C)  TempSrc: Oral  SpO2: 100%  Weight: 139 lb 9.6 oz (63.3 kg)  Height: _0  (1.727 m)    Exam General appearance : Awake, alert, not in any distress. Speech Clear. Not toxic looking HEENT: Atraumatic and Normocephalic, pupils equally reactive to light and accomodation Neck: Supple, no JVD. No cervical lymphadenopathy.  Chest: Good air entry bilaterally, no added sounds  CVS: S1 S2 regular, no murmurs.  Abdomen: Bowel sounds present, Non tender and not distended with no gaurding, rigidity or rebound. Extremities: B/L Lower Ext shows no edema, both legs are warm to touch Neurology: Awake  alert, and oriented X 3, CN II-XII intact, Non focal Skin: No Rash  Data Review Lab Results  Component Value Date   HGBA1C 8.8 (A) 09/07/2021   HGBA1C 8.8 (A) 06/01/2021   HGBA1C 8.4 (A) 03/01/2021    Assessment & Plan   1. Adjustment reaction with anxiety and depression Currently not being treated-scheduled sessions with PCP to discuss how he is feeling and actions he is taking and the contract we verbally may no harm to self or others if suicidal call behavioral health immediately and leave a voice message.  Offered to send him out for counseling does not feel that he can trust anyone else at this time.  PCP goal is to find a therapist that he can connect with and get proper treatment.  Does not want to take any anxiety or antidepressant medication at this time.  2. Type 1 diabetes mellitus with other specified complication (HCC) - HgB A1c 8.8 remains uncontrolled however stable the last 3 months his A1c was 8.8.  Increased his he is managed by insulin-states adherence to his medication.  We discussed complications with uncontrolled diabetes example given more diabetic retinopathy, amputations, renal dialysis.  Increased his bagsalar to 16 units twice daily   Patient have been counseled extensively about nutrition and exercise. Other issues discussed during this visit include: low cholesterol diet, weight control and daily exercise, foot care, annual eye examinations at Ophthalmology, importance of adherence with medications and regular follow-up. We also discussed long term complications of uncontrolled diabetes and hypertension.   Return in about 2 months (around 11/07/2021) for depression/T2D.  The patient was given clear instructions to go to ER or return to medical center if symptoms don't improve, worsen or new problems develop. The patient verbalized understanding. The patient was told to call to get lab results if they haven't heard anything in the next week.   This note has been  created with Surveyor, quantity. Any transcriptional errors are unintentional.   Kerin Perna, NP 09/12/2021, 8:21 AM

## 2021-09-14 ENCOUNTER — Other Ambulatory Visit: Payer: Self-pay

## 2021-09-15 ENCOUNTER — Ambulatory Visit (HOSPITAL_COMMUNITY): Payer: No Payment, Other | Admitting: Licensed Clinical Social Worker

## 2021-09-15 ENCOUNTER — Telehealth (HOSPITAL_COMMUNITY): Payer: Self-pay | Admitting: Licensed Clinical Social Worker

## 2021-09-15 NOTE — Telephone Encounter (Signed)
LCSW sent two links to pt phone with no response. LCSW attempted to call pt, but VM box not set up. This is pt 2nd no show in a row for therapy. It will be advised by this LCSW to offer this pt walk in moving forward

## 2021-09-26 ENCOUNTER — Other Ambulatory Visit: Payer: Self-pay

## 2021-09-29 ENCOUNTER — Encounter (HOSPITAL_COMMUNITY): Payer: No Payment, Other | Admitting: Student in an Organized Health Care Education/Training Program

## 2021-09-30 ENCOUNTER — Telehealth (HOSPITAL_COMMUNITY): Payer: Self-pay | Admitting: Student in an Organized Health Care Education/Training Program

## 2021-09-30 NOTE — Telephone Encounter (Signed)
Clld pt to reschedule appt. Pt answered & hung up. Clld pt bck no ans unable to lvm/AL

## 2021-10-06 ENCOUNTER — Encounter (HOSPITAL_COMMUNITY): Payer: No Payment, Other | Admitting: Student in an Organized Health Care Education/Training Program

## 2021-10-13 ENCOUNTER — Other Ambulatory Visit: Payer: Self-pay

## 2021-10-13 ENCOUNTER — Ambulatory Visit (INDEPENDENT_AMBULATORY_CARE_PROVIDER_SITE_OTHER): Payer: No Payment, Other | Admitting: Student in an Organized Health Care Education/Training Program

## 2021-10-13 ENCOUNTER — Encounter (HOSPITAL_COMMUNITY): Payer: Self-pay | Admitting: Student in an Organized Health Care Education/Training Program

## 2021-10-13 VITALS — BP 130/79 | HR 80 | Ht 68.0 in | Wt 134.0 lb

## 2021-10-13 DIAGNOSIS — F411 Generalized anxiety disorder: Secondary | ICD-10-CM

## 2021-10-13 DIAGNOSIS — F331 Major depressive disorder, recurrent, moderate: Secondary | ICD-10-CM

## 2021-10-13 DIAGNOSIS — F4312 Post-traumatic stress disorder, chronic: Secondary | ICD-10-CM

## 2021-10-13 MED ORDER — SERTRALINE HCL 50 MG PO TABS
75.0000 mg | ORAL_TABLET | Freq: Every day | ORAL | 0 refills | Status: DC
  Filled 2021-10-13 – 2021-10-21 (×2): qty 45, 30d supply, fill #0

## 2021-10-13 NOTE — Progress Notes (Signed)
BH MD/PA/NP OP Progress Note  10/13/2021 6:05 PM [name]  MRN:  [id number]  Chief Complaint:  Chief Complaint  Patient presents with   Medication Management   HPI:  [name] is a 47 yr old male who presents for follow up and medication management.  PPHx is significant for Depression and Anxiety, he has attempted Suicide in the past (most recent OD several years ago), has been to Detox before.   He reports that he did make his appointment at [facility].  He states that he received a coronary cath while there and the doctor has told him he has a condition that has only been seen twice by him.  He states he will need to see additional specialists at [facility].  He states that he will be returning to [facility] on the 10th to get a heart monitor placed.  He states that he is had significant sleeping spells recently that sometimes he will sleep for 22 hours.  He reports that because of this the cardiologist at [facility] has recommended a sleep study for further work-up.  He reports he continues to be unable to work because of this condition which is adding to his stress.  He states that he also has continued family issues.  He states his brother who moved away and joined the [employer] and has been living in [place] since he was 85 came to visit for a few weeks.  He states he barely saw his brother at all and that their father made it seem like it was the patient's fault they were not spending more time together.  He states that all this has just further frustrated him.  He reports that he has seen benefit from Zoloft.  Discussed further increasing it was agreeable to this.  Discussed that sleeping spells like the ones he is describing are not associated with prazosin but given they started after he started the medicine we will stop it for now.  Discussed we can restart it later if the sleeping spells continue and his nightmares worsen.  Discussed with him what to do in the event of a future crisis.  Discussed  that he can return to the [facility], go to [facility], go to the nearest ED, or call 911 or [phone].   He reported understanding and had no concerns.  He reports no SI, HI, or AVH.  He reports no [place], Ideas of Reference, or other First Rank symptoms.  He reports his sleep has been too much.  He reports that his appetite continues to be poor.  Discussed seeing him after his next appointment at [facility] unless he needed to be seen sooner and he reported understanding and had no other concerns at present.  Visit Diagnosis:    ICD-10-CM   1. Moderate episode of recurrent major depressive disorder (HCC)  F33.1 sertraline (ZOLOFT) 50 MG tablet    2. GAD (generalized anxiety disorder)  F41.1 sertraline (ZOLOFT) 50 MG tablet    3. Chronic post-traumatic stress disorder (PTSD)  F43.12 sertraline (ZOLOFT) 50 MG tablet      Past Psychiatric History: Depression and Anxiety, he has attempted Suicide in the past (most recent OD several years ago), has been to Detox before.   Past Medical History:  Past Medical History:  Diagnosis Date   CKD (chronic kidney disease), stage II    Diabetes mellitus without complication (HCC)    type 1   NICM (nonischemic cardiomyopathy) (HCC)    Coronary Ca score 0 and no CAD  on coronary CTGA 04/2021   Polysubstance abuse [facility])     Past Surgical History:  Procedure Laterality Date   DENTAL SURGERY     RIGHT HEART CATH N/A 05/13/2021   Procedure: RIGHT HEART CATH;  Surgeon: Larey Dresser, MD;  Location: [place] [facility];  Service: Cardiovascular;  Laterality: N/A;    Family Psychiatric History: Multiple family members with EtOH abuse. No known diagnosis' or suicides.  Family History:  Family History  Problem Relation Age of Onset   Arthritis Mother    Diabetes Mellitus II Father     Social History:  Social History   Socioeconomic History   Marital status: Married    Spouse name: Not on file   Number of children: Not on file   Years of education: Not on file    Highest education level: Not on file  Occupational History   Not on file  Tobacco Use   Smoking status: Every Day    Packs/day: 0.50    Years: 20.00    Total pack years: 10.00    Types: Cigarettes   Smokeless tobacco: Never  Vaping Use   Vaping Use: Never used  Substance and Sexual Activity   Alcohol use: Yes    Alcohol/week: 40.0 standard drinks of alcohol    Types: 40 Cans of beer per week   Drug use: Yes    Frequency: 5.0 times per week    Types: Marijuana, Cocaine   Sexual activity: Not on file  Other Topics Concern   Not on file  Social History Narrative   Not on file   Social Determinants of Health   Financial Resource Strain: Not on file  Food Insecurity: Not on file  Transportation Needs: Not on file  Physical Activity: Not on file  Stress: Not on file  Social Connections: Not on file    Allergies:  Allergies  Allergen Reactions   Shellfish Allergy Anaphylaxis    Metabolic Disorder Labs: Lab Results  Component Value Date   HGBA1C 8.8 (A) 09/07/2021   MPG 309.18 11/23/2020   MPG 294.83 03/13/2017   No results found for: "PROLACTIN" Lab Results  Component Value Date   CHOL 101 12/06/2020   TRIG 34 12/06/2020   HDL 62 12/06/2020   CHOLHDL 1.6 12/06/2020   VLDL 7 12/06/2020   LDLCALC 32 12/06/2020   LDLCALC 69 01/28/2018   No results found for: "TSH"  Therapeutic Level Labs: No results found for: "LITHIUM" No results found for: "VALPROATE" No results found for: "CBMZ"  Current Medications: Current Outpatient Medications  Medication Sig Dispense Refill   sertraline (ZOLOFT) 50 MG tablet Take 1.5 tablets (75 mg total) by mouth daily. 45 tablet 0   acetaminophen (TYLENOL) 325 MG tablet Take 650 mg by mouth every 6 (six) hours as needed for moderate pain.     aspirin EC 81 MG EC tablet Take 1 tablet (81 mg total) by mouth daily. Swallow whole. 30 tablet 0   Aspirin-Acetaminophen-Caffeine (GOODY HEADACHE PO) Take 1 packet by mouth daily as needed  (pain).     Blood Glucose Monitoring Suppl (TRUE METRIX METER) w/Device KIT Use kit to check blood glucose up to four times daily as directed 1 kit 0   carvedilol (COREG) 6.25 MG tablet Take 1 tablet (6.25 mg total) by mouth 2 (two) times daily. 60 tablet 11   fluticasone (FLONASE) 50 MCG/ACT nasal spray Place 2 sprays into both nostrils daily.     glucose blood (TRUE METRIX BLOOD GLUCOSE  TEST) test strip Use as instructed 100 each 12   Insulin Glargine (BASAGLAR KWIKPEN) 100 UNIT/ML Inject 16 Units into the skin 2 (two) times daily. 9 mL 3   insulin lispro (HUMALOG KWIKPEN) 100 UNIT/ML KwikPen Inject 0-12 Units into the skin 3 (three) times daily with meals. Blood sugars 0-150 give 0 units of insulin, 201-250 give 4 units, 251-300 give 6 units, 301-350 give 8 units, 351-400 give 10 units,> 400 give 12 units and call M.D. 15 mL 2   Insulin Pen Needle 31G X 8 MM MISC Use as directed 3 (three) times daily before meals. 100 each 11   Insulin Syringe-Needle U-100 (TRUEPLUS INSULIN SYRINGE) 31G X 5/16" 0.3 ML MISC [place] as directed as needed. 100 each 0   Ipratropium-Albuterol (COMBIVENT) 20-100 MCG/ACT AERS respimat Inhale 1 puff by mouth into the lungs every 6 hours as needed for wheezing. 4 g 0   prazosin (MINIPRESS) 1 MG capsule Take 1 capsule (1 mg total) by mouth once nightly at bedtime. 30 capsule 0   sacubitril-valsartan (ENTRESTO) 24-26 MG Take 1 tablet by mouth 2 (two) times daily. 180 tablet 3   spironolactone (ALDACTONE) 25 MG tablet Take 1 tablet (25 mg total) by mouth daily. 45 tablet 3   TRUEplus Lancets 28G MISC Use as directed up to 4 times daily. 100 each 0   No current facility-administered medications for this visit.     Musculoskeletal: Strength & Muscle Tone: within normal limits Gait & Station: normal Patient leans: N/A  Psychiatric Specialty Exam: Review of Systems  Respiratory:  Positive for shortness of breath.   Cardiovascular:  Negative for chest pain.   Gastrointestinal:  Negative for abdominal pain, constipation, diarrhea, nausea and vomiting.  Neurological:  Positive for dizziness. Negative for weakness and headaches.  Psychiatric/Behavioral:  Negative for hallucinations and self-injury.     Blood pressure 130/79, pulse 80, height 5' 8"  (1.727 m), weight 134 lb (60.8 kg), SpO2 100 %.Body mass index is 20.37 kg/m.  General Appearance: Casual and Fairly Groomed  Eye Contact:  Good  Speech:  Clear and Coherent and Normal Rate  Volume:  Normal  Mood:  Depressed  Affect:  Congruent and Depressed  Thought Process:  Coherent and Goal Directed  Orientation:  Full (Time, Place, and Person)  Thought Content: Logical   Suicidal Thoughts:  No  Homicidal Thoughts:  No  Memory:  Immediate;   Good Recent;   Good  Judgement:  Fair  Insight:  Fair  Psychomotor Activity:  Normal  Concentration:  Concentration: Good and Attention Span: Good  Recall:  Good  Fund of Knowledge: Good  Language: Good  Akathisia:  Negative  Handed:  Right  AIMS (if indicated): not done  Assets:  Communication Skills Desire for Improvement Housing Resilience  ADL's:  Intact  Cognition: WNL  Sleep:   sleeping too much sometimes 22 hours   Screenings: GAD-7    Flowsheet Row Office Visit from 09/07/2021 in [place] Office Visit from 08/03/2021 in [place] Office Visit from 06/01/2021 in [place] Office Visit from 03/01/2021 in [place] Office Visit from 01/24/2021 in [place]  Total GAD-7 Score 20 21 21 9 21       PHQ2-9    [place] Office Visit from 09/07/2021 in [place] Office Visit from 08/03/2021 in [place] Counselor from 06/20/2021 in [facility]  [facility] Office Visit from 06/01/2021 in [place] Office Visit from 03/01/2021 in [place]  PHQ-2 Total Score 6 5 6 6 2   PHQ-9 Total Score 22 23 24 24 8       [place] Office Visit from 09/07/2021 in [place] Counsel[facility] from 06/20/2021 in [facility] Admission (Discharged) from 05/13/2021 in [place] CATH [facility]  C-SSRS RISK CATEGORY Error: Question 1 not populated Moderate Risk No Risk        Assessment and Plan:  [name] is a 46 yr old male who presents for follow up and medication management.  PPHx is significant for Depression and Anxiety, he has attempted Suicide in the past (most recent OD several years ago), has been to Detox before.   Ray continues to have significant depression and anxiety over his medical status and inability to work.  He has had improvement with the Zoloft so we will again increase it.  Since starting the prazosin he has begun to have sleeping spells that can last up to 22 hours.  Though this is in unheard of reaction to prazosin given the timing we will still stop the prazosin.  If he continues to have the sleeping issues or his nightmares become worse we can restart the prazosin.  He will return to the office in 4 weeks.   MDD, Moderate  GAD  Chronic PTSD: -Increase Zoloft to 75 mg daily for depression, anxiety -Stop Prazosin   Collaboration of Care: Case Discussed with Supervising Physician Dr. Serafina Mitchell  Patient/Guardian was advised Release of Information must be obtained prior to any record release in order to collaborate their care with an outside provider. Patient/Guardian was advised if they have not already done so to contact the registration department to sign all necessary forms in order for [place] to release information regarding their care.   Consent: Patient/Guardian gives verbal consent for treatment and assignment of benefits for services provided during this visit. Patient/Guardian expressed understanding and  agreed to proceed.    Briant Cedar, MD 10/13/2021, 6:05 PM

## 2021-10-14 ENCOUNTER — Telehealth (HOSPITAL_COMMUNITY): Payer: Self-pay | Admitting: Licensed Clinical Social Worker

## 2021-10-14 NOTE — Telephone Encounter (Signed)
CSW consulted by [facility] staff to assist pt with SDOH needs identified during their appt with pt yesterday.  CSW attempted to call pt to discuss but unable to leave VM.  Sent text message informing pt who I was and requesting return call  Burna Sis, LCSW Clinical Social Worker [facility] Desk#: [phone] Cell#: [phone]

## 2021-10-17 ENCOUNTER — Telehealth (HOSPITAL_COMMUNITY): Payer: Self-pay | Admitting: Licensed Clinical Social Worker

## 2021-10-17 NOTE — Telephone Encounter (Signed)
CSW attempted to call pt to follow up regarding SDOH concerns expressed during [facility] visit.  Unable to reach or leave VM- will attempt contact again later this week  Burna Sis, LCSW Clinical Social Worker [facility] Desk#: [phone] Cell#: [phone]

## 2021-10-20 ENCOUNTER — Other Ambulatory Visit: Payer: Self-pay

## 2021-10-21 ENCOUNTER — Other Ambulatory Visit (INDEPENDENT_AMBULATORY_CARE_PROVIDER_SITE_OTHER): Payer: Self-pay

## 2021-10-21 ENCOUNTER — Other Ambulatory Visit: Payer: Self-pay

## 2021-10-21 ENCOUNTER — Other Ambulatory Visit (HOSPITAL_COMMUNITY): Payer: Self-pay | Admitting: Student in an Organized Health Care Education/Training Program

## 2021-10-21 ENCOUNTER — Other Ambulatory Visit (INDEPENDENT_AMBULATORY_CARE_PROVIDER_SITE_OTHER): Payer: Self-pay | Admitting: Physician Assistant

## 2021-10-21 ENCOUNTER — Other Ambulatory Visit: Payer: Self-pay | Admitting: Family Medicine

## 2021-10-21 DIAGNOSIS — F4312 Post-traumatic stress disorder, chronic: Secondary | ICD-10-CM

## 2021-10-21 MED ORDER — INSULIN LISPRO (1 UNIT DIAL) 100 UNIT/ML (KWIKPEN)
PEN_INJECTOR | SUBCUTANEOUS | 2 refills | Status: DC
  Filled 2021-10-21: qty 15, fill #0
  Filled 2021-11-30: qty 12, 33d supply, fill #0
  Filled 2021-12-30: qty 12, 33d supply, fill #1
  Filled 2022-02-03: qty 12, 33d supply, fill #2

## 2021-10-24 ENCOUNTER — Other Ambulatory Visit: Payer: Self-pay

## 2021-10-24 ENCOUNTER — Telehealth (HOSPITAL_COMMUNITY): Payer: Self-pay | Admitting: Licensed Clinical Social Worker

## 2021-10-24 NOTE — Telephone Encounter (Signed)
CSW attempted to call pt to discuss SDOH concerns- CSW unable to reach or leave VM.  Will continue to follow and assist as needed  Burna Sis, LCSW Clinical Social Worker [facility] Desk#: [phone] Cell#: [phone]

## 2021-10-24 NOTE — Telephone Encounter (Signed)
Received refill request from pharmacy for Prazosin.  Given patients issues with sleep (sleeping for 20 hours at a time) after starting Prazosin we are holding this medication at this time.  We will reassess restarting it at next appointment.  Orders- Prazosin was not sent and is stopped.   Arna Snipe MD Resident

## 2021-10-25 ENCOUNTER — Other Ambulatory Visit: Payer: Self-pay

## 2021-11-07 ENCOUNTER — Ambulatory Visit (INDEPENDENT_AMBULATORY_CARE_PROVIDER_SITE_OTHER): Payer: Self-pay | Admitting: Primary Care

## 2021-11-07 ENCOUNTER — Telehealth (HOSPITAL_COMMUNITY): Payer: Self-pay

## 2021-11-07 NOTE — Telephone Encounter (Signed)
Called and spoke to patient's wife to confirm/remind patient of their appointment at the [facility] on 725/23.   Patient reminded to bring all medications and/or complete list.  Confirmed patient has transportation. Gave directions, instructed to utilize valet parking.  Confirmed appointment prior to ending call.

## 2021-11-08 ENCOUNTER — Encounter (HOSPITAL_COMMUNITY): Payer: Self-pay

## 2021-11-08 ENCOUNTER — Encounter (HOSPITAL_COMMUNITY): Payer: No Payment, Other | Admitting: Student in an Organized Health Care Education/Training Program

## 2021-11-29 ENCOUNTER — Encounter (INDEPENDENT_AMBULATORY_CARE_PROVIDER_SITE_OTHER): Payer: Self-pay | Admitting: Primary Care

## 2021-11-29 ENCOUNTER — Telehealth (HOSPITAL_COMMUNITY): Payer: Self-pay

## 2021-11-29 ENCOUNTER — Other Ambulatory Visit: Payer: Self-pay

## 2021-11-29 ENCOUNTER — Ambulatory Visit (INDEPENDENT_AMBULATORY_CARE_PROVIDER_SITE_OTHER): Payer: Self-pay | Admitting: Primary Care

## 2021-11-29 VITALS — BP 106/71 | HR 87 | Temp 98.0°F | Ht 68.0 in | Wt 132.0 lb

## 2021-11-29 DIAGNOSIS — E1069 Type 1 diabetes mellitus with other specified complication: Secondary | ICD-10-CM

## 2021-11-29 LAB — POCT GLYCOSYLATED HEMOGLOBIN (HGB A1C): Hemoglobin A1C: 10 % — AB (ref 4.0–5.6)

## 2021-11-29 MED ORDER — BASAGLAR KWIKPEN 100 UNIT/ML ~~LOC~~ SOPN
20.0000 [IU] | PEN_INJECTOR | Freq: Two times a day (BID) | SUBCUTANEOUS | 3 refills | Status: DC
  Filled 2021-11-29: qty 9, 23d supply, fill #0
  Filled 2021-12-30: qty 9, 23d supply, fill #1
  Filled 2022-02-03: qty 9, 23d supply, fill #2
  Filled 2022-03-01: qty 9, 23d supply, fill #3

## 2021-11-29 NOTE — Progress Notes (Unsigned)
Subjective:  Patient ID: [name], male    DOB: 10/21/1975  Age: 46 y.o. MRN: [id number]  CC: Diabetes   HPI [place] presents for follow-up of diabetes. Patient does  check blood sugar at home  Compliant with meds - Yes Checking CBGs? Yes  Fasting avg -   Postprandial average -  Exercising regularly? - No Watching carbohydrate intake? - Yes Neuropathy ? - Yes Hypoglycemic events - No  - Recovers with :   Pertinent ROS:  Polyuria - Yes Polydipsia - Yes Vision problems - Yes  Medications as noted below. Taking them regularly without complication/adverse reaction being reported today.   History Hulbert has a past medical history of CKD (chronic kidney disease), stage II, Diabetes mellitus without complication (HCC), NICM (nonischemic cardiomyopathy) (HCC), and Polysubstance abuse (HCC).   He has a past surgical history that includes Dental surgery and RIGHT HEART CATH (N/A, 05/13/2021).   His family history includes Arthritis in his mother; Diabetes Mellitus II in his father.He reports that he has been smoking cigarettes. He has a 10.00 pack-year smoking history. He has never used smokeless tobacco. He reports current alcohol use of about 40.0 standard drinks of alcohol per week. He reports current drug use. Frequency: 5.00 times per week. Drugs: Marijuana and Cocaine.  Current Outpatient Medications on File Prior to Visit  Medication Sig Dispense Refill  . acetaminophen (TYLENOL) 325 MG tablet Take 650 mg by mouth every 6 (six) hours as needed for moderate pain.    Marland Kitchen aspirin EC 81 MG EC tablet Take 1 tablet (81 mg total) by mouth daily. Swallow whole. 30 tablet 0  . Aspirin-Acetaminophen-Caffeine (GOODY HEADACHE PO) Take 1 packet by mouth daily as needed (pain).    . Blood Glucose Monitoring Suppl (TRUE METRIX METER) w/Device KIT Use kit to check blood glucose up to four times daily as directed 1 kit 0  . carvedilol (COREG) 6.25 MG tablet Take 1 tablet (6.25 mg  total) by mouth 2 (two) times daily. 60 tablet 11  . fluticasone (FLONASE) 50 MCG/ACT nasal spray Place 2 sprays into both nostrils daily.    Marland Kitchen glucose blood (TRUE METRIX BLOOD GLUCOSE TEST) test strip Use as instructed 100 each 12  . Insulin Glargine (BASAGLAR KWIKPEN) 100 UNIT/ML Inject 16 Units into the skin 2 (two) times daily. 9 mL 3  . insulin lispro (HUMALOG KWIKPEN) 100 UNIT/ML KwikPen Inject 0-12 Units into the skin 3 (three) times daily with meals. Blood sugars 0-150 give 0 units of insulin, 201-250 give 4 units, 251-300 give 6 units, 301-350 give 8 units, 351-400 give 10 units,> 400 give 12 units and call M.D. 15 mL 2  . Insulin Pen Needle 31G X 8 MM MISC Use as directed 3 (three) times daily before meals. 100 each 11  . Insulin Syringe-Needle U-100 (TRUEPLUS INSULIN SYRINGE) 31G X 5/16" 0.3 ML MISC [place] as directed as needed. 100 each 0  . Ipratropium-Albuterol (COMBIVENT) 20-100 MCG/ACT AERS respimat Inhale 1 puff by mouth into the lungs every 6 hours as needed for wheezing. 4 g 0  . sacubitril-valsartan (ENTRESTO) 24-26 MG Take 1 tablet by mouth 2 (two) times daily. 180 tablet 3  . spironolactone (ALDACTONE) 25 MG tablet Take 1 tablet (25 mg total) by mouth daily. 45 tablet 3  . TRUEplus Lancets 28G MISC Use as directed up to 4 times daily. 100 each 0  . sertraline (ZOLOFT) 50 MG tablet Take 1.5 tablets (75 mg total) by mouth daily. 45 tablet 0  No current facility-administered medications on file prior to visit.    ROS Comprehensive ROS Pertinent positive and negative noted in HPI    Objective:  BP 106/71   Pulse 87   Temp 98 F (36.7 C) (Oral)   Ht $R'5\' 8"'Ap$  (1.727 m)   Wt 132 lb (59.9 kg)   SpO2 98%   BMI 20.07 kg/m   BP Readings from Last 3 Encounters:  11/29/21 106/71  09/07/21 115/78  08/09/21 138/72    Wt Readings from Last 3 Encounters:  11/29/21 132 lb (59.9 kg)  09/07/21 139 lb 9.6 oz (63.3 kg)  08/09/21 142 lb 9.6 oz (64.7 kg)    Physical Exam Vitals  reviewed.  Constitutional:      Appearance: Normal appearance.     Comments: Body mass index is 20.07 kg/m.   HENT:     Head: Normocephalic.     Right Ear: Tympanic membrane and external ear normal.     Left Ear: Tympanic membrane and external ear normal.     Nose: Nose normal.  Eyes:     Extraocular Movements: Extraocular movements intact.     Pupils: Pupils are equal, round, and reactive to light.  Cardiovascular:     Rate and Rhythm: Normal rate and regular rhythm.  Pulmonary:     Effort: Pulmonary effort is normal.     Breath sounds: Normal breath sounds.  Abdominal:     General: Abdomen is flat. Bowel sounds are normal.     Palpations: Abdomen is soft.  Musculoskeletal:        General: Normal range of motion.     Cervical back: Normal range of motion.  Skin:    General: Skin is warm and dry.  Neurological:     Mental Status: He is alert and oriented to person, place, and time.  Psychiatric:        Mood and Affect: Mood normal.        Behavior: Behavior normal.        Thought Content: Thought content normal.   Lab Results  Component Value Date   HGBA1C 10.0 (A) 11/29/2021   HGBA1C 8.8 (A) 09/07/2021   HGBA1C 8.8 (A) 06/01/2021    Lab Results  Component Value Date   WBC 7.7 05/05/2021   HGB 12.2 (L) 05/13/2021   HCT 36.0 (L) 05/13/2021   PLT 311 05/05/2021   GLUCOSE 136 (H) 08/09/2021   CHOL 101 12/06/2020   TRIG 34 12/06/2020   HDL 62 12/06/2020   LDLCALC 32 12/06/2020   ALT 40 01/24/2021   AST 34 01/24/2021   NA 136 08/09/2021   K 4.6 08/09/2021   CL 104 08/09/2021   CREATININE 0.97 08/09/2021   BUN 10 08/09/2021   CO2 28 08/09/2021   INR 1.0 12/04/2020   HGBA1C 10.0 (A) 11/29/2021   MICROALBUR 0.58 06/17/2009     Assessment & Plan:   Demetrick was seen today for diabetes.  Diagnoses and all orders for this visit:  Type 1 diabetes mellitus with other specified complication (HCC) -     HgB A1c 10 previously 8.8    I am having Emilo  Olguin maintain his True Metrix Blood Glucose Test, aspirin EC, Ipratropium-Albuterol, carvedilol, acetaminophen, fluticasone, Aspirin-Acetaminophen-Caffeine (GOODY HEADACHE PO), Insulin Syringe-Needle U-100, Insulin Pen Needle, Entresto, True Metrix Meter, TRUEplus Lancets 28G, spironolactone, Basaglar KwikPen, sertraline, and insulin lispro.  No orders of the defined types were placed in this encounter.    Follow-up:   No follow-ups on file.  The above assessment and management plan was discussed with the patient. The patient verbalized understanding of and has agreed to the management plan. Patient is aware to call the clinic if symptoms fail to improve or worsen. Patient is aware when to return to the clinic for a follow-up visit. Patient educated on when it is appropriate to go to the emergency department.   Juluis Mire, NP-C

## 2021-11-29 NOTE — Patient Instructions (Signed)
Type 1 Diabetes Mellitus, Diagnosis, Adult  Type 1 diabetes (type 1 diabetes mellitus) is a long-term disease. It happens when the pancreas does not make enough of a hormone called insulin. This hormone lets sugars (glucose) get into cells in the body. The sugars give the body energy. If the body does not make enough insulin, sugars cannot get into cells. This causes high blood sugar (hyperglycemia). There is no cure for this disease right now, but you can treat it with insulin and lifestyle changes. What are the causes? The exact cause of type 1 diabetes is not known. What increases the risk? You may be more likely to develop this condition if: Someone in your family has type 1 diabetes. Your body's disease-fighting system (immune system) attacks itself. You are around some kinds of germs. There is cold weather where you live. What are the signs or symptoms? You may get symptoms slowly over days or weeks, or you may get them all of a sudden. Symptoms may include: More thirst or hunger than normal. Peeing more often than normal. Peeing more often at night. Sudden weight change. Weight change that you cannot explain. Feeling tired or weak. Seeing things blurry. How is this treated? This condition may be treated by a diabetes expert. You can help care for your condition by following instructions from your doctor. You may need to: Take insulin daily. Take medicines to help you avoid health problems that may be caused by your condition. Check your blood sugar as often as told. Change your diet. You may need an eating plan made just for you by a food expert (dietitian). Get regular exercise. Find ways to lower stress. Your doctor will set treatment goals for you. These will be based on your age, other health problems you have, and how much your diabetes treatment helps you. You should try to keep your blood sugar at these levels: Before meals: 80-130 mg/dL (4.4-7.2 mmol/L). After meals:  below 180 mg/dL (10 mmol/L). A1C (hemoglobin A1C)level: less than 7%. Follow these instructions at home: Questions to ask your doctor Do I need to meet with a diabetes educator? Should I join a support group for people with diabetes? What equipment will I need to care for myself at home? What medicines do I need? When should I take them? How often do I need to check my blood sugar? What number can I call if I have questions? When is my next doctor visit? General instructions Take over-the-counter and prescription medicines only as told by your doctor. Keep all follow-up visits as told by your doctor. This is important. Where to find more information American Diabetes Association (ADA): diabetes.org Association of Diabetes Care and Education Specialists (ADCES): diabeteseducator.org International Diabetes Federation (IDF): https://www.munoz-bell.org/ Contact a doctor if: Your blood sugar is higher than 240 mg/dL (13.3 mmol/L) for 2 days in a row. You have been sick for 2 days or more, and you are not getting better. You have had a fever for 2 days or more, and you are not getting better. You have any of these problems for more than 6 hours: You cannot eat or drink. You feel like you may vomit. You vomit. You have watery poop. Get help right away if: Your blood sugar is below 54 mg/dL (3 mmol/L). You get mixed up (confused). You cannot think clearly. You have trouble breathing. These symptoms may be an emergency. Do not wait to see if the symptoms will go away. Get medical help right away. Call your local  emergency services (911 in the [place] Do not drive yourself to the hospital. Summary Type 1 diabetes is a long-term disease. It happens when your pancreas does not make enough of a hormone called insulin. The exact cause of this condition is not known. There is no cure for it right now. This condition may be treatedwith insulin and other medicines. You may also treat it with diet or lifestyle  changes. Your doctor will set treatment goals for you. These will be based on your age, other health problems, and how much your treatment helps you. This information is not intended to replace advice given to you by your health care provider. Make sure you discuss any questions you have with your health care provider. Document Revised: 05/22/2019 Document Reviewed: 05/22/2019 Elsevier Patient Education  2023 [employer].

## 2021-11-29 NOTE — Telephone Encounter (Signed)
Called and spoke to patient's wife and she going to relate the message to confirm/remind patient of their appointment [facility] on 8//16/232.   Patient reminded to bring all medications and/or complete list.  Confirmed patient has transportation. Gave directions, instructed to utilize valet parking.  Confirmed appointment prior to ending call.

## 2021-11-30 ENCOUNTER — Other Ambulatory Visit (HOSPITAL_COMMUNITY): Payer: Self-pay

## 2021-11-30 ENCOUNTER — Ambulatory Visit (HOSPITAL_COMMUNITY)
Admission: RE | Admit: 2021-11-30 | Discharge: 2021-11-30 | Disposition: A | Discharge status: Home / Self Care | Payer: Self-pay | Source: Ambulatory Visit | Attending: Family Medicine | Admitting: Family Medicine

## 2021-11-30 ENCOUNTER — Other Ambulatory Visit (INDEPENDENT_AMBULATORY_CARE_PROVIDER_SITE_OTHER): Payer: Self-pay | Admitting: Physician Assistant

## 2021-11-30 ENCOUNTER — Other Ambulatory Visit (HOSPITAL_COMMUNITY): Payer: Self-pay | Admitting: Student in an Organized Health Care Education/Training Program

## 2021-11-30 ENCOUNTER — Encounter (HOSPITAL_COMMUNITY): Payer: Self-pay

## 2021-11-30 ENCOUNTER — Other Ambulatory Visit: Payer: Self-pay

## 2021-11-30 VITALS — BP 110/72 | HR 91 | Wt 133.6 lb

## 2021-11-30 DIAGNOSIS — I110 Hypertensive heart disease with heart failure: Secondary | ICD-10-CM | POA: Insufficient documentation

## 2021-11-30 DIAGNOSIS — I5022 Chronic systolic (congestive) heart failure: Secondary | ICD-10-CM | POA: Insufficient documentation

## 2021-11-30 DIAGNOSIS — Z794 Long term (current) use of insulin: Secondary | ICD-10-CM | POA: Insufficient documentation

## 2021-11-30 DIAGNOSIS — Q248 Other specified congenital malformations of heart: Secondary | ICD-10-CM

## 2021-11-30 DIAGNOSIS — E109 Type 1 diabetes mellitus without complications: Secondary | ICD-10-CM | POA: Insufficient documentation

## 2021-11-30 DIAGNOSIS — F411 Generalized anxiety disorder: Secondary | ICD-10-CM

## 2021-11-30 DIAGNOSIS — E108 Type 1 diabetes mellitus with unspecified complications: Secondary | ICD-10-CM

## 2021-11-30 DIAGNOSIS — F4312 Post-traumatic stress disorder, chronic: Secondary | ICD-10-CM

## 2021-11-30 DIAGNOSIS — Z79899 Other long term (current) drug therapy: Secondary | ICD-10-CM | POA: Insufficient documentation

## 2021-11-30 DIAGNOSIS — R5383 Other fatigue: Secondary | ICD-10-CM

## 2021-11-30 DIAGNOSIS — I428 Other cardiomyopathies: Secondary | ICD-10-CM | POA: Insufficient documentation

## 2021-11-30 DIAGNOSIS — I480 Paroxysmal atrial fibrillation: Secondary | ICD-10-CM | POA: Insufficient documentation

## 2021-11-30 DIAGNOSIS — F17200 Nicotine dependence, unspecified, uncomplicated: Secondary | ICD-10-CM

## 2021-11-30 DIAGNOSIS — F331 Major depressive disorder, recurrent, moderate: Secondary | ICD-10-CM

## 2021-11-30 DIAGNOSIS — F17210 Nicotine dependence, cigarettes, uncomplicated: Secondary | ICD-10-CM | POA: Insufficient documentation

## 2021-11-30 DIAGNOSIS — I253 Aneurysm of heart: Secondary | ICD-10-CM

## 2021-11-30 LAB — BASIC METABOLIC PANEL
Anion gap: 8 (ref 5–15)
BUN: 24 mg/dL — ABNORMAL HIGH (ref 6–20)
CO2: 28 mmol/L (ref 22–32)
Calcium: 8.5 mg/dL — ABNORMAL LOW (ref 8.9–10.3)
Chloride: 102 mmol/L (ref 98–111)
Creatinine, Ser: 1.33 mg/dL — ABNORMAL HIGH (ref 0.61–1.24)
GFR, Estimated: >60 mL/min (ref >60)
Glucose, Bld: 213 mg/dL — ABNORMAL HIGH (ref 70–99)
Potassium: 4.2 mmol/L (ref 3.5–5.1)
Sodium: 138 mmol/L (ref 135–145)

## 2021-11-30 LAB — CBC
HCT: 38.7 % — ABNORMAL LOW (ref 39.0–52.0)
Hemoglobin: 13.4 g/dL (ref 13.0–17.0)
MCH: 31.5 pg (ref 26.0–34.0)
MCHC: 34.6 g/dL (ref 30.0–36.0)
MCV: 91.1 fL (ref 80.0–100.0)
Platelets: 241 10*3/uL (ref 150–400)
RBC: 4.25 MIL/uL (ref 4.22–5.81)
RDW: 13 % (ref 11.5–15.5)
WBC: 7.7 10*3/uL (ref 4.0–10.5)
nRBC: 0 % (ref 0.0–0.2)

## 2021-11-30 LAB — IRON AND TIBC
Iron: 52 ug/dL (ref 45–182)
Saturation Ratios: 18 % (ref 17.9–39.5)
TIBC: 287 ug/dL (ref 250–450)
UIBC: 235 ug/dL

## 2021-11-30 LAB — FERRITIN: Ferritin: 80 ng/mL (ref 24–336)

## 2021-11-30 MED ORDER — SPIRONOLACTONE 25 MG PO TABS
12.5000 mg | ORAL_TABLET | Freq: Every day | ORAL | 5 refills | Status: DC
  Filled 2021-11-30: qty 15, 30d supply, fill #0
  Filled 2021-12-30: qty 15, 30d supply, fill #1
  Filled 2022-02-03: qty 15, 30d supply, fill #2
  Filled 2022-03-01: qty 15, 30d supply, fill #3
  Filled 2022-05-19: qty 15, 30d supply, fill #4
  Filled 2022-06-30: qty 15, 30d supply, fill #5

## 2021-11-30 MED ORDER — ASPIRIN 81 MG PO TBEC
81.0000 mg | DELAYED_RELEASE_TABLET | Freq: Every day | ORAL | 5 refills | Status: AC
  Filled 2021-11-30: qty 30, 30d supply, fill #0
  Filled 2021-12-30 – 2022-05-19 (×4): qty 30, 30d supply, fill #1

## 2021-11-30 MED ORDER — CARVEDILOL 6.25 MG PO TABS
6.2500 mg | ORAL_TABLET | Freq: Two times a day (BID) | ORAL | 5 refills | Status: DC
  Filled 2021-11-30 (×2): qty 60, 30d supply, fill #0
  Filled 2022-02-03: qty 60, 30d supply, fill #1
  Filled 2022-03-01: qty 60, 30d supply, fill #2
  Filled 2022-05-19: qty 60, 30d supply, fill #3
  Filled 2022-06-30: qty 60, 30d supply, fill #4
  Filled 2022-07-26: qty 60, 30d supply, fill #5

## 2021-11-30 NOTE — Progress Notes (Signed)
PCP: Kerin Perna, NP Cardiology: Dr. Radford Pax HF Cardiology: Dr. Aundra Dubin  46 y.o. 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  Patient says that though he was very active when young (played football and baseball), he always felt like he tired out too easily. Over time, he has developed gradually worsening exertional dyspnea.  In 8/22, he was found unresponsive with opiates in his urine.  He says that he thought he was getting cocaine, but apparently someone gave him fentanyl.  He got Narcan and was briefly in atrial fibrillation.  No recurrent of AF since that time.  He was also found to have COVID-19 PNA during this admission. Echo in 8/22 showed EF 40-45%, apical aneurysm, septal hypokinesis. In 11/22, cardiac MRI was done.  This showed LV EF 40% with septal HK, small outpouching at LV apex consistent with congenital apical diverticulum (not pseudoaneurysm, no LGE to suggest aneurysm related to MI), septal mid-wall LGE, RV EF 55% with moderate RV dilation, dilated coronary sinus. Given concern for unroofed coronary sinus, coronary CTA was done in 1/23.  This showed no significant CAD, apical aneurysm, and unroofed coronary sinus with evidence for significant left to right shunt.   RHC was done in 1/23.  PA pressure was normal, there was evidence for left => right atrial level shunt with Qp/Qs 2.31.   Follow up 4/23, continued NYHA III felt to be due in part to L>R shunting. Referred to [facility] adult congenital heart program for evaluation. Seen at [facility] and felt ASD not large enough to cause symptoms, planning to have team review all imaging and follow back in a few months. Further recommended a sleep study and heart monitor placed to quantity any arrhythmias, which showed mostly NSR, rare PAC/PVCs.  Today he returns for HF follow up. Overall feeling poorly.  He is SOB with minimal activity. Feels generally fatigued. Off Entresto and spiro, says [facility] told him not to take these. Denies palpitations, CP, dizziness, edema, or PND/Orthopnea. Appetite ok. No fever or chills. Weight at home 133 pounds.  Smokes 1/2 ppd, no ETOH. Last used cocaine a couple of weeks ago. Smokes THC daily. Uninsured, wants to work but does not feel up to it. Lives with wife.  Labs (1/23): BNP 21, hgb 13.6, K 4.9, creatinine 1.06 Labs (2/23): K 4.3, creatinine 0.96, BNP 25 Labs (4/23): K 4.6, creatinine 0.97  PMH: 1. Type 1 diabetes: Since age 69 2. HTN 3. Depression 4. H/o COVID-19 PNA 5. Active smoker 6. Prior cocaine 7. Atrial fibrillation: Paroxysmal, only noted brief run with respiratory arrest in 8/22.  8. Cardiomyopathy: Echo (8/22) with EF 40-45%, apical aneurysm, septal hypokinesis.  - Coronary CTA (1/23): Calcium score 0, no significant CAD, unroofed coronary since noted without persistent left SVC.  - Cardiac MRI (11/22): LV EF 40% with septal HK, small outpouching at LV apex consistent with congenital apical diverticulum (not pseudoaneurysm, no LGE to suggest aneurysm related to MI), septal mid-wall LGE, RV EF 55% with moderate RV dilation, dilated coronary sinus.  9. Congenital apical diverticulum: No LGE at the apex on 11/22 cMRI, so unlikely to be infarct-related aneurysm, and not consistent with pseudoaneurysm.  10. Unroofed coronary sinus: Confirmed by coronary CTA in 1/23. No associated persistent left SVC.  - RHC (1/23): mean RA 6, PA 24/10 mean 17, mean PCWP 9, CI 4.79, step up in oxygen saturation from SVC =>  RA with Qp/Qs 2.31.   SH: Married, used to work at [employer] but now out of work.  Smokes < 1 ppd. Uses marijuana.  Prior cocaine.   Family History  Problem Relation Age of Onset   Arthritis Mother    Diabetes Mellitus II Father    ROS: All systems reviewed and negative except as per HPI.   Current Outpatient Medications  Medication  Sig Dispense Refill   acetaminophen (TYLENOL) 325 MG tablet Take 650 mg by mouth every 6 (six) hours as needed for moderate pain.     aspirin EC 81 MG EC tablet Take 1 tablet (81 mg total) by mouth daily. Swallow whole. 30 tablet 0   Blood Glucose Monitoring Suppl (TRUE METRIX METER) w/Device KIT Use kit to check blood glucose up to four times daily as directed 1 kit 0   carvedilol (COREG) 6.25 MG tablet Take 1 tablet (6.25 mg total) by mouth 2 (two) times daily. 60 tablet 11   fluticasone (FLONASE) 50 MCG/ACT nasal spray Place 2 sprays into both nostrils daily.     glucose blood (TRUE METRIX BLOOD GLUCOSE TEST) test strip Use as instructed 100 each 12   Insulin Glargine (BASAGLAR KWIKPEN) 100 UNIT/ML Inject 20 Units into the skin 2 (two) times daily. 9 mL 3   insulin lispro (HUMALOG KWIKPEN) 100 UNIT/ML KwikPen Inject 0-12 Units into the skin 3 (three) times daily with meals. Blood sugars 0-150 give 0 units of insulin, 201-250 give 4 units, 251-300 give 6 units, 301-350 give 8 units, 351-400 give 10 units,> 400 give 12 units and call M.D. 15 mL 2   Insulin Pen Needle 31G X 8 MM MISC Use as directed 3 (three) times daily before meals. 100 each 11   Insulin Syringe-Needle U-100 (TRUEPLUS INSULIN SYRINGE) 31G X 5/16" 0.3 ML MISC [place] as directed as needed. 100 each 0   Ipratropium-Albuterol (COMBIVENT) 20-100 MCG/ACT AERS respimat Inhale 1 puff by mouth into the lungs every 6 hours as needed for wheezing. 4 g 0   sertraline (ZOLOFT) 50 MG tablet Take 1.5 tablets (75 mg total) by mouth daily. 45 tablet 0   TRUEplus Lancets 28G MISC Use as directed up to 4 times daily. 100 each 0   No current facility-administered medications for this encounter.   Wt Readings from Last 3 Encounters:  11/30/21 60.6 kg (133 lb 9.6 oz)  11/29/21 59.9 kg (132 lb)  09/07/21 63.3 kg (139 lb 9.6 oz)   BP 110/72   Pulse 91   Wt 60.6 kg (133 lb 9.6 oz)   SpO2 99%   BMI 20.31 kg/m  Physical Exam General:  NAD. No resp  difficulty, fatigued-appearing HEENT: Normal Neck: Supple. No JVD. Carotids 2+ bilat; no bruits. No lymphadenopathy or thryomegaly appreciated. Cor: PMI nondisplaced. Regular rate & rhythm. No rubs, gallops or murmurs. Lungs: Clear Abdomen: Soft, nontender, nondistended. No hepatosplenomegaly. No bruits or masses. Good bowel sounds. Extremities: No cyanosis, clubbing, rash, edema Neuro: Alert & oriented x 3, cranial nerves grossly intact. Moves all 4 extremities w/o difficulty. Affect pleasant.  Assessment/Plan: 1. Unroofed coronary sinus: There was concern for this on 11/22 cMRI, it was confirmed by coronary CTA.  There appears to be significant left to right shunting on CT. There was not an associated persistent left SVC.  The RV had normal systolic function but was moderately dilated on cMRI. RHC showed normal PA pressure and left => right shunt at atrial level with significant Qp/Qs 2.31.  I  suspect that some of his symptomatology is related to his left to right shunting with CHF though filling pressures not high on RHC.  - With high Qp/Qs, I think that he will need surgical correction of the unroofed coronary sinus due to left to right shunting. He was referred to [facility] congenital heart program for surgical evaluation. They are reviewing imaging and he has follow up appt in October at [facility] clinic. Per chart review, felt shunting not large enough to cause symptoms. 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 No CAD on coronary CTA and no MI-pattern LGE at the apex.  It does not appear to be a pseudoaneurysm. Congenital LV apical diverticulum is often associated with other abnormalities (in this case, unroofed CS).   3. Chronic systolic CHF: Echo with EF 40-45% in 8/22, cMRI with LV EF 40% in 11/22.  There was septal mid-wall LGE.  No CAD on coronary CTA.  Nonischemic cardiomyopathy, uncertain etiology.  Possible prior myocarditis versus sarcoidosis.  No evidence for  pulmonary sarcoidosis on 1/23 coronary CTA. He is not volume overloaded on exam.  NYHA class III symptoms, likely related to L>R shunting primarily.  - Continue Coreg 6.25 mg bid.  - Restart spiro 12.5 mg daily. BMET today, repeat in 1 week.  - Add Entresto 24/26 bid next. - No SGLT2 inhibitor with Type 1 DM.  4. Type 1 diabetes: Per PCP.  5. Smoking: Discussed stopping. 6. Fatigue: Likely multifactorial. Check TSH and iron studies. - Needs sleep study when he has insurance. Engage HFSW.  Follow up 3-4 weeks with PharmD (add back Entresto) and 3-4 months with Dr. Aundra Dubin.  Maricela Bo [facility] FNP-BC 11/30/2021

## 2021-11-30 NOTE — Progress Notes (Signed)
Heart and Vascular Care Navigation  11/30/2021  [name] 11-04-1975 [id number]  Reason for Referral: lack of insurance   Engaged with patient face to face for follow up visit for Heart and Vascular Care Coordination.                                                                                                   Assessment:  CSW consulted to check in with patient about lack of insurance.  Pt states he is pretty sure his wife has started Medicaid and disability applications again online for him but he was denied last year for both (hasn't worked in over a year).    Still feels unable to return to work though per APP would not meet criteria for HF- might be able through mental health concerns.  Provided CAFA (has already been provided multiple itmes) he will plan to give to wife to help him follow up.                                      HRT/VAS Care Coordination     Patients Home Cardiology Office [facility]   Outpatient Care Team Social Worker   Social Worker Name: Rosetta Posner, [facility], [phone]   Living arrangements for the past 2 months Single Family Home   Lives with: Spouse   Patient Current Insurance Coverage Self-Pay   Patient Has Concern With Paying Medical Bills Yes   Medical Bill Referrals: CAFA   Does Patient Have Prescription Coverage? No   Patient Prescription Assistance Programs Heart Failure Fund   Home Assistive Devices/Equipment Eyeglasses; CBG Meter       Social History:                                                                             SDOH Screenings   Alcohol Screen: Not on file  Depression (PHQ2-9): High Risk (09/07/2021)   Depression (PHQ2-9)    PHQ-2 Score: 22  Financial Resource Strain: High Risk (11/30/2021)   Overall Financial Resource Strain (CARDIA)    Difficulty of Paying Living Expenses: Very hard  Food Insecurity: No Food Insecurity (11/30/2021)   Hunger Vital Sign    Worried About Running Out of Food  in the Last Year: Never true    Ran Out of Food in the Last Year: Never true  Housing: Low Risk  (11/30/2021)   Housing    Last Housing Risk Score: 0  Physical Activity: Not on file  Social Connections: Not on file  Stress: Not on file  Tobacco Use: High Risk (11/30/2021)   Patient History    Smoking Tobacco Use: Every Day    Smokeless Tobacco Use: Never    Passive Exposure:  Not on file  Transportation Needs: No Transportation Needs (11/30/2021)   PRAPARE - Administrator, Civil Service (Medical): No    Lack of Transportation (Non-Medical): No    SDOH Interventions: Financial Resources:  Financial Strain Interventions: Other (Comment) (CAFA, patient care fund) Editor, commissioning for [employer] Program  Food Insecurity:  Food Insecurity Interventions:  (pending SNAP renewel)  Housing Insecurity:  Housing Interventions: Intervention Not Indicated  Transportation:   Transportation Interventions: Intervention Not Indicated   Follow-up plan:    Pt to keep [place] updated regarding any progress with above- confirms he has my number if he has questions or concerns  Burna Sis, LCSW Clinical Social Worker [facility] Desk#: [phone] Cell#: [phone]

## 2021-11-30 NOTE — Patient Instructions (Addendum)
Thank you for coming in today  Labs were done today, if any labs are abnormal the clinic will call you No news is good news  RESTART Spironolactone 12.5 mg 1/2 tablet daily   Your physician recommends that you return for lab work in:  1 week BMET  Your physician recommends that you schedule a follow-up appointment in:  3-4 weeks Pharmacy 3-4 months with Dr. Shirlee Latch    Do the following things EVERYDAY: Weigh yourself in the morning before breakfast. Write it down and keep it in a log. Take your medicines as prescribed Eat low salt foods--Limit salt (sodium) to 2000 mg per day.  Stay as active as you can everyday Limit all fluids for the day to less than 2 liters  At the [facility], you and your health needs are our priority. As part of our continuing mission to provide you with exceptional heart care, we have created designated Provider Care Teams. These Care Teams include your primary Cardiologist (physician) and Advanced Practice Providers (APPs- Physician Assistants and Nurse Practitioners) who all work together to provide you with the care you need, when you need it.   You may see any of the following providers on your designated Care Team at your next follow up: Dr Arvilla Meres Dr Carron Curie, NP Robbie Lis, [place] [facility], [place] Karle Plumber, PharmD   Please be sure to bring in all your medications bottles to every appointment.  If you have any questions or concerns before your next appointment please send [place] a message through [place] or call our office at [phone].    TO LEAVE A MESSAGE FOR THE NURSE SELECT OPTION 2, PLEASE LEAVE A MESSAGE INCLUDING: YOUR NAME DATE OF BIRTH CALL BACK NUMBER REASON FOR CALL**this is important as we prioritize the call backs  YOU WILL RECEIVE A CALL BACK THE SAME DAY AS LONG AS YOU CALL BEFORE 4:00 PM

## 2021-12-01 ENCOUNTER — Other Ambulatory Visit: Payer: Self-pay

## 2021-12-05 ENCOUNTER — Other Ambulatory Visit: Payer: Self-pay

## 2021-12-05 MED ORDER — SERTRALINE HCL 50 MG PO TABS
75.0000 mg | ORAL_TABLET | Freq: Every day | ORAL | 0 refills | Status: DC
  Filled 2021-12-05 – 2021-12-30 (×2): qty 45, 30d supply, fill #0

## 2021-12-05 NOTE — Telephone Encounter (Signed)
Received request for refill of patient's Zoloft.  This was sent.   Sent: Zoloft 75 mg daily.  45 (50 mg) tablets with 0 refills.      Arna Snipe MD Resident

## 2021-12-07 ENCOUNTER — Other Ambulatory Visit (HOSPITAL_COMMUNITY): Payer: Self-pay

## 2021-12-08 ENCOUNTER — Encounter (HOSPITAL_COMMUNITY): Payer: Self-pay | Admitting: Cardiology

## 2021-12-12 ENCOUNTER — Other Ambulatory Visit: Payer: Self-pay

## 2021-12-13 ENCOUNTER — Other Ambulatory Visit: Payer: Self-pay

## 2021-12-13 ENCOUNTER — Other Ambulatory Visit (INDEPENDENT_AMBULATORY_CARE_PROVIDER_SITE_OTHER): Payer: Self-pay | Admitting: Primary Care

## 2021-12-13 NOTE — Telephone Encounter (Signed)
Medication Refill - Medication: glucose blood (TRUE METRIX BLOOD GLUCOSE TEST) test strip Patient doesn't have contact with previous prescrining dr, says has been getting this every month just not this time Has the patient contacted their pharmacy? yes (Agent: If no, request that the patient contact the pharmacy for the refill. If patient does not wish to contact the pharmacy document the reason why and proceed with request.) (Agent: If yes, when and what did the pharmacy advise?)contact pcp  Preferred Pharmacy (with phone number or street name):  [facility] Phone:  [phone]  Fax:  [phone]     Has the patient been seen for an appointment in the last year OR does the patient have an upcoming appointment? yes  Agent: Please be advised that RX refills may take up to 3 business days. We ask that you follow-up with your pharmacy.

## 2021-12-14 ENCOUNTER — Other Ambulatory Visit: Payer: Self-pay

## 2021-12-14 MED ORDER — TRUE METRIX BLOOD GLUCOSE TEST VI STRP
ORAL_STRIP | 12 refills | Status: DC
  Filled 2021-12-14: qty 100, 25d supply, fill #0
  Filled 2021-12-30 – 2022-02-03 (×3): qty 100, 25d supply, fill #1
  Filled 2022-03-01: qty 100, 25d supply, fill #2
  Filled 2022-05-19: qty 100, 25d supply, fill #3
  Filled 2022-06-30: qty 100, 25d supply, fill #4
  Filled 2022-07-26: qty 100, 25d supply, fill #5
  Filled 2022-09-27: qty 100, 25d supply, fill #6
  Filled 2022-11-02 – 2022-11-03 (×2): qty 100, 25d supply, fill #7

## 2021-12-14 NOTE — Telephone Encounter (Signed)
Requested medication (s) are due for refill today: yes  Requested medication (s) are on the active medication list: yes  Last refill:  11/23/20  Future visit scheduled: yes  Notes to clinic:  Unable to refill per protocol, last refill by another provider 11/23/20, routing for approval.     Requested Prescriptions  Pending Prescriptions Disp Refills   glucose blood (TRUE METRIX BLOOD GLUCOSE TEST) test strip 100 each 12    Sig: Use as instructed     Endocrinology: Diabetes - Testing Supplies Passed - 12/13/2021  5:04 PM      Passed - Valid encounter within last 12 months    Recent Outpatient Visits           2 weeks ago Type 1 diabetes mellitus with other specified complication (HCC)   [facility] Gwinda Passe P, NP   3 months ago Adjustment reaction with anxiety and depression   [facility] Gwinda Passe P, NP   4 months ago Adjustment reaction with anxiety and depression   [facility] Gwinda Passe P, NP   6 months ago Type 1 diabetes mellitus with other specified complication [facility])   [place] [facility] [place], Jeannett Senior L, RPH-CPP   6 months ago Type 1 diabetes mellitus with other specified complication [facility])   [facility] Grayce Sessions, NP       Future Appointments             In 2 weeks Lois Huxley, Cornelius Moras, RPH-CPP [facility]   In 2 months Randa Evens, Kinnie Scales, NP [facility]

## 2021-12-14 NOTE — Telephone Encounter (Signed)
Routed to PCP 

## 2021-12-16 ENCOUNTER — Other Ambulatory Visit: Payer: Self-pay

## 2021-12-30 ENCOUNTER — Other Ambulatory Visit: Payer: Self-pay

## 2022-01-02 ENCOUNTER — Ambulatory Visit: Payer: Self-pay | Admitting: Pharmacist

## 2022-01-02 ENCOUNTER — Inpatient Hospital Stay (HOSPITAL_COMMUNITY): Admission: RE | Admit: 2022-01-02 | Payer: Self-pay | Source: Ambulatory Visit

## 2022-01-02 NOTE — Progress Notes (Addendum)
Advanced Heart Failure Clinic Note   PCP: Kerin Perna, NP Cardiology: Dr. Radford Pax HF Cardiology: Dr. Aundra Dubin  HPI:  46 y.o. 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  Patient says that though he was very active when young (played football and baseball), he always felt like he tired out too easily. Over time, he has developed gradually worsening exertional dyspnea.  In 11/2020, he was found unresponsive with opiates in his urine.  He says that he thought he was getting cocaine, but apparently someone gave him fentanyl.  He got Narcan and was briefly in atrial fibrillation.  No recurrent of AF since that time.  He was also found to have COVID-19 PNA during this admission. Echo in 11/2020 showed EF 40-45%, apical aneurysm, septal hypokinesis. In 02/2021, cardiac MRI was done.  This showed LV EF 40% with septal HK, small outpouching at LV apex consistent with congenital apical diverticulum (not pseudoaneurysm, no LGE to suggest aneurysm related to MI), septal mid-wall LGE, RV EF 55% with moderate RV dilation, dilated coronary sinus. Given concern for unroofed coronary sinus, coronary CTA was done in 04/2021.  This showed no significant CAD, apical aneurysm, and unroofed coronary sinus with evidence for significant left to right shunt.    RHC was done in 04/2021.  PA pressure was normal, there was evidence for left => right atrial level shunt with Qp/Qs 2.31.    Follow up 07/2021, continued NYHA III felt to be due in part to L>R shunting. Referred to [facility] adult congenital heart program for evaluation. Seen at [facility] and felt ASD not large enough to cause symptoms, planning to have team review all imaging and follow back in a few months. Further recommended a sleep study and heart monitor placed to quantify any arrhythmias, which showed mostly NSR, rare  PAC/PVCs.   Returned to [facility] for HF follow up 11/30/21. Overall was feeling poorly. He noted SOB with minimal activity. Felt generally fatigued. Off Entresto and spironolactone, says [facility] told him not to take these. Denied palpitations, CP, dizziness, edema, or PND/Orthopnea. Appetite was ok. No fever or chills. Weight at home was 133 pounds.  Smokes 1/2 ppd, no ETOH. Last used cocaine a couple of weeks ago. Smokes THC daily. Uninsured, wants to work but does not feel up to it. Lives with wife.  Today he returns to HF clinic for pharmacist medication titration. At last visit with APP spironolactone 12.5 mg daily was initiated. Labs returned after patient visit and showed Scr bump up to 1.33. Patient was contacted to not start spironolactone; however, patient could not be reached.  During patient visit today, he states he did start spironolactone 12.5 mg daily. Main complaint is fatigue. He wants to work and do more things around the house and is frustrated that he gets fatigued so quickly. Gets SOB with minimal activity. He also reports episodes of dizziness after walking short distances. BP does not decrease during these episodes. BP in clinic 138/64. Weight is up 6 lbs from last clinic visit. He is trying to eat more even though his appetite is poor. Has also been eating lots of canned vegetables (makes sure to rinse before eating). No LEE, PND or orthopnea. Has upcoming appointments at [facility], sleep study in October and office visit in early November. Expressed food insecurity today, difficulty affording food as food stamps have been discontinued. HF  Social work was able to provide him with food bag today.     HF Medications: Carvedilol 6.25 mg BID Spironolactone 12.5 mg daily  Has the patient been experiencing any side effects to the medications prescribed?  no  Does the patient have any problems obtaining medications due to transportation or finances?   No insurance, get HF medications through  HF fund at [facility].   Understanding of regimen: good Understanding of indications: good Potential of compliance: good Patient understands to avoid NSAIDs. Patient understands to avoid decongestants.    Pertinent Lab Values: 11/30/21: Serum creatinine 1.33, BUN 24, Potassium 4.2, Sodium 138 BMET today pending  Vital Signs: Weight: 140 (last clinic weight: 133.6 lbs) Blood pressure: 138/64  Heart rate: 86   Assessment/Plan: 1. Unroofed coronary sinus: There was concern for this on 02/2021 cMRI, it was confirmed by coronary CTA.  There appears to be significant left to right shunting on CT. There was not an associated persistent left SVC.  The RV had normal systolic function but was moderately dilated on cMRI. RHC showed normal PA pressure and left => right shunt at atrial level with significant Qp/Qs 2.31.  Suspect that some of his symptomatology is related to his left to right shunting with CHF though filling pressures not high on RHC.  - With high Qp/Qs, he will likely need surgical correction of the unroofed coronary sinus due to left to right shunting. He was referred to [facility] adult congenital heart program for surgical evaluation. They are reviewing imaging and he has follow up appt in November at [facility] clinic. Per chart review, felt shunting not large enough to cause symptoms. 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 No CAD on coronary CTA and no MI-pattern LGE at the apex.  It does not appear to be a pseudoaneurysm. Congenital LV apical diverticulum is often associated with other abnormalities (in this case, unroofed CS).   3. Chronic systolic CHF: Echo with EF 40-45% in 11/2020, cMRI with LV EF 40% in 02/2021.  There was septal mid-wall LGE.  No CAD on coronary CTA.  Nonischemic cardiomyopathy, uncertain etiology.  Possible prior myocarditis versus sarcoidosis.  No evidence for pulmonary sarcoidosis on 04/2021 coronary CTA.  - NYHA class III symptoms, likely  related to L>R shunting primarily. Euvolemic in exam.  - BMET today pending. If labs return WNL, plan to start losartan.  - Continue carvedilol 6.25 mg BID.  - Continue spironolactone 12.5 mg daily.  - No SGLT2 inhibitor with Type 1 DM.  4. Type 1 diabetes: Per PCP.  5. Smoking: Discussed stopping. 6. Fatigue: Likely multifactorial.  - Sleep study at [facility] scheduled 01/2022  Follow up 2 months with Dr. Jonn Shingles, PharmD, BCPS, [facility], CPP Heart Failure Clinic Pharmacist [phone]

## 2022-01-05 ENCOUNTER — Other Ambulatory Visit: Payer: Self-pay

## 2022-01-09 ENCOUNTER — Ambulatory Visit (HOSPITAL_COMMUNITY)
Admission: RE | Admit: 2022-01-09 | Discharge: 2022-01-09 | Disposition: A | Discharge status: Home / Self Care | Payer: Self-pay | Source: Ambulatory Visit | Attending: Cardiology | Admitting: Cardiology

## 2022-01-09 ENCOUNTER — Other Ambulatory Visit (HOSPITAL_COMMUNITY): Payer: Self-pay

## 2022-01-09 ENCOUNTER — Telehealth (HOSPITAL_COMMUNITY): Payer: Self-pay | Admitting: Pharmacist

## 2022-01-09 VITALS — BP 138/64 | HR 86 | Wt 140.0 lb

## 2022-01-09 DIAGNOSIS — Z139 Encounter for screening, unspecified: Secondary | ICD-10-CM | POA: Insufficient documentation

## 2022-01-09 DIAGNOSIS — F1910 Other psychoactive substance abuse, uncomplicated: Secondary | ICD-10-CM | POA: Insufficient documentation

## 2022-01-09 DIAGNOSIS — I110 Hypertensive heart disease with heart failure: Secondary | ICD-10-CM | POA: Insufficient documentation

## 2022-01-09 DIAGNOSIS — R5383 Other fatigue: Secondary | ICD-10-CM | POA: Insufficient documentation

## 2022-01-09 DIAGNOSIS — E109 Type 1 diabetes mellitus without complications: Secondary | ICD-10-CM | POA: Insufficient documentation

## 2022-01-09 DIAGNOSIS — F17200 Nicotine dependence, unspecified, uncomplicated: Secondary | ICD-10-CM | POA: Insufficient documentation

## 2022-01-09 DIAGNOSIS — I728 Aneurysm of other specified arteries: Secondary | ICD-10-CM | POA: Insufficient documentation

## 2022-01-09 DIAGNOSIS — I428 Other cardiomyopathies: Secondary | ICD-10-CM | POA: Insufficient documentation

## 2022-01-09 DIAGNOSIS — I5022 Chronic systolic (congestive) heart failure: Secondary | ICD-10-CM | POA: Insufficient documentation

## 2022-01-09 LAB — BASIC METABOLIC PANEL
Anion gap: 7 (ref 5–15)
BUN: 11 mg/dL (ref 6–20)
CO2: 27 mmol/L (ref 22–32)
Calcium: 8.6 mg/dL — ABNORMAL LOW (ref 8.9–10.3)
Chloride: 105 mmol/L (ref 98–111)
Creatinine, Ser: 0.82 mg/dL (ref 0.61–1.24)
GFR, Estimated: >60 mL/min (ref >60)
Glucose, Bld: 171 mg/dL — ABNORMAL HIGH (ref 70–99)
Potassium: 4 mmol/L (ref 3.5–5.1)
Sodium: 139 mmol/L (ref 135–145)

## 2022-01-09 MED ORDER — LOSARTAN POTASSIUM 25 MG PO TABS
25.0000 mg | ORAL_TABLET | Freq: Every day | ORAL | 5 refills | Status: DC
  Filled 2022-01-09 – 2022-01-13 (×2): qty 30, 30d supply, fill #0

## 2022-01-09 NOTE — Patient Instructions (Signed)
It was a pleasure seeing you today!  MEDICATIONS: -No medication changes until labs return. If your labs look good today we will consider starting losartan. I will call you this afternoon with the lab results.  -Call if you have questions about your medications.  LABS: -We will call you if your labs need attention.  NEXT APPOINTMENT: Return to clinic in 2 months with Dr. Aundra Dubin.  In general, to take care of your heart failure: -Limit your fluid intake to 2 Liters (half-gallon) per day.   -Limit your salt intake to ideally 2-3 grams (2000-3000 mg) per day. -Weigh yourself daily and record, and bring that "weight diary" to your next appointment.  (Weight gain of 2-3 pounds in 1 day typically means fluid weight.) -The medications for your heart are to help your heart and help you live longer.   -Please contact us before stopping any of your heart medications.  Call the clinic at [phone] with questions or to reschedule future appointments.

## 2022-01-09 NOTE — Telephone Encounter (Signed)
Labs returned after patient visit today. K stable at 4.0 and Scr back to baseline at 0.82. Plan to start losartan 25 mg daily. Will send to [facility] and use HF fund. Patient expressed understanding.   Audry Riles, PharmD, BCPS, BCCP, CPP Heart Failure Clinic Pharmacist [phone]

## 2022-01-10 ENCOUNTER — Telehealth: Payer: Self-pay | Admitting: Licensed Clinical Social Worker

## 2022-01-10 NOTE — Telephone Encounter (Signed)
H&V Care Navigation CSW Progress Note  Clinical Social Worker contacted patient by phone to f/u on concerns noted during pharmacy appointment. I was able tor each him at [phone]. Introduced self, role, reason for call. Previously had spoken with pt before tx to AHF clinic. Pt shares that his SNAP benefits ended last month and he isnt sure why. Inquired if he had contacted DSS to see if they could explain why benefits had ended. He has not. I encouraged him to do so as it may be that he missed re-enrollment letter to continue benefits.   Pt still not working, when asked states he still hasnt filled out CAFA provided to him. LCSW encouraged him to complete it- he requests a new one b/c its "somewhere in his papers." He shares sometimes he has a hard time completing applications bc he cannot see with his current glasses. LCSW will mail a new one and [employer] and include an application for [facility] [place] so he can be seen for new eye appt.   Inquired about housing issues. Shares that it is hard to stay on top of bills but he isnt sure if they are behind on specific utilities or rent since his wife manages those bills. I inquired if pt interested in resources and he is amenable to housing/utility assistance information. Encouraged him to speak with wife and if there are any additional specific challenges we may be able to address further to contact either [place] or I.   I mailed new CAFA, [employer], [facility] application, [employer], Housing clinic at [facility].   Patient is participating in a Managed Medicaid Plan:  No, self pay only.   SDOH Screenings   Food Insecurity: Food Insecurity Present (01/09/2022)  Housing: Medium Risk (01/09/2022)  Transportation Needs: Unmet Transportation Needs (01/09/2022)  Depression (PHQ2-9): High Risk (09/07/2021)  Financial Resource Strain: High Risk (11/30/2021)  Tobacco Use: High Risk (11/30/2021)   Westley Hummer, MSW, [place]  [phone]- work cell phone (preferred) [phone]- desk phone

## 2022-01-11 ENCOUNTER — Other Ambulatory Visit: Payer: Self-pay

## 2022-01-13 ENCOUNTER — Other Ambulatory Visit (HOSPITAL_COMMUNITY): Payer: Self-pay

## 2022-01-13 ENCOUNTER — Other Ambulatory Visit: Payer: Self-pay

## 2022-01-16 ENCOUNTER — Other Ambulatory Visit: Payer: Self-pay

## 2022-02-03 ENCOUNTER — Other Ambulatory Visit (HOSPITAL_COMMUNITY): Payer: Self-pay | Admitting: Student in an Organized Health Care Education/Training Program

## 2022-02-03 ENCOUNTER — Other Ambulatory Visit: Payer: Self-pay

## 2022-02-03 DIAGNOSIS — F331 Major depressive disorder, recurrent, moderate: Secondary | ICD-10-CM

## 2022-02-03 DIAGNOSIS — F4312 Post-traumatic stress disorder, chronic: Secondary | ICD-10-CM

## 2022-02-03 DIAGNOSIS — F411 Generalized anxiety disorder: Secondary | ICD-10-CM

## 2022-02-05 MED ORDER — SERTRALINE HCL 50 MG PO TABS
75.0000 mg | ORAL_TABLET | Freq: Every day | ORAL | 0 refills | Status: DC
  Filled 2022-02-05 – 2022-03-01 (×2): qty 45, 30d supply, fill #0

## 2022-02-05 NOTE — Telephone Encounter (Signed)
Received request from pharmacy the patient needed a refill of his Zoloft.  This was sent in.    Sent: -Zoloft 75 mg daily.  45 (50 mg) tablets with 0 refills.    Fatima Sanger MD Resident

## 2022-02-06 ENCOUNTER — Other Ambulatory Visit: Payer: Self-pay

## 2022-02-13 ENCOUNTER — Other Ambulatory Visit: Payer: Self-pay

## 2022-03-01 ENCOUNTER — Encounter (HOSPITAL_COMMUNITY): Payer: Self-pay | Admitting: Cardiology

## 2022-03-01 ENCOUNTER — Other Ambulatory Visit (HOSPITAL_COMMUNITY): Payer: Self-pay

## 2022-03-01 ENCOUNTER — Ambulatory Visit (HOSPITAL_COMMUNITY)
Admission: RE | Admit: 2022-03-01 | Discharge: 2022-03-01 | Disposition: A | Discharge status: Home / Self Care | Payer: Self-pay | Source: Ambulatory Visit | Attending: Cardiology | Admitting: Cardiology

## 2022-03-01 ENCOUNTER — Encounter (INDEPENDENT_AMBULATORY_CARE_PROVIDER_SITE_OTHER): Payer: Self-pay | Admitting: Primary Care

## 2022-03-01 ENCOUNTER — Other Ambulatory Visit: Payer: Self-pay

## 2022-03-01 ENCOUNTER — Ambulatory Visit (INDEPENDENT_AMBULATORY_CARE_PROVIDER_SITE_OTHER): Payer: Self-pay | Admitting: Primary Care

## 2022-03-01 VITALS — BP 98/50 | HR 97 | Wt 134.0 lb

## 2022-03-01 VITALS — BP 107/77 | HR 95 | Resp 16 | Wt 133.0 lb

## 2022-03-01 DIAGNOSIS — J984 Other disorders of lung: Secondary | ICD-10-CM | POA: Insufficient documentation

## 2022-03-01 DIAGNOSIS — Q2113 Coronary sinus atrial septal defect: Secondary | ICD-10-CM | POA: Insufficient documentation

## 2022-03-01 DIAGNOSIS — Z8616 Personal history of COVID-19: Secondary | ICD-10-CM | POA: Insufficient documentation

## 2022-03-01 DIAGNOSIS — I728 Aneurysm of other specified arteries: Secondary | ICD-10-CM | POA: Insufficient documentation

## 2022-03-01 DIAGNOSIS — E109 Type 1 diabetes mellitus without complications: Secondary | ICD-10-CM | POA: Insufficient documentation

## 2022-03-01 DIAGNOSIS — F514 Sleep terrors [night terrors]: Secondary | ICD-10-CM | POA: Insufficient documentation

## 2022-03-01 DIAGNOSIS — R9431 Abnormal electrocardiogram [ECG] [EKG]: Secondary | ICD-10-CM | POA: Insufficient documentation

## 2022-03-01 DIAGNOSIS — I110 Hypertensive heart disease with heart failure: Secondary | ICD-10-CM | POA: Insufficient documentation

## 2022-03-01 DIAGNOSIS — Q248 Other specified congenital malformations of heart: Secondary | ICD-10-CM

## 2022-03-01 DIAGNOSIS — I5022 Chronic systolic (congestive) heart failure: Secondary | ICD-10-CM

## 2022-03-01 DIAGNOSIS — E1069 Type 1 diabetes mellitus with other specified complication: Secondary | ICD-10-CM

## 2022-03-01 DIAGNOSIS — G4753 Recurrent isolated sleep paralysis: Secondary | ICD-10-CM | POA: Insufficient documentation

## 2022-03-01 DIAGNOSIS — Z794 Long term (current) use of insulin: Secondary | ICD-10-CM | POA: Insufficient documentation

## 2022-03-01 DIAGNOSIS — Z23 Encounter for immunization: Secondary | ICD-10-CM

## 2022-03-01 DIAGNOSIS — I428 Other cardiomyopathies: Secondary | ICD-10-CM | POA: Insufficient documentation

## 2022-03-01 DIAGNOSIS — F1410 Cocaine abuse, uncomplicated: Secondary | ICD-10-CM | POA: Insufficient documentation

## 2022-03-01 DIAGNOSIS — Z79899 Other long term (current) drug therapy: Secondary | ICD-10-CM | POA: Insufficient documentation

## 2022-03-01 DIAGNOSIS — F17210 Nicotine dependence, cigarettes, uncomplicated: Secondary | ICD-10-CM | POA: Insufficient documentation

## 2022-03-01 DIAGNOSIS — R002 Palpitations: Secondary | ICD-10-CM | POA: Insufficient documentation

## 2022-03-01 DIAGNOSIS — Z7982 Long term (current) use of aspirin: Secondary | ICD-10-CM | POA: Insufficient documentation

## 2022-03-01 LAB — POCT GLYCOSYLATED HEMOGLOBIN (HGB A1C): HbA1c, POC (controlled diabetic range): 9.9 % — AB (ref 0.0–7.0)

## 2022-03-01 LAB — BASIC METABOLIC PANEL
Anion gap: 7 (ref 5–15)
BUN: 18 mg/dL (ref 6–20)
CO2: 26 mmol/L (ref 22–32)
Calcium: 9.1 mg/dL (ref 8.9–10.3)
Chloride: 105 mmol/L (ref 98–111)
Creatinine, Ser: 1.25 mg/dL — ABNORMAL HIGH (ref 0.61–1.24)
GFR, Estimated: >60 mL/min (ref >60)
Glucose, Bld: 193 mg/dL — ABNORMAL HIGH (ref 70–99)
Potassium: 4.3 mmol/L (ref 3.5–5.1)
Sodium: 138 mmol/L (ref 135–145)

## 2022-03-01 LAB — BRAIN NATRIURETIC PEPTIDE: B Natriuretic Peptide: 20.6 pg/mL (ref 0.0–100.0)

## 2022-03-01 MED ORDER — INSULIN LISPRO (1 UNIT DIAL) 100 UNIT/ML (KWIKPEN)
PEN_INJECTOR | SUBCUTANEOUS | 2 refills | Status: DC
  Filled 2022-03-01: qty 15, 41d supply, fill #0
  Filled 2022-04-20: qty 15, 41d supply, fill #1
  Filled 2022-06-30: qty 15, 41d supply, fill #2

## 2022-03-01 MED ORDER — BASAGLAR KWIKPEN 100 UNIT/ML ~~LOC~~ SOPN
20.0000 [IU] | PEN_INJECTOR | Freq: Two times a day (BID) | SUBCUTANEOUS | 3 refills | Status: DC
  Filled 2022-03-01: qty 9, 23d supply, fill #0
  Filled 2022-04-01: qty 9, 23d supply, fill #1
  Filled 2022-05-19: qty 9, 23d supply, fill #2
  Filled 2022-06-30: qty 9, 23d supply, fill #3

## 2022-03-01 MED ORDER — LOSARTAN POTASSIUM 25 MG PO TABS
25.0000 mg | ORAL_TABLET | Freq: Every day | ORAL | 3 refills | Status: DC
  Filled 2022-03-01: qty 30, 30d supply, fill #0
  Filled 2022-04-01: qty 30, 30d supply, fill #1

## 2022-03-01 NOTE — Progress Notes (Signed)
ReDS Vest / Clip - 03/01/22 1200       ReDS Vest / Clip   Station Marker C    Ruler Value 24    ReDS Value Range Low volume    ReDS Actual Value 32

## 2022-03-01 NOTE — Progress Notes (Addendum)
PCP: Kerin Perna, NP Cardiology: Dr. Radford Pax HF Cardiology: Dr. Aundra Dubin  46 y.o. 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  Patient says that though he was very active when young (played football and baseball), he always felt like he tired out too easily. Over time, he has developed gradually worsening exertional dyspnea.  In 8/22, he was found unresponsive with opiates in his urine.  He says that he thought he was getting cocaine, but apparently someone gave him fentanyl.  He got Narcan and was briefly in atrial fibrillation.  No recurrent of AF since that time.  He was also found to have COVID-19 PNA during this admission. Echo in 8/22 showed EF 40-45%, apical aneurysm, septal hypokinesis. In 11/22, cardiac MRI was done.  This showed LV EF 40% with septal HK, small outpouching at LV apex consistent with congenital apical diverticulum (not pseudoaneurysm, no LGE to suggest aneurysm related to MI), septal mid-wall LGE, RV EF 55% with moderate RV dilation, dilated coronary sinus. Given concern for unroofed coronary sinus, coronary CTA was done in 1/23.  This showed no significant CAD, apical aneurysm, and unroofed coronary sinus with evidence for significant left to right shunt.   RHC was done in 1/23.  PA pressure was normal but there was evidence for left => right atrial level shunt with Qp/Qs 2.31.   Holter in 7/23 showed rare PVCs/PACs, no atrial fibrillation.   Patient saw Dr. Corine Shelter at [facility] for consideration of treatment of unroofed coronary sinus.  He has a followup appt in December.    Patient continues to report significant dyspnea, unchanged.  He is now smoking < 1/2 ppd. He has no insurance and has not been able to get a sleep study but says that [place] is working to try to arrange.  He continues to have night terrors and episodes that sound  like sleep paralysis.  He is off losartan (ran out).  He gets tired easily and is short of breath with moderate activity.  Symptoms are out of proportion to his RHC. No ETOH.  He has used occasional cocaine but not frequent.   Labs (1/23): BNP 21, hgb 13.6, K 4.9, creatinine 1.06 Labs (2/23): K 4.3, creatinine 0.96, BNP 25 Labs (9/23): K 4, creatinine 0.82  ECG (personally): NSR with short PR, right axis deviation.   REDS clip 32%  PMH: 1. Type 1 diabetes: Since age 35 2. HTN 3. Depression 4. H/o COVID-19 PNA 5. Active smoker 6. Cocaine abuse 7. Atrial fibrillation: Paroxysmal, only noted brief run with respiratory arrest in 8/22.  8. Cardiomyopathy: Echo (8/22) with EF 40-45%, apical aneurysm, septal hypokinesis.  - Coronary CTA (1/23): Calcium score 0, no significant CAD, unroofed coronary since noted without persistent left SVC.  - Cardiac MRI (11/22): LV EF 40% with septal HK, small outpouching at LV apex consistent with congenital apical diverticulum (not pseudoaneurysm, no LGE to suggest aneurysm related to MI), septal mid-wall LGE, RV EF 55% with moderate RV dilation, dilated coronary sinus.  9. Congenital apical diverticulum: No LGE at the apex on 11/22 cMRI, so unlikely to be infarct-related aneurysm, and not consistent with pseudoaneurysm.  10. Unroofed coronary sinus: Confirmed by coronary CTA in 1/23. No associated persistent left SVC.  - RHC (1/23): mean RA 6, PA 24/10 mean 17, mean PCWP 9, CI 4.79, step up in oxygen saturation from SVC => RA with Qp/Qs  2.31.  11. Palpitations: Holter in 7/23 showed rare PVCs/PACs, no atrial fibrillation.  SH: Married, used to work at [employer] but now out of work.  Smokes < 1 ppd. Uses marijuana.  Prior cocaine.   Family History  Problem Relation Age of Onset   Arthritis Mother    Diabetes Mellitus II Father    ROS: All systems reviewed and negative except as per HPI.   Current Outpatient Medications  Medication Sig Dispense  Refill   acetaminophen (TYLENOL) 325 MG tablet Take 650 mg by mouth every 6 (six) hours as needed for moderate pain.     aspirin EC 81 MG tablet Take 1 tablet (81 mg total) by mouth daily. Swallow whole. 30 tablet 5   Blood Glucose Monitoring Suppl (TRUE METRIX METER) w/Device KIT Use kit to check blood glucose up to four times daily as directed 1 kit 0   carvedilol (COREG) 6.25 MG tablet Take 1 tablet (6.25 mg total) by mouth 2 (two) times daily. 60 tablet 5   fluticasone (FLONASE) 50 MCG/ACT nasal spray Place 2 sprays into both nostrils daily.     glucose blood (TRUE METRIX BLOOD GLUCOSE TEST) test strip Use as instructed 100 each 12   Insulin Pen Needle 31G X 8 MM MISC Use as directed 3 (three) times daily before meals. 100 each 11   Insulin Syringe-Needle U-100 (TRUEPLUS INSULIN SYRINGE) 31G X 5/16" 0.3 ML MISC [place] as directed as needed. 100 each 0   Ipratropium-Albuterol (COMBIVENT) 20-100 MCG/ACT AERS respimat Inhale 1 puff by mouth into the lungs every 6 hours as needed for wheezing. 4 g 0   sertraline (ZOLOFT) 50 MG tablet Take 1.5 tablets (75 mg total) by mouth daily. 45 tablet 0   spironolactone (ALDACTONE) 25 MG tablet Take 0.5 tablets (12.5 mg total) by mouth daily. 15 tablet 5   TRUEplus Lancets 28G MISC Use as directed up to 4 times daily. 100 each 0   Insulin Glargine (BASAGLAR KWIKPEN) 100 UNIT/ML Inject 20 Units into the skin 2 (two) times daily. 9 mL 3   insulin lispro (HUMALOG KWIKPEN) 100 UNIT/ML KwikPen Sliding scale inject 3 times daily with meals. Blood sugars 0-150 give 0 units,201-250:4 units,251-300:6 units,301-350: 8 units,351-400: 10 units,>400 12 units & call M.D. 15 mL 2   losartan (COZAAR) 25 MG tablet Take 1 tablet (25 mg total) by mouth daily. 90 tablet 3   No current facility-administered medications for this encounter.   BP (!) 98/50   Pulse 97   Wt 60.8 kg (134 lb)   SpO2 99%   BMI 20.37 kg/m  General: NAD Neck: No JVD, no thyromegaly or thyroid nodule.   Lungs: Clear to auscultation bilaterally with normal respiratory effort. CV: Nondisplaced PMI.  Heart regular S1/S2 with fixed S2 split, no S3/S4, no murmur.  No peripheral edema.  No carotid bruit.  Normal pedal pulses.  Abdomen: Soft, nontender, no hepatosplenomegaly, no distention.  Skin: Intact without lesions or rashes.  Neurologic: Alert and oriented x 3.  Psych: Normal affect. Extremities: No clubbing or cyanosis.  HEENT: Normal.   Assessment/Plan: 1. Unroofed coronary sinus: There was concern for this on 11/22 cMRI, it was confirmed by coronary CTA.  There appears to be significant left to right shunting on CT. There was not an associated persistent left SVC.  The RV had normal systolic function but was moderately dilated on cMRI. RHC showed surprisingly normal PA pressure but left => right shunt at atrial level with significant Qp/Qs  2.31.  I suspect that some of his symptomatology is related to his left to right shunting with CHF though filling pressures not high on RHC.  - With high Qp/Qs, I think that he will need surgical correction of the unroofed coronary sinus due to left to right shunting. I have referred to the [facility] adult congenital heart program for evaluation, he has been seen by Dr. Corine Shelter and has a followup there in December.  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 No CAD on coronary CTA and no MI-pattern LGE at the apex.  It does not appear to be a pseudoaneurysm. Congenital LV apical diverticulum is often associated with other abnormalities (in this case, unroofed CS).   3. Chronic systolic CHF: Echo with EF 40-45% in 8/22, cMRI with LV EF 40% in 11/22.  There was septal mid-wall LGE.  No CAD on coronary CTA.  Nonischemic cardiomyopathy, uncertain etiology.   NYHA class III symptoms but hard to determine the cause of the symptoms.  He is not volume overloaded today by exam or REDS clip and filling pressures by RHC were not significantly  elevated despite robust shunting by Qp/Qs. I am not sure of the etiology of his dyspnea though worry it could be related to his left=>right shunt.  - Continue Coreg 6.25 mg bid.  - Restart losartan 25 mg daily, he does not have BP room for Entresto.    - Continue spironolactone 12.5 mg daily.  BMET/BNP today.  - No SGLT2 inhibitor with Type 1 DM.  - I do not think that he would benefit from a diuretic.  - I will arrange for repeat echo.  4. Type 1 diabetes: Per PCP.  5. Smoking: Needs to quit.  - I will arrange for PFTs, query whether COPD plays a role in his dyspnea.  6. Cocaine abuse: Strongly urged him to avoid.   7. Palpitations: Holter in 7/23 showed rare PVCs and PACs, no atrial fibrillation.  8. Night terrors/sleep paralysis: He needs a sleep study, trying to arrange at [facility].   9. I will ask social worker to help him with getting Medicaid.   Followup 2 months with APP.   Loralie Champagne 03/01/2022

## 2022-03-01 NOTE — Patient Instructions (Addendum)
There has been no changes to your medications.  Labs done today, your results will be available in MyChart, we will contact you for abnormal readings.  Your physician has requested that you have an echocardiogram. Echocardiography is a painless test that uses sound waves to create images of your heart. It provides your doctor with information about the size and shape of your heart and how well your heart's chambers and valves are working. This procedure takes approximately one hour. There are no restrictions for this procedure. Please do NOT wear cologne, perfume, aftershave, or lotions (deodorant is allowed). Please arrive 15 minutes prior to your appointment time.  Your physician has recommended that you have a pulmonary function test. Pulmonary Function Tests are a group of tests that measure how well air moves in and out of your lungs. YOU WILL BE CALLED TO HAVE THIS TEST ARRANGED   Your physician recommends that you schedule a follow-up appointment in: 2 months  If you have any questions or concerns before your next appointment please send [place] a message through [place] or call our office at [phone].    TO LEAVE A MESSAGE FOR THE NURSE SELECT OPTION 2, PLEASE LEAVE A MESSAGE INCLUDING: YOUR NAME DATE OF BIRTH CALL BACK NUMBER REASON FOR CALL**this is important as we prioritize the call backs  YOU WILL RECEIVE A CALL BACK THE SAME DAY AS LONG AS YOU CALL BEFORE 4:00 PM  At the Advanced Heart Failure Clinic, you and your health needs are our priority. As part of our continuing mission to provide you with exceptional heart care, we have created designated Provider Care Teams. These Care Teams include your primary Cardiologist (physician) and Advanced Practice Providers (APPs- Physician Assistants and Nurse Practitioners) who all work together to provide you with the care you need, when you need it.   You may see any of the following providers on your designated Care Team at your next  follow up: Dr Arvilla Meres Dr Marca Ancona Dr. Marcos Eke, NP Robbie Lis, [place] [facility] [place], [place] Brynda Peon, NP Karle Plumber, PharmD   Please be sure to bring in all your medications bottles to every appointment.

## 2022-03-01 NOTE — Progress Notes (Signed)
[name], is a 46 y.o. male  [name]  TKW:[id number]  DOB - 1975-08-06  Chief Complaint  Patient presents with   Diabetes       Subjective:   [name] is a 47 y.o. male here today for a follow up visit for the management of type 1 diabetes.  Recently seen by clinical pharmacist medication was adjusted A1c today was 9.9.  He has polyuria diabetes /congestive heart failure with the use of diuretics.  He denies polydipsia or polyphasia.  He has been under a lot of stress seen by cardiologist and prescribed several medications and voiced the need of more testing done.  Patient does not have insurance or income to sustain or follow-up with testing.  Patient is to ask for a call and assistance paperwork filled out and return to cardiology or [facility] as soon as completed.  Patient has No headache, No chest pain, No abdominal pain - No Nausea, No new weakness tingling or numbness, No Cough - shortness of breath  No problems updated.  Allergies  Allergen Reactions   Shellfish Allergy Anaphylaxis    Past Medical History:  Diagnosis Date   CKD (chronic kidney disease), stage II    Diabetes mellitus without complication (HCC)    type 1   NICM (nonischemic cardiomyopathy) (HCC)    Coronary Ca score 0 and no CAD on coronary CTGA 04/2021   Polysubstance abuse ([place])     Current Outpatient Medications on File Prior to Visit  Medication Sig Dispense Refill   acetaminophen (TYLENOL) 325 MG tablet Take 650 mg by mouth every 6 (six) hours as needed for moderate pain.     aspirin EC 81 MG tablet Take 1 tablet (81 mg total) by mouth daily. Swallow whole. 30 tablet 5   Blood Glucose Monitoring Suppl (TRUE METRIX METER) w/Device KIT Use kit to check blood glucose up to four times daily as directed 1 kit 0   carvedilol (COREG) 6.25 MG tablet Take 1 tablet (6.25 mg total) by mouth 2 (two) times daily. 60 tablet 5   fluticasone (FLONASE) 50 MCG/ACT nasal  spray Place 2 sprays into both nostrils daily.     glucose blood (TRUE METRIX BLOOD GLUCOSE TEST) test strip Use as instructed 100 each 12   Insulin Pen Needle 31G X 8 MM MISC Use as directed 3 (three) times daily before meals. 100 each 11   Insulin Syringe-Needle U-100 (TRUEPLUS INSULIN SYRINGE) 31G X 5/16" 0.3 ML MISC [place] as directed as needed. 100 each 0   Ipratropium-Albuterol (COMBIVENT) 20-100 MCG/ACT AERS respimat Inhale 1 puff by mouth into the lungs every 6 hours as needed for wheezing. 4 g 0   sertraline (ZOLOFT) 50 MG tablet Take 1.5 tablets (75 mg total) by mouth daily. 45 tablet 0   spironolactone (ALDACTONE) 25 MG tablet Take 0.5 tablets (12.5 mg total) by mouth daily. 15 tablet 5   TRUEplus Lancets 28G MISC Use as directed up to 4 times daily. 100 each 0   No current facility-administered medications on file prior to visit.    Objective:   Vitals:   03/01/22 1609  BP: 107/77  Pulse: 95  Resp: 16  SpO2: 96%  Weight: 133 lb (60.3 kg)    Exam General appearance : Awake, alert, not in any distress. Speech Clear. Not toxic looking HEENT: Atraumatic and Normocephalic, pupils equally reactive to light and accomodation Neck: Supple, no JVD. No cervical lymphadenopathy.  Chest: Good air entry bilaterally, no  added sounds  CVS: S1 S2 regular, no murmurs.  Abdomen: Bowel sounds present, Non tender and not distended with no gaurding, rigidity or rebound. Extremities: B/L Lower Ext shows no edema, both legs are warm to touch Neurology: Awake alert, and oriented X 3, CN II-XII intact, Non focal Skin: No Rash  Data Review Lab Results  Component Value Date   HGBA1C 9.9 (A) 03/01/2022   HGBA1C 10.0 (A) 11/29/2021   HGBA1C 8.8 (A) 09/07/2021    Assessment & Plan   1. Type 1 diabetes mellitus with other specified complication (HCC) - POCT glycosylated hemoglobin (Hb A1C) - Microalbumin / creatinine urine ratio - insulin lispro (HUMALOG KWIKPEN) 100 UNIT/ML KwikPen; Sliding  scale inject 3 times daily with meals. Blood sugars 0-150 give 0 units,201-250:4 units,251-300:6 units,301-350: 8 units,351-400: 10 units,>400 12 units & call M.D.  Dispense: 15 mL; Refill: 2 - Insulin Glargine (BASAGLAR KWIKPEN) 100 UNIT/ML; Inject 20 Units into the skin 2 (two) times daily.  Dispense: 9 mL; Refill: 3  2.Need for immunization against influenza  Patient have been counseled extensively about nutrition and exercise. Other issues discussed during this visit include: low cholesterol diet, weight control and daily exercise, foot care, annual eye examinations at Ophthalmology, importance of adherence with medications and regular follow-up. We also discussed long term complications of uncontrolled diabetes and hypertension.   Return in about 3 months (around 06/01/2022) for DM.  The patient was given clear instructions to go to ER or return to medical center if symptoms don't improve, worsen or new problems develop. The patient verbalized understanding. The patient was told to call to get lab results if they haven't heard anything in the next week.   This note has been created with Surveyor, quantity. Any transcriptional errors are unintentional.   Kerin Perna, NP 03/06/2022, 4:39 PM

## 2022-03-02 ENCOUNTER — Encounter (HOSPITAL_COMMUNITY): Payer: Self-pay

## 2022-03-02 ENCOUNTER — Other Ambulatory Visit: Payer: Self-pay

## 2022-03-02 NOTE — Progress Notes (Signed)
CD of MRI and CTA mailed to Dr. Deatra James at [facility]

## 2022-03-03 ENCOUNTER — Telehealth (HOSPITAL_COMMUNITY): Payer: Self-pay | Admitting: Licensed Clinical Social Worker

## 2022-03-03 NOTE — Telephone Encounter (Signed)
H&V Care Navigation CSW Progress Note  Clinical Social Worker consulted to reach out to pt regarding lack of insurance.  CSW had discussed with pt multiple times in the past and does not appear to meet criteria for disability under HF.  Pt has applied for Medicaid in the last year and was denied.  Do no think Medicaid would be an option for patient at this time.  Pt wife has insurance through her job but per patient it is cost prohibitive to add him on the policy.  CSW discussed pt applying through [facility]- send brochure on how to apply and see if he can get subsidized insurance.  Pt also has been provided CAFA on multiple occasions to help with [facility] bills but has not turned in.  Pt states he has it filled out and will plan to bring into clinic.  Pt reports no other concerns at this time.    SDOH Screenings   Food Insecurity: Food Insecurity Present (01/09/2022)  Housing: Medium Risk (01/09/2022)  Transportation Needs: Unmet Transportation Needs (01/09/2022)  Depression (PHQ2-9): High Risk (03/01/2022)  Financial Resource Strain: High Risk (11/30/2021)  Tobacco Use: High Risk (03/01/2022)   Burna Sis, LCSW Clinical Social Worker [facility] Desk#: [phone] Cell#: [phone]

## 2022-03-10 ENCOUNTER — Other Ambulatory Visit: Payer: Self-pay

## 2022-03-20 ENCOUNTER — Ambulatory Visit (HOSPITAL_COMMUNITY)
Admission: RE | Admit: 2022-03-20 | Discharge: 2022-03-20 | Disposition: A | Discharge status: Home / Self Care | Payer: Self-pay | Source: Ambulatory Visit | Attending: Cardiology | Admitting: Cardiology

## 2022-03-20 ENCOUNTER — Ambulatory Visit (HOSPITAL_COMMUNITY)
Admission: RE | Admit: 2022-03-20 | Discharge: 2022-03-20 | Disposition: A | Discharge status: Home / Self Care | Payer: Self-pay | Source: Ambulatory Visit | Attending: Primary Care | Admitting: Primary Care

## 2022-03-20 DIAGNOSIS — E119 Type 2 diabetes mellitus without complications: Secondary | ICD-10-CM | POA: Insufficient documentation

## 2022-03-20 DIAGNOSIS — I110 Hypertensive heart disease with heart failure: Secondary | ICD-10-CM | POA: Insufficient documentation

## 2022-03-20 DIAGNOSIS — I5022 Chronic systolic (congestive) heart failure: Secondary | ICD-10-CM

## 2022-03-20 LAB — ECHOCARDIOGRAM COMPLETE
Area-P 1/2: 4.31 cm2
S' Lateral: 4 cm

## 2022-03-20 LAB — PULMONARY FUNCTION TEST
DL/VA % pred: 79 %
DL/VA: 3.61 ml/min/mmHg/L
DLCO unc % pred: 56 %
DLCO unc: 15.59 ml/min/mmHg
FEF 25-75 Post: 3.68 L/sec
FEF 25-75 Pre: 2.62 L/sec
FEF2575-%Change-Post: 40 %
FEF2575-%Pred-Post: 110 %
FEF2575-%Pred-Pre: 78 %
FEV1-%Change-Post: 7 %
FEV1-%Pred-Post: 89 %
FEV1-%Pred-Pre: 83 %
FEV1-Post: 3.3 L
FEV1-Pre: 3.08 L
FEV1FVC-%Change-Post: 5 %
FEV1FVC-%Pred-Pre: 99 %
FEV6-%Change-Post: 2 %
FEV6-%Pred-Post: 88 %
FEV6-%Pred-Pre: 86 %
FEV6-Post: 4.02 L
FEV6-Pre: 3.92 L
FEV6FVC-%Change-Post: 0 %
FEV6FVC-%Pred-Post: 103 %
FEV6FVC-%Pred-Pre: 103 %
FVC-%Change-Post: 1 %
FVC-%Pred-Post: 85 %
FVC-%Pred-Pre: 84 %
FVC-Post: 4.02 L
FVC-Pre: 3.97 L
Post FEV1/FVC ratio: 82 %
Post FEV6/FVC ratio: 100 %
Pre FEV1/FVC ratio: 78 %
Pre FEV6/FVC Ratio: 100 %
RV % pred: 92 %
RV: 1.72 L
TLC % pred: 87 %
TLC: 5.67 L

## 2022-03-20 MED ORDER — ALBUTEROL SULFATE (2.5 MG/3ML) 0.083% IN NEBU
2.5000 mg | INHALATION_SOLUTION | Freq: Once | RESPIRATORY_TRACT | Status: AC
  Administered 2022-03-20: 2.5 mg via RESPIRATORY_TRACT

## 2022-03-31 ENCOUNTER — Telehealth (HOSPITAL_COMMUNITY): Payer: Self-pay | Admitting: Licensed Clinical Social Worker

## 2022-03-31 NOTE — Telephone Encounter (Signed)
H&V Care Navigation CSW Progress Note  Clinical Social Worker received folder that pt dropped off containing applications for [employer], [employer], and [employer] assistance.  Applications were not complete and did not come with any supporting documentation so CSW unable to turn in for patient.  Attempted to call pt to discuss but unable to leave VM- sent text message informing pt of above and providing list of required documenths.   SDOH Screenings   Food Insecurity: Food Insecurity Present (01/09/2022)  Housing: Medium Risk (01/09/2022)  Transportation Needs: Unmet Transportation Needs (01/09/2022)  Depression (PHQ2-9): High Risk (03/01/2022)  Financial Resource Strain: High Risk (11/30/2021)  Tobacco Use: High Risk (03/01/2022)    Burna Sis, LCSW Clinical Social Worker [facility] Desk#: [phone] Cell#: [phone]

## 2022-04-01 ENCOUNTER — Other Ambulatory Visit (HOSPITAL_COMMUNITY): Payer: Self-pay | Admitting: Student in an Organized Health Care Education/Training Program

## 2022-04-01 DIAGNOSIS — F411 Generalized anxiety disorder: Secondary | ICD-10-CM

## 2022-04-01 DIAGNOSIS — F4312 Post-traumatic stress disorder, chronic: Secondary | ICD-10-CM

## 2022-04-01 DIAGNOSIS — F331 Major depressive disorder, recurrent, moderate: Secondary | ICD-10-CM

## 2022-04-03 ENCOUNTER — Other Ambulatory Visit: Payer: Self-pay

## 2022-04-04 ENCOUNTER — Other Ambulatory Visit: Payer: Self-pay

## 2022-04-04 MED ORDER — SERTRALINE HCL 50 MG PO TABS
75.0000 mg | ORAL_TABLET | Freq: Every day | ORAL | 0 refills | Status: AC
  Filled 2022-04-04 – 2022-06-30 (×3): qty 45, 30d supply, fill #0

## 2022-04-04 NOTE — Telephone Encounter (Signed)
Recevied request for a refill of patients Zoloft.  This will be filled, however, for further refills a follow up appointment must be made.   Sent: -Zoloft 75 mg daily.  45 (50 mg) tablets with 0 refills.    Arna Snipe MD Resident

## 2022-04-07 ENCOUNTER — Ambulatory Visit: Payer: Self-pay | Admitting: Pharmacist

## 2022-04-11 ENCOUNTER — Other Ambulatory Visit: Payer: Self-pay

## 2022-04-21 ENCOUNTER — Other Ambulatory Visit: Payer: Self-pay

## 2022-04-27 ENCOUNTER — Encounter (HOSPITAL_COMMUNITY): Payer: Self-pay

## 2022-04-28 NOTE — Progress Notes (Signed)
PCP: Kerin Perna, NP Cardiology: Dr. Radford Pax HF Cardiology: Dr. Aundra Dubin  47 y.o. 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  Patient says that though he was very active when young (played football and baseball), he always felt like he tired out too easily. Over time, he has developed gradually worsening exertional dyspnea.  In 8/22, he was found unresponsive with opiates in his urine.  He says that he thought he was getting cocaine, but apparently someone gave him fentanyl.  He got Narcan and was briefly in atrial fibrillation.  No recurrent of AF since that time.  He was also found to have COVID-19 PNA during this admission. Echo in 8/22 showed EF 40-45%, apical aneurysm, septal hypokinesis. In 11/22, cardiac MRI was done.  This showed LV EF 40% with septal HK, small outpouching at LV apex consistent with congenital apical diverticulum (not pseudoaneurysm, no LGE to suggest aneurysm related to MI), septal mid-wall LGE, RV EF 55% with moderate RV dilation, dilated coronary sinus. Given concern for unroofed coronary sinus, coronary CTA was done in 1/23.  This showed no significant CAD, apical aneurysm, and unroofed coronary sinus with evidence for significant left to right shunt.   RHC was done in 1/23. PA pressure was normal but there was evidence for left => right atrial level shunt with Qp/Qs 2.31.   Holter in 7/23 showed rare PVCs/PACs, no atrial fibrillation.   Patient saw Dr. Corine Shelter at [facility] for consideration of treatment of unroofed coronary sinus.  He has a followup appt in December.    Follow up 11/23, he continued to report significant dyspnea, unchanged. Symptoms are out of proportion to his RHC. No ETOH.  He has used occasional cocaine but not frequent. Repeat echo 12/23 showed EF similar to prior at 35-40%. PFTs 12/23 showed normal  spirometry, but decreased DLCO.  Today he returns for HF follow up. Overall feeling poorly. He is dizzy everytime he stands up and remains generally fatigued. He smokes 1-2 cigarettes/day. Back using cocaine more frequently. He is short of breath with minimal exertion. Denies palpitations, CP, abnormal bleeding, edema, or PND/Orthopnea. Appetite fair. No fever or chills. He does not weigh at home. Taking all medications. Uninsured, and has food insecurity. Does not have enough money for follow up at [facility] with Dr. Corine Shelter.  Labs (1/23): BNP 21, hgb 13.6, K 4.9, creatinine 1.06 Labs (2/23): K 4.3, creatinine 0.96, BNP 25 Labs (9/23): K 4, creatinine 0.82 Labs (11/23): K 4.3, creatinine 1.25  ECG (personally reviewed): none ordered today.  PMH: 1. Type 1 diabetes: Since age 52 2. HTN 3. Depression 4. H/o COVID-19 PNA 5. Active smoker - PFTs (12/23): normal spirometry but decreased DLCO. ? Pulmonary vascular issue. 6. Cocaine abuse 7. Atrial fibrillation: Paroxysmal, only noted brief run with respiratory arrest in 8/22.  8. Cardiomyopathy: Echo (8/22) with EF 40-45%, apical aneurysm, septal hypokinesis.  - Coronary CTA (1/23): Calcium score 0, no significant CAD, unroofed coronary since noted without persistent left SVC.  - Cardiac MRI (11/22): LV EF 40% with septal HK, small outpouching at LV apex consistent with congenital apical diverticulum (not pseudoaneurysm, no LGE to suggest aneurysm related to MI), septal mid-wall LGE, RV EF 55% with moderate RV dilation, dilated coronary sinus.  - Echo (12/23): EF 35-40%, normal RV 9. Congenital apical diverticulum: No LGE at the apex on 11/22 cMRI, so unlikely to be infarct-related  aneurysm, and not consistent with pseudoaneurysm.  10. Unroofed coronary sinus: Confirmed by coronary CTA in 1/23. No associated persistent left SVC.  - RHC (1/23): mean RA 6, PA 24/10 mean 17, mean PCWP 9, CI 4.79, step up in oxygen saturation from SVC => RA with Qp/Qs  2.31.  11. Palpitations: Holter in 7/23 showed rare PVCs/PACs, no atrial fibrillation.  SH: Married, used to work at [employer] but now out of work.  Smokes < 1 ppd. Uses marijuana.  Prior cocaine.   Family History  Problem Relation Age of Onset   Arthritis Mother    Diabetes Mellitus II Father    ROS: All systems reviewed and negative except as per HPI.   Current Outpatient Medications  Medication Sig Dispense Refill   acetaminophen (TYLENOL) 325 MG tablet Take 650 mg by mouth every 6 (six) hours as needed for moderate pain.     aspirin EC 81 MG tablet Take 1 tablet (81 mg total) by mouth daily. Swallow whole. 30 tablet 5   Blood Glucose Monitoring Suppl (TRUE METRIX METER) w/Device KIT Use kit to check blood glucose up to four times daily as directed 1 kit 0   carvedilol (COREG) 6.25 MG tablet Take 1 tablet (6.25 mg total) by mouth 2 (two) times daily. 60 tablet 5   fluticasone (FLONASE) 50 MCG/ACT nasal spray Place 2 sprays into both nostrils daily.     glucose blood (TRUE METRIX BLOOD GLUCOSE TEST) test strip Use as instructed 100 each 12   Insulin Glargine (BASAGLAR KWIKPEN) 100 UNIT/ML Inject 20 Units into the skin 2 (two) times daily. 9 mL 3   insulin lispro (HUMALOG KWIKPEN) 100 UNIT/ML KwikPen Sliding scale inject 3 times daily with meals. Blood sugars 0-150 give 0 units,201-250:4 units,251-300:6 units,301-350: 8 units,351-400: 10 units,>400 12 units & call M.D. 15 mL 2   Insulin Pen Needle 31G X 8 MM MISC Use as directed 3 (three) times daily before meals. 100 each 11   Insulin Syringe-Needle U-100 (TRUEPLUS INSULIN SYRINGE) 31G X 5/16" 0.3 ML MISC [place] as directed as needed. 100 each 0   Ipratropium-Albuterol (COMBIVENT) 20-100 MCG/ACT AERS respimat Inhale 1 puff by mouth into the lungs every 6 hours as needed for wheezing. 4 g 0   losartan (COZAAR) 25 MG tablet Take 1 tablet (25 mg total) by mouth daily. 90 tablet 3   sertraline (ZOLOFT) 50 MG tablet Take 1.5 tablets (75  mg total) by mouth daily. 45 tablet 0   spironolactone (ALDACTONE) 25 MG tablet Take 0.5 tablets (12.5 mg total) by mouth daily. 15 tablet 5   TRUEplus Lancets 28G MISC Use as directed up to 4 times daily. 100 each 0   No current facility-administered medications for this encounter.   Wt Readings from Last 3 Encounters:  05/02/22 59.4 kg (131 lb)  03/01/22 60.8 kg (134 lb)  03/01/22 60.3 kg (133 lb)   BP (!) 148/72   Pulse 87   Wt 59.4 kg (131 lb)   SpO2 100%   BMI 19.92 kg/m  Physical Exam General:  NAD. No resp difficulty, walked into clinic, thin HEENT: Normal Neck: Supple. No JVD. Carotids 2+ bilat; no bruits. No lymphadenopathy or thryomegaly appreciated. Cor: PMI nondisplaced. Regular rate & rhythm. No rubs, gallops or murmurs Lungs: Clear Abdomen: Soft, nontender, nondistended. No hepatosplenomegaly. No bruits or masses. Good bowel sounds. Extremities: No cyanosis, clubbing, rash, edema Neuro: Alert & oriented x 3, cranial nerves grossly intact. Moves all 4 extremities w/o difficulty.  Affect pleasant.  Assessment/Plan: 1. Unroofed coronary sinus: There was concern for this on 11/22 cMRI, it was confirmed by coronary CTA.  There appears to be significant left to right shunting on CT. There was not an associated persistent left SVC.  The RV had normal systolic function but was moderately dilated on cMRI. RHC showed surprisingly normal PA pressure but left => right shunt at atrial level with significant Qp/Qs 2.31.  Suspect that some of his symptomatology is related to his left to right shunting with CHF though filling pressures not high on RHC.  - With high Qp/Qs, Dr. Shirlee Latch thinks that he will need surgical correction of the unroofed coronary sinus due to left to right shunting. He has been referred to the [facility] adult congenital heart program for evaluation, he has been seen by Dr. Deatra James but he is unfortunately unable to afford out of pocket fees to continue follow ups. 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 No CAD on coronary CTA and no MI-pattern LGE at the apex.  It does not appear to be a pseudoaneurysm. Congenital LV apical diverticulum is often associated with other abnormalities (in this case, unroofed CS).   3. Chronic systolic CHF: Echo with EF 40-45% in 8/22, cMRI with LV EF 40% in 11/22.  There was septal mid-wall LGE.  No CAD on coronary CTA.  Nonischemic cardiomyopathy, uncertain etiology.   Echo 12/23 with EF 35-40%. NYHA class III symptoms but hard to determine the cause of the symptoms.  He is not volume overloaded today and filling pressures by RHC were not significantly elevated despite robust shunting by Qp/Qs. I am not sure of the etiology of his dyspnea, though worry it could be related to his left=>right shunt. GDMT limited by orthostatic symptoms. - With marked fatigue, will check CBC and iron studies. - Decrease losartan to 12.5 mg q hs. BMET and BNP today. - Continue Coreg 6.25 mg bid.     - Continue spironolactone 12.5 mg daily.  - No SGLT2 inhibitor with Type 1 DM.  - I do not think that he would benefit from a diuretic.  4. Type 1 diabetes: Per PCP.  5. Smoking: Needs to quit.  - PFTs 12/23 with normal spirometry but low DLCO. ? Pulmonary vascular issue. Query whether COPD plays a role in his dyspnea.  6. Cocaine abuse: Strongly urged him to avoid.   7. Palpitations: Holter in 7/23 showed rare PVCs and PACs, no atrial fibrillation.  8. Night terrors/sleep paralysis: He needs a sleep study, trying to arrange at [facility].  No insurance a barrier. Suspect playing a role in fatigue. 9. SDOH: HFSW to help him with getting Medicaid + disability. He is out of work. He has food insecurity. - Gifted a food bag today and $25 gift card.  Follow up in 2-3 months with Dr. Shirlee Latch.  Anderson [facility] FNP-BC 05/02/2022

## 2022-05-01 ENCOUNTER — Telehealth (HOSPITAL_COMMUNITY): Payer: Self-pay

## 2022-05-01 NOTE — Telephone Encounter (Signed)
Called to confirm/remind patient of their appointment at the [facility] on 05/02/22 @ 2:00pm.   Left message to confirm appointment

## 2022-05-02 ENCOUNTER — Encounter (HOSPITAL_COMMUNITY): Payer: Self-pay

## 2022-05-02 ENCOUNTER — Other Ambulatory Visit (HOSPITAL_COMMUNITY): Payer: Self-pay

## 2022-05-02 ENCOUNTER — Ambulatory Visit (HOSPITAL_COMMUNITY)
Admission: RE | Admit: 2022-05-02 | Discharge: 2022-05-02 | Disposition: A | Discharge status: Home / Self Care | Payer: Self-pay | Source: Ambulatory Visit | Attending: Family Medicine | Admitting: Family Medicine

## 2022-05-02 VITALS — BP 148/72 | HR 87 | Wt 131.0 lb

## 2022-05-02 DIAGNOSIS — R5383 Other fatigue: Secondary | ICD-10-CM | POA: Insufficient documentation

## 2022-05-02 DIAGNOSIS — F1410 Cocaine abuse, uncomplicated: Secondary | ICD-10-CM

## 2022-05-02 DIAGNOSIS — I110 Hypertensive heart disease with heart failure: Secondary | ICD-10-CM | POA: Insufficient documentation

## 2022-05-02 DIAGNOSIS — E108 Type 1 diabetes mellitus with unspecified complications: Secondary | ICD-10-CM

## 2022-05-02 DIAGNOSIS — F514 Sleep terrors [night terrors]: Secondary | ICD-10-CM

## 2022-05-02 DIAGNOSIS — Z597 Insufficient social insurance and welfare support: Secondary | ICD-10-CM | POA: Insufficient documentation

## 2022-05-02 DIAGNOSIS — Z139 Encounter for screening, unspecified: Secondary | ICD-10-CM

## 2022-05-02 DIAGNOSIS — I253 Aneurysm of heart: Secondary | ICD-10-CM

## 2022-05-02 DIAGNOSIS — Z794 Long term (current) use of insulin: Secondary | ICD-10-CM | POA: Insufficient documentation

## 2022-05-02 DIAGNOSIS — Q248 Other specified congenital malformations of heart: Secondary | ICD-10-CM

## 2022-05-02 DIAGNOSIS — F17200 Nicotine dependence, unspecified, uncomplicated: Secondary | ICD-10-CM

## 2022-05-02 DIAGNOSIS — Z7982 Long term (current) use of aspirin: Secondary | ICD-10-CM | POA: Insufficient documentation

## 2022-05-02 DIAGNOSIS — I428 Other cardiomyopathies: Secondary | ICD-10-CM | POA: Insufficient documentation

## 2022-05-02 DIAGNOSIS — F32A Depression, unspecified: Secondary | ICD-10-CM | POA: Insufficient documentation

## 2022-05-02 DIAGNOSIS — E109 Type 1 diabetes mellitus without complications: Secondary | ICD-10-CM | POA: Insufficient documentation

## 2022-05-02 DIAGNOSIS — R002 Palpitations: Secondary | ICD-10-CM

## 2022-05-02 DIAGNOSIS — F17210 Nicotine dependence, cigarettes, uncomplicated: Secondary | ICD-10-CM | POA: Insufficient documentation

## 2022-05-02 DIAGNOSIS — Z5941 Food insecurity: Secondary | ICD-10-CM | POA: Insufficient documentation

## 2022-05-02 DIAGNOSIS — Z79899 Other long term (current) drug therapy: Secondary | ICD-10-CM | POA: Insufficient documentation

## 2022-05-02 DIAGNOSIS — F1290 Cannabis use, unspecified, uncomplicated: Secondary | ICD-10-CM | POA: Insufficient documentation

## 2022-05-02 DIAGNOSIS — I5022 Chronic systolic (congestive) heart failure: Secondary | ICD-10-CM | POA: Insufficient documentation

## 2022-05-02 DIAGNOSIS — I480 Paroxysmal atrial fibrillation: Secondary | ICD-10-CM | POA: Insufficient documentation

## 2022-05-02 DIAGNOSIS — Z8616 Personal history of COVID-19: Secondary | ICD-10-CM | POA: Insufficient documentation

## 2022-05-02 LAB — CBC
HCT: 41.7 % (ref 39.0–52.0)
Hemoglobin: 14.6 g/dL (ref 13.0–17.0)
MCH: 31.6 pg (ref 26.0–34.0)
MCHC: 35 g/dL (ref 30.0–36.0)
MCV: 90.3 fL (ref 80.0–100.0)
Platelets: 272 10*3/uL (ref 150–400)
RBC: 4.62 MIL/uL (ref 4.22–5.81)
RDW: 13.4 % (ref 11.5–15.5)
WBC: 8.9 10*3/uL (ref 4.0–10.5)
nRBC: 0 % (ref 0.0–0.2)

## 2022-05-02 LAB — BASIC METABOLIC PANEL
Anion gap: 8 (ref 5–15)
BUN: 14 mg/dL (ref 6–20)
CO2: 24 mmol/L (ref 22–32)
Calcium: 8.6 mg/dL — ABNORMAL LOW (ref 8.9–10.3)
Chloride: 104 mmol/L (ref 98–111)
Creatinine, Ser: 0.97 mg/dL (ref 0.61–1.24)
GFR, Estimated: >60 mL/min (ref >60)
Glucose, Bld: 280 mg/dL — ABNORMAL HIGH (ref 70–99)
Potassium: 4 mmol/L (ref 3.5–5.1)
Sodium: 136 mmol/L (ref 135–145)

## 2022-05-02 LAB — IRON AND TIBC
Iron: 117 ug/dL (ref 45–182)
Saturation Ratios: 36 % (ref 17.9–39.5)
TIBC: 326 ug/dL (ref 250–450)
UIBC: 209 ug/dL

## 2022-05-02 LAB — FERRITIN: Ferritin: 110 ng/mL (ref 24–336)

## 2022-05-02 LAB — BRAIN NATRIURETIC PEPTIDE: B Natriuretic Peptide: 39.8 pg/mL (ref 0.0–100.0)

## 2022-05-02 MED ORDER — LOSARTAN POTASSIUM 25 MG PO TABS
12.5000 mg | ORAL_TABLET | Freq: Every day | ORAL | 8 refills | Status: DC
  Filled 2022-05-02: qty 15, 30d supply, fill #0

## 2022-05-02 MED ORDER — LOSARTAN POTASSIUM 25 MG PO TABS
12.5000 mg | ORAL_TABLET | Freq: Every evening | ORAL | 8 refills | Status: DC
  Filled 2022-05-02: qty 15, 30d supply, fill #0
  Filled 2022-06-30: qty 15, 30d supply, fill #1
  Filled 2022-07-26: qty 45, 90d supply, fill #2

## 2022-05-02 NOTE — Patient Instructions (Addendum)
Thank you for coming in today  Labs were done today, if any labs are abnormal the clinic will call you No news is good news  DECREASE Losartan to 12.5 mg 1/2 tablet nightly before bedtime  Your physician recommends that you schedule a follow-up appointment in:  3 months with Dr. Kendall Flack will receive a reminder letter in the mail a few months in advance. If you don't receive a letter, please call our office to schedule the follow-up appointment.  Social work provided yo with a gift card and food today    Do the following things EVERYDAY: Weigh yourself in the morning before breakfast. Write it down and keep it in a log. Take your medicines as prescribed Eat low salt foods--Limit salt (sodium) to 2000 mg per day.  Stay as active as you can everyday Limit all fluids for the day to less than 2 liters  At the [facility], you and your health needs are our priority. As part of our continuing mission to provide you with exceptional heart care, we have created designated Provider Care Teams. These Care Teams include your primary Cardiologist (physician) and Advanced Practice Providers (APPs- Physician Assistants and Nurse Practitioners) who all work together to provide you with the care you need, when you need it.   You may see any of the following providers on your designated Care Team at your next follow up: Dr Glori Bickers Dr Loralie Champagne Dr. Roxana Hires, NP Lyda Jester, [place] [facility] [place], [place] Forestine Na, NP Audry Riles, PharmD   Please be sure to bring in all your medications bottles to every appointment.   If you have any questions or concerns before your next appointment please send [place] a message through [place] or call our office at [phone].    TO LEAVE A MESSAGE FOR THE NURSE SELECT OPTION 2, PLEASE LEAVE A MESSAGE INCLUDING: YOUR NAME DATE OF BIRTH CALL BACK NUMBER REASON FOR CALL**this is important as we  prioritize the call backs  YOU WILL RECEIVE A CALL BACK THE SAME DAY AS LONG AS YOU CALL BEFORE 4:00 PM

## 2022-05-03 ENCOUNTER — Other Ambulatory Visit: Payer: Self-pay

## 2022-05-03 NOTE — Addendum Note (Signed)
Encounter addended by: Louann Liv, LCSW on: 05/03/2022 9:55 AM  Actions taken: Flowsheet accepted, Clinical Note Signed

## 2022-05-03 NOTE — Progress Notes (Signed)
H&V Care Navigation CSW Progress Note  Clinical Social Worker met with patient to assist with food insecurity.  Patient is participating in a Managed Medicaid Plan:  No uninsured  Patient shared with staff that he is low on food and struggles with financial needs. CSW met with patient and provided a H&V Food Bag of low sodium/heart healthy items. CSW also provided a gas card to assist with transportation needs.  Patient grateful for the support and assistance. Raquel Sarna, LCSW, CCSW-MCS [phone]   SDOH Screenings   Food Insecurity: Food Insecurity Present (05/03/2022)  Housing: Medium Risk (01/09/2022)  Transportation Needs: Unmet Transportation Needs (01/09/2022)  Depression (PHQ2-9): High Risk (03/01/2022)  Financial Resource Strain: High Risk (11/30/2021)  Tobacco Use: High Risk (05/02/2022)

## 2022-05-04 ENCOUNTER — Telehealth (HOSPITAL_COMMUNITY): Payer: Self-pay | Admitting: Licensed Clinical Social Worker

## 2022-05-04 NOTE — Telephone Encounter (Signed)
H&V Care Navigation CSW Progress Note  Clinical Social Worker called pt to discuss SDOH concerns.  Pt continues to be uninsured and has not filled out necessary paperwork to get assistance- states his wife and him are not in good standing and that he doesn't feel like he can get the appropriate paperwork for her.    CSW informed pt that he might now be eligible for Medicaid following expansion in Dec- he will plan to apply again- applied online last time and feels as if he can do this at this time as well.  Still awaiting disability- states last time he heard from them is when he saw a doctor at their request 1 month ago.  Pt has access to a car but reports sometimes he feels like he can't drive safely.  Encouraged him to call office to request assistance if this happens for a future appt instead of not showing up.  CSW inquired about pt current mental health status- admits he remains depressed over his condition and limitations.  Pt used to see someone at [facility] but stopped "because he didn't hear anything from them" CSW encouraged pt to call them and get another appt- pt committed to calling after phonecall with CSW.  Pt admits struggles with accessing sufficient food.  CSW provided with resources to local pantries.  Pt reports issues with vision that makes reading pill boxes difficult- relies on his grandson and his wife to help. CSW submitted [place] [facility] application to see if he can get in for an exam- has been 8 years since he last went and needs specialty glasses (bifocals).   SDOH Screenings   Food Insecurity: Food Insecurity Present (05/03/2022)  Housing: Medium Risk (01/09/2022)  Transportation Needs: Unmet Transportation Needs (05/04/2022)  Depression (PHQ2-9): High Risk (03/01/2022)  Financial Resource Strain: High Risk (05/04/2022)  Tobacco Use: High Risk (05/02/2022)    Will continue to follow through clinic and assist as needed  Jorge Ny, [facility] Desk#: [phone] Cell#: [phone]

## 2022-05-05 ENCOUNTER — Other Ambulatory Visit: Payer: Self-pay

## 2022-05-12 ENCOUNTER — Telehealth (HOSPITAL_COMMUNITY): Payer: Self-pay | Admitting: Licensed Clinical Social Worker

## 2022-05-12 NOTE — Telephone Encounter (Signed)
H&V Care Navigation CSW Progress Note  Clinical Social Worker received notice from [place] that pt has been approved for a free eye exam.  CSW called pt to inform but unable to leave VM.  Mailing out letter that he will need to bring to his eye exam and will attempt to contact pt again to inform directly.   SDOH Screenings   Food Insecurity: Food Insecurity Present (05/03/2022)  Housing: Medium Risk (01/09/2022)  Transportation Needs: Unmet Transportation Needs (05/04/2022)  Depression (PHQ2-9): High Risk (03/01/2022)  Financial Resource Strain: High Risk (05/04/2022)  Tobacco Use: High Risk (05/02/2022)    Jorge Ny, LCSW Clinical Social Worker [facility] Desk#: [phone] Cell#: [phone]

## 2022-05-19 ENCOUNTER — Other Ambulatory Visit: Payer: Self-pay

## 2022-05-25 ENCOUNTER — Telehealth: Payer: Self-pay | Admitting: Pharmacist

## 2022-05-25 ENCOUNTER — Other Ambulatory Visit: Payer: Self-pay

## 2022-05-25 NOTE — Progress Notes (Signed)
Patient attempted to be outreached by Junius Finner, PharmD Candidate on 05/25/2022 to discuss hypertension. Patient does not have voicemail mailbox set up, no answer to call. Will follow-up.   Joyce of Pharmacy  PharmD Candidate [phone]

## 2022-05-26 ENCOUNTER — Other Ambulatory Visit: Payer: Self-pay

## 2022-06-01 ENCOUNTER — Ambulatory Visit (INDEPENDENT_AMBULATORY_CARE_PROVIDER_SITE_OTHER): Payer: Self-pay | Admitting: Primary Care

## 2022-06-06 NOTE — Telephone Encounter (Signed)
Patient attempted to be outreached by Donney Rankins, PharmD Candidate on 06/06/2022 to discuss hypertension.  He initially answered the phone, but stated it was not a good time to talk as he was not feeling well and asked could I call back.  Called patient back on both numbers listed and received no answer.  No voicemail box set up on either.    [employer]  PharmD Candidate 2024   Maryan Puls, PharmD PGY-1 [facility] Resident

## 2022-06-12 NOTE — Telephone Encounter (Signed)
Patient attempted to be outreached by Levi Aland, PharmD Candidate on 06/12/22 to discuss hypertension. Unable to leave voicemail as it is not set up.   Levi Aland, PharmD Candidate, Class of 2024  Aristes E. Marsh, PharmD PGY-1 [facility] Resident

## 2022-06-13 ENCOUNTER — Ambulatory Visit (INDEPENDENT_AMBULATORY_CARE_PROVIDER_SITE_OTHER): Payer: Self-pay | Admitting: Primary Care

## 2022-06-27 ENCOUNTER — Ambulatory Visit (INDEPENDENT_AMBULATORY_CARE_PROVIDER_SITE_OTHER): Payer: Self-pay | Admitting: Primary Care

## 2022-06-28 ENCOUNTER — Encounter (INDEPENDENT_AMBULATORY_CARE_PROVIDER_SITE_OTHER): Payer: Self-pay

## 2022-06-29 ENCOUNTER — Ambulatory Visit (INDEPENDENT_AMBULATORY_CARE_PROVIDER_SITE_OTHER): Payer: Self-pay | Admitting: Primary Care

## 2022-06-30 ENCOUNTER — Other Ambulatory Visit: Payer: Self-pay

## 2022-07-03 ENCOUNTER — Other Ambulatory Visit: Payer: Self-pay

## 2022-07-03 ENCOUNTER — Other Ambulatory Visit (INDEPENDENT_AMBULATORY_CARE_PROVIDER_SITE_OTHER): Payer: Self-pay | Admitting: Primary Care

## 2022-07-03 MED ORDER — TRUEPLUS 5-BEVEL PEN NEEDLES 31G X 8 MM MISC
1.0000 | Freq: Three times a day (TID) | 11 refills | Status: DC
  Filled 2022-07-03 – 2022-07-26 (×2): qty 100, 33d supply, fill #0
  Filled 2022-09-27 – 2022-09-28 (×2): qty 100, 33d supply, fill #1
  Filled 2022-11-02 – 2022-11-03 (×2): qty 100, 33d supply, fill #2
  Filled 2022-12-27: qty 100, 33d supply, fill #3
  Filled 2023-02-06: qty 100, 33d supply, fill #4
  Filled 2023-03-21: qty 100, 33d supply, fill #5
  Filled 2023-05-02: qty 100, 33d supply, fill #6
  Filled 2023-06-13: qty 100, 33d supply, fill #7

## 2022-07-17 ENCOUNTER — Ambulatory Visit (INDEPENDENT_AMBULATORY_CARE_PROVIDER_SITE_OTHER): Payer: Self-pay | Admitting: Primary Care

## 2022-07-17 ENCOUNTER — Other Ambulatory Visit: Payer: Self-pay

## 2022-07-21 ENCOUNTER — Telehealth: Payer: Self-pay | Admitting: Pharmacist

## 2022-07-21 NOTE — Progress Notes (Signed)
Patient attempted to be outreached by Clovis Pu, PharmD Candidate on 5/4 to discuss hypertension. Patient hung up upon answering the call.   Clovis Pu, PharmD Candidate   Catie Eppie Gibson, PharmD, BCACP, CPP [facility] [phone]

## 2022-07-25 ENCOUNTER — Telehealth (HOSPITAL_COMMUNITY): Payer: Self-pay | Admitting: Licensed Clinical Social Worker

## 2022-07-25 NOTE — Telephone Encounter (Signed)
H&V Care Navigation CSW Progress Note  Clinical Social Worker assisted provider in making letter for food stamps detailing pt medical condition and how it affects his ability to work.  Letter sent in to case worker- hopefully this will allow pt to get food stamps reapproved.   SDOH Screenings   Food Insecurity: Food Insecurity Present (05/03/2022)  Housing: Medium Risk (01/09/2022)  Transportation Needs: Unmet Transportation Needs (05/04/2022)  Depression (PHQ2-9): High Risk (03/01/2022)  Financial Resource Strain: High Risk (05/04/2022)  Tobacco Use: High Risk (05/02/2022)   Burna Sis, LCSW Clinical Social Worker [facility] Desk#: [phone] Cell#: [phone]

## 2022-07-26 ENCOUNTER — Other Ambulatory Visit (INDEPENDENT_AMBULATORY_CARE_PROVIDER_SITE_OTHER): Payer: Self-pay | Admitting: Primary Care

## 2022-07-26 ENCOUNTER — Other Ambulatory Visit: Payer: Self-pay

## 2022-07-26 ENCOUNTER — Other Ambulatory Visit (HOSPITAL_COMMUNITY): Payer: Self-pay | Admitting: Student in an Organized Health Care Education/Training Program

## 2022-07-26 ENCOUNTER — Other Ambulatory Visit (HOSPITAL_COMMUNITY): Payer: Self-pay | Admitting: Family Medicine

## 2022-07-26 DIAGNOSIS — E1069 Type 1 diabetes mellitus with other specified complication: Secondary | ICD-10-CM

## 2022-07-26 DIAGNOSIS — F411 Generalized anxiety disorder: Secondary | ICD-10-CM

## 2022-07-26 DIAGNOSIS — F4312 Post-traumatic stress disorder, chronic: Secondary | ICD-10-CM

## 2022-07-26 DIAGNOSIS — F331 Major depressive disorder, recurrent, moderate: Secondary | ICD-10-CM

## 2022-07-26 MED ORDER — SPIRONOLACTONE 25 MG PO TABS
12.5000 mg | ORAL_TABLET | Freq: Every day | ORAL | 5 refills | Status: DC
  Filled 2022-07-26: qty 15, 30d supply, fill #0

## 2022-07-26 NOTE — Telephone Encounter (Signed)
Received request from patient's pharmacy for refill of his Zoloft.  As patient has not been seen in this clinic since 09/2021 and last refill sent in 03/2022 stated a follow-up appointment would need to be made for further refills this request will not be filled.  Will have front desk call to attempt schedule an appointment.     Arna Snipe MD Resident

## 2022-07-27 ENCOUNTER — Other Ambulatory Visit: Payer: Self-pay

## 2022-07-27 MED ORDER — BASAGLAR KWIKPEN 100 UNIT/ML ~~LOC~~ SOPN
20.0000 [IU] | PEN_INJECTOR | Freq: Two times a day (BID) | SUBCUTANEOUS | 3 refills | Status: DC
  Filled 2022-07-27: qty 9, 23d supply, fill #0
  Filled 2022-09-27: qty 9, 23d supply, fill #1
  Filled 2022-11-13: qty 9, 23d supply, fill #2
  Filled 2022-12-27: qty 9, 23d supply, fill #3

## 2022-07-27 NOTE — Telephone Encounter (Signed)
Requested Prescriptions  Pending Prescriptions Disp Refills   Insulin Glargine (BASAGLAR KWIKPEN) 100 UNIT/ML 9 mL 3    Sig: Inject 20 Units into the skin 2 (two) times daily.     Endocrinology:  Diabetes - Insulins Failed - 07/26/2022  2:56 PM      Failed - HBA1C is between 0 and 7.9 and within 180 days    HbA1c, POC (controlled diabetic range)  Date Value Ref Range Status  03/01/2022 9.9 (A) 0.0 - 7.0 % Final         Passed - Valid encounter within last 6 months    Recent Outpatient Visits           4 months ago Type 1 diabetes mellitus with other specified complication (HCC)   [place] [facility] Grayce Sessions, NP   8 months ago Type 1 diabetes mellitus with other specified complication [facility])   [place] [facility] Grayce Sessions, NP   10 months ago Adjustment reaction with anxiety and depression   [place] [facility] Grayce Sessions, NP   11 months ago Adjustment reaction with anxiety and depression   [place] [facility] Grayce Sessions, NP   1 year ago Type 1 diabetes mellitus with other specified complication [facility])   [place] [facility] Dieterich, Cornelius Moras, RPH-CPP       Future Appointments             In 5 days Randa Evens, Kinnie Scales, NP [place] [facility]

## 2022-07-28 ENCOUNTER — Other Ambulatory Visit: Payer: Self-pay

## 2022-08-01 ENCOUNTER — Ambulatory Visit (INDEPENDENT_AMBULATORY_CARE_PROVIDER_SITE_OTHER): Payer: Self-pay | Admitting: Primary Care

## 2022-08-02 ENCOUNTER — Other Ambulatory Visit: Payer: Self-pay

## 2022-08-04 ENCOUNTER — Other Ambulatory Visit: Payer: Self-pay

## 2022-08-11 ENCOUNTER — Encounter (HOSPITAL_COMMUNITY): Payer: Self-pay | Admitting: Student in an Organized Health Care Education/Training Program

## 2022-08-11 NOTE — Progress Notes (Deleted)
BH MD/PA/NP OP Progress Note  08/11/2022 2:45 PM Kyal Uhls  MRN:  [id number]  Chief Complaint: No chief complaint on file.  HPI:  Reinhard "Ray" Yzaguirre is a 47 yr old male who presents for follow up and medication management.  PPHx is significant for Depression and Anxiety, he has attempted Suicide in the past (most recent OD several years ago), has been to Detox before.    ***  Visit Diagnosis: No diagnosis found.  Past Psychiatric History: Depression and Anxiety, he has attempted Suicide in the past (most recent OD several years ago), has been to Detox before.  Past Medical History:  Past Medical History:  Diagnosis Date   CKD (chronic kidney disease), stage II    Diabetes mellitus without complication (HCC)    type 1   NICM (nonischemic cardiomyopathy) (HCC)    Coronary Ca score 0 and no CAD on coronary CTGA 04/2021   Polysubstance abuse [facility])     Past Surgical History:  Procedure Laterality Date   DENTAL SURGERY     RIGHT HEART CATH N/A 05/13/2021   Procedure: RIGHT HEART CATH;  Surgeon: Laurey Morale, MD;  Location: [facility] INVASIVE CV LAB;  Service: Cardiovascular;  Laterality: N/A;    Family Psychiatric History: Multiple family members with EtOH abuse. No known diagnosis' or suicides.  Family History:  Family History  Problem Relation Age of Onset   Arthritis Mother    Diabetes Mellitus II Father     Social History:  Social History   Socioeconomic History   Marital status: Married    Spouse name: Not on file   Number of children: Not on file   Years of education: Not on file   Highest education level: Not on file  Occupational History   Not on file  Tobacco Use   Smoking status: Every Day    Packs/day: 0.50    Years: 20.00    Additional pack years: 0.00    Total pack years: 10.00    Types: Cigarettes   Smokeless tobacco: Never  Vaping Use   Vaping Use: Never used  Substance and Sexual Activity   Alcohol use: Yes    Alcohol/week: 40.0 standard  drinks of alcohol    Types: 40 Cans of beer per week   Drug use: Yes    Frequency: 5.0 times per week    Types: Marijuana, Cocaine   Sexual activity: Not on file  Other Topics Concern   Not on file  Social History Narrative   Not on file   Social Determinants of Health   Financial Resource Strain: High Risk (05/04/2022)   Overall Financial Resource Strain (CARDIA)    Difficulty of Paying Living Expenses: Very hard  Food Insecurity: Food Insecurity Present (07/25/2022)   Hunger Vital Sign    Worried About Running Out of Food in the Last Year: Often true    Ran Out of Food in the Last Year: Often true  Transportation Needs: Unmet Transportation Needs (05/04/2022)   PRAPARE - Administrator, Civil Service (Medical): Yes    Lack of Transportation (Non-Medical): Yes  Physical Activity: Not on file  Stress: Not on file  Social Connections: Not on file    Allergies:  Allergies  Allergen Reactions   Shellfish Allergy Anaphylaxis    Metabolic Disorder Labs: Lab Results  Component Value Date   HGBA1C 9.9 (A) 03/01/2022   MPG 309.18 11/23/2020   MPG 294.83 03/13/2017   No results found for: "PROLACTIN" Lab Results  Component Value Date   CHOL 101 12/06/2020   TRIG 34 12/06/2020   HDL 62 12/06/2020   CHOLHDL 1.6 12/06/2020   VLDL 7 12/06/2020   LDLCALC 32 12/06/2020   LDLCALC 69 01/28/2018   No results found for: "TSH"  Therapeutic Level Labs: No results found for: "LITHIUM" No results found for: "VALPROATE" No results found for: "CBMZ"  Current Medications: Current Outpatient Medications  Medication Sig Dispense Refill   acetaminophen (TYLENOL) 325 MG tablet Take 650 mg by mouth every 6 (six) hours as needed for moderate pain.     aspirin EC 81 MG tablet Take 1 tablet (81 mg total) by mouth daily. Swallow whole. 30 tablet 5   Blood Glucose Monitoring Suppl (TRUE METRIX METER) w/Device KIT Use kit to check blood glucose up to four times daily as directed 1  kit 0   carvedilol (COREG) 6.25 MG tablet Take 1 tablet (6.25 mg total) by mouth 2 (two) times daily. 60 tablet 5   fluticasone (FLONASE) 50 MCG/ACT nasal spray Place 2 sprays into both nostrils daily.     glucose blood (TRUE METRIX BLOOD GLUCOSE TEST) test strip Use as instructed 100 each 12   Insulin Glargine (BASAGLAR KWIKPEN) 100 UNIT/ML Inject 20 Units into the skin 2 (two) times daily. 9 mL 3   insulin lispro (HUMALOG KWIKPEN) 100 UNIT/ML KwikPen Sliding scale inject 3 times daily with meals. Blood sugars 0-150 give 0 units,201-250:4 units,251-300:6 units,301-350: 8 units,351-400: 10 units,>400 12 units & call M.D. 15 mL 2   Insulin Pen Needle (TRUEPLUS 5-BEVEL PEN NEEDLES) 31G X 8 MM MISC Use as directed 3 (three) times daily before meals. 100 each 11   Insulin Syringe-Needle U-100 (TRUEPLUS INSULIN SYRINGE) 31G X 5/16" 0.3 ML MISC [place] as directed as needed. 100 each 0   Ipratropium-Albuterol (COMBIVENT) 20-100 MCG/ACT AERS respimat Inhale 1 puff by mouth into the lungs every 6 hours as needed for wheezing. 4 g 0   losartan (COZAAR) 25 MG tablet Take 1/2 tablet (12.5 mg total) by mouth at bedtime. 15 tablet 8   sertraline (ZOLOFT) 50 MG tablet Take 1.5 tablets (75 mg total) by mouth daily. 45 tablet 0   spironolactone (ALDACTONE) 25 MG tablet Take 0.5 tablets (12.5 mg total) by mouth daily. 15 tablet 5   TRUEplus Lancets 28G MISC Use as directed up to 4 times daily. 100 each 0   No current facility-administered medications for this visit.     Musculoskeletal: Strength & Muscle Tone: {desc; muscle tone:32375} Gait & Station: {PE GAIT ED ZOXW:96045} Patient leans: {Patient Leans:21022755}  Psychiatric Specialty Exam: Review of Systems  There were no vitals taken for this visit.There is no height or weight on file to calculate BMI.  General Appearance: {Appearance:22683}  Eye Contact:  {BHH EYE CONTACT:22684}  Speech:  {Speech:22685}  Volume:  {Volume (PAA):22686}  Mood:  {BHH  MOOD:22306}  Affect:  {Affect (PAA):22687}  Thought Process:  {Thought Process (PAA):22688}  Orientation:  {BHH ORIENTATION (PAA):22689}  Thought Content: {Thought Content:22690}   Suicidal Thoughts:  {ST/HT (PAA):22692}  Homicidal Thoughts:  {ST/HT (PAA):22692}  Memory:  {BHH MEMORY:22881}  Judgement:  {Judgement (PAA):22694}  Insight:  {Insight (PAA):22695}  Psychomotor Activity:  {Psychomotor (PAA):22696}  Concentration:  {Concentration:21399}  Recall:  {BHH GOOD/FAIR/POOR:22877}  Fund of Knowledge: {BHH GOOD/FAIR/POOR:22877}  Language: {BHH GOOD/FAIR/POOR:22877}  Akathisia:  {BHH YES OR NO:22294}  Handed:  {Handed:22697}  AIMS (if indicated): {Desc; done/not:10129}  Assets:  {Assets (PAA):22698}  ADL's:  {BHH WUJ'W:11914}  Cognition: {chl  bhh cognition:304700322}  Sleep:  {BHH GOOD/FAIR/POOR:22877}   Screenings: GAD-7    Flowsheet Row Office Visit from 03/01/2022 in [facility] Office Visit from 09/07/2021 in [facility] Office Visit from 08/03/2021 in [facility] Office Visit from 06/01/2021 in [facility] Office Visit from 03/01/2021 in [facility]  Total GAD-7 Score 17 20 21 21 9       PHQ2-9    Flowsheet Row Office Visit from 03/01/2022 in [facility] Office Visit from 09/07/2021 in [facility] Office Visit from 08/03/2021 in [facility] Counselor from 06/20/2021 in [facility] Office Visit from 06/01/2021 in [facility]  PHQ-2 Total Score 4 6 5 6 6   PHQ-9 Total Score 19 22 23 24 24       Flowsheet Row Office Visit from 09/07/2021 in [facility] Counselor from 06/20/2021 in [facility] Admission (Discharged) from 05/13/2021 in [facility] CARDIAC CATH LAB  C-SSRS RISK CATEGORY Error: Question 1 not populated Moderate Risk No Risk        Assessment and Plan:  Elonzo "Ray" Whiteford is a 47 yr old male who presents for follow up and medication management.  PPHx is significant for Depression and Anxiety, he has attempted Suicide in the past (most recent OD several years ago), has been to Detox before.    ***   MDD, Moderate  GAD  Chronic PTSD: -Increase Zoloft to 75 mg daily for depression, anxiety -Stop Prazosin    Collaboration of Care: Collaboration of Care: {BH OP Collaboration of Care:21014065}  Patient/Guardian was advised Release of Information must be obtained prior to any record release in order to collaborate their care with an outside provider. Patient/Guardian was advised if they have not already done so to contact the registration department to sign all necessary forms in order for [place] to release information regarding their care.   Consent: Patient/Guardian gives verbal consent for treatment and assignment of benefits for services provided during this visit. Patient/Guardian expressed understanding and agreed to proceed.    Lauro Franklin, MD 08/11/2022, 2:45 PM

## 2022-09-21 ENCOUNTER — Other Ambulatory Visit (HOSPITAL_COMMUNITY): Payer: Self-pay

## 2022-09-25 ENCOUNTER — Other Ambulatory Visit: Payer: Self-pay

## 2022-09-25 NOTE — Progress Notes (Signed)
   [name] 05/06/1975 [id number]  Patient outreached by Mack Guise , PharmD Candidate on 09/25/2022.  Blood Pressure Readings: Last documented ambulatory systolic blood pressure: 148 Last documented ambulatory diastolic blood pressure: 72 Does the patient have a validated home blood pressure machine?: No They report home readings No  Medication review was performed. Is the patient taking their medications as prescribed?: Yes Differences from their prescribed list include: No  The following barriers to adherence were noted: Does the patient have cost concerns?: No Does the patient have transportation concerns?: No Does the patient need assistance obtaining refills?: No Does the patient occassionally forget to take some of their prescribed medications?: No Does the patient feel like one/some of their medications make them feel poorly?: No Does the patient have questions or concerns about their medications?: No Does the patient have a follow up scheduled with their primary care provider/cardiologist?: Yes   Interventions: Interventions Completed: Medications were reviewed  The patient has follow up scheduled:  PCP: Grayce Sessions, NP   Mack Guise, Student-PharmD

## 2022-09-26 ENCOUNTER — Telehealth: Payer: Self-pay

## 2022-09-26 ENCOUNTER — Encounter (INDEPENDENT_AMBULATORY_CARE_PROVIDER_SITE_OTHER): Payer: Self-pay | Admitting: Primary Care

## 2022-09-26 ENCOUNTER — Ambulatory Visit (INDEPENDENT_AMBULATORY_CARE_PROVIDER_SITE_OTHER): Payer: Self-pay | Admitting: Primary Care

## 2022-09-26 VITALS — BP 129/72 | HR 96 | Resp 16 | Wt 128.2 lb

## 2022-09-26 DIAGNOSIS — E1069 Type 1 diabetes mellitus with other specified complication: Secondary | ICD-10-CM

## 2022-09-26 LAB — POCT GLYCOSYLATED HEMOGLOBIN (HGB A1C): HbA1c, POC (controlled diabetic range): 10.7 % — AB (ref 0.0–7.0)
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
## 2022-09-26 NOTE — Telephone Encounter (Signed)
I spoke to the patient while he was with Efrain Sella, NP at his appointment today at [facility].  He explained that he has no income and is not sure of the status of his disability application. I told him I would check with Rosetta Posner, LCSW  He said that his food stamps were terminated. He needs to submit a " letter" but is not sure what the letter is supposed to state. He was agreeable to placing a referral to [employer] [place] for assistance with addressing this problem and the referral was then placed.  He spoke of food insecurity and was agreeable to placing a referral to [employer] and that referral was placed.   He was also agreeable to placing a referral to the [facility] Eligibility caseworkers to assist with applying for Medicaid and that request was placed as well.

## 2022-09-27 ENCOUNTER — Telehealth (HOSPITAL_COMMUNITY): Payer: Self-pay | Admitting: Licensed Clinical Social Worker

## 2022-09-27 ENCOUNTER — Other Ambulatory Visit (INDEPENDENT_AMBULATORY_CARE_PROVIDER_SITE_OTHER): Payer: Self-pay | Admitting: Primary Care

## 2022-09-27 DIAGNOSIS — E1069 Type 1 diabetes mellitus with other specified complication: Secondary | ICD-10-CM

## 2022-09-27 NOTE — Telephone Encounter (Signed)
H&V Care Navigation CSW Progress Note  Clinical Social Worker received message from [facility] with [facility] regarding pt concerns.  CSW spoke with pt who reports disability was denied in March and he didn't see the letter until after the deadline for appeal had passed- he still feels as if he can't work and plans to reapply.  Pt reports he needs help with food stamps- lost them a few months ago it sounds like.  Pt got referral to legal aid from [facility] with [employer] and CSW made phone appt with [employer] to start another application if needed.  Pt being recommended for sleep study from PCP and they asked for assistance in placing order.  CSW discussed with provider and they will plan.  Also saw pt was due back for visit in April and was never seen- sent to front desk to schedule.  Pt needing to apply for Medicaid- provided him with address of [facility] DHHS workers.   SDOH Screenings   Food Insecurity: Food Insecurity Present (09/26/2022)  Housing: Medium Risk (01/09/2022)  Transportation Needs: Unmet Transportation Needs (05/04/2022)  Depression (PHQ2-9): High Risk (09/26/2022)  Financial Resource Strain: High Risk (09/26/2022)  Tobacco Use: High Risk (09/26/2022)   Burna Sis, LCSW Clinical Social Worker [facility] Desk#: [phone] Cell#: [phone]

## 2022-09-27 NOTE — Telephone Encounter (Signed)
Isaac Posner, LCSW, checked with cardiology regarding the sleep study and they  will not be ordering a sleep study for sleep terrors and paralysis-

## 2022-09-28 ENCOUNTER — Other Ambulatory Visit: Payer: Self-pay

## 2022-09-28 NOTE — Telephone Encounter (Signed)
Will forward to provider  

## 2022-09-29 ENCOUNTER — Other Ambulatory Visit: Payer: Self-pay

## 2022-09-29 MED ORDER — INSULIN LISPRO (1 UNIT DIAL) 100 UNIT/ML (KWIKPEN)
PEN_INJECTOR | SUBCUTANEOUS | 2 refills | Status: DC
  Filled 2022-09-29: qty 15, 42d supply, fill #0
  Filled 2022-11-13: qty 12, 33d supply, fill #0
  Filled 2022-12-27: qty 12, 33d supply, fill #1
  Filled 2023-02-06: qty 12, 33d supply, fill #2
  Filled 2023-03-21: qty 9, 25d supply, fill #3

## 2022-09-29 NOTE — Telephone Encounter (Signed)
Noted  

## 2022-10-05 ENCOUNTER — Other Ambulatory Visit: Payer: Self-pay

## 2022-10-05 ENCOUNTER — Telehealth: Payer: Self-pay | Admitting: Primary Care

## 2022-10-05 NOTE — Telephone Encounter (Signed)
Will forward to provider  

## 2022-10-05 NOTE — Telephone Encounter (Signed)
Copied from CRM [id number]. Topic: General - Other >> Oct 05, 2022 12:32 PM Ja-Kwan M wrote: Reason for CRM: Pt requests call back to discuss getting a letter for food stamps. Cb# [phone]

## 2022-10-06 NOTE — Telephone Encounter (Signed)
Tried contacting pt to go over provider response pt didn't answer phone kept ringing

## 2022-10-10 ENCOUNTER — Telehealth (HOSPITAL_COMMUNITY): Payer: Self-pay

## 2022-10-10 NOTE — Telephone Encounter (Signed)
Called and was unable to leave patient a voice message to confirm/remind patient of their appointment at the [facility] on 10/11/22.

## 2022-10-11 ENCOUNTER — Encounter (HOSPITAL_COMMUNITY): Payer: Self-pay

## 2022-10-11 ENCOUNTER — Telehealth (HOSPITAL_COMMUNITY): Payer: Self-pay | Admitting: Licensed Clinical Social Worker

## 2022-10-12 ENCOUNTER — Telehealth (HOSPITAL_COMMUNITY): Payer: Self-pay | Admitting: Licensed Clinical Social Worker

## 2022-10-12 NOTE — Telephone Encounter (Signed)
H&V Care Navigation CSW Progress Note  Clinical Social Worker received text from pt first thing this morning stating he was ok.  CSW called pt who reports he is in a much better mental space today and was just having a difficult time with his family yesterday which cause him to feel depressed.  Reports he is not having any thoughts of self harm or wishing to be dead today.  Confirms he is not currently seeing a therapist but had been seeing at [facility]- reports he will call them today to schedule follow up appt so he can see someone on a regular basis for care.  Pt aware he can call CSW if he is having a hard day and needs to talk or if he needs further assistance in connecting with mental health provider.   SDOH Screenings   Food Insecurity: Food Insecurity Present (09/26/2022)  Housing: Medium Risk (01/09/2022)  Transportation Needs: Unmet Transportation Needs (05/04/2022)  Depression (PHQ2-9): High Risk (09/26/2022)  Financial Resource Strain: High Risk (09/26/2022)  Tobacco Use: High Risk (09/26/2022)    Burna Sis, LCSW Clinical Social Worker [facility] Desk#: [phone] Cell#: [phone]

## 2022-10-12 NOTE — Telephone Encounter (Signed)
H&V Care Navigation CSW Progress Note  Clinical Social Worker informed by [employer] [facility] [employer] worker that pt had expressed concerning thoughts of not wanting to be alive any longer while they had been talking on the phone and then she had been unable to get in contact with him after to follow up.  CSW attempted to call x8 with no success.  Pt had appt scheduled for 3:30pm but had also expressed he had no transportation so CSW decided to request wellness check since pt has past attempt at suicide and was expressing thoughts of wanting to be dead.  Police unable to locate patient and unable to enter premises as pt did not express a plan to harm himself.   SDOH Screenings   Food Insecurity: Food Insecurity Present (09/26/2022)  Housing: Medium Risk (01/09/2022)  Transportation Needs: Unmet Transportation Needs (05/04/2022)  Depression (PHQ2-9): High Risk (09/26/2022)  Financial Resource Strain: High Risk (09/26/2022)  Tobacco Use: High Risk (09/26/2022)   Burna Sis, LCSW Clinical Social Worker [facility] Desk#: [phone] Cell#: [phone]

## 2022-10-25 ENCOUNTER — Telehealth (HOSPITAL_COMMUNITY): Payer: Self-pay

## 2022-10-25 ENCOUNTER — Telehealth (HOSPITAL_COMMUNITY): Payer: Self-pay | Admitting: Licensed Clinical Social Worker

## 2022-10-25 NOTE — Telephone Encounter (Signed)
H&V Care Navigation CSW Progress Note  Clinical Social Worker called pt to remind of appt tomorrow.  He is able to verbalize his appt details and believes he has access to a ride.  Encouraged pt to reach out ASAP if his ride falls through- pt expresses understanding   SDOH Screenings   Food Insecurity: Food Insecurity Present (09/26/2022)  Housing: Medium Risk (01/09/2022)  Transportation Needs: Unmet Transportation Needs (05/04/2022)  Depression (PHQ2-9): High Risk (09/26/2022)  Financial Resource Strain: High Risk (09/26/2022)  Tobacco Use: High Risk (09/26/2022)     Burna Sis, LCSW Clinical Social Worker [facility] Desk#: [phone] Cell#: [phone]

## 2022-10-25 NOTE — Telephone Encounter (Addendum)
Tried calling both patient's phone numbers to confirm/remind patient of their appointment at the [facility] on 10/26/22. However, was not able to leave patient a voice message.

## 2022-10-25 NOTE — Telephone Encounter (Signed)
H&V Care Navigation CSW Progress Note  Clinical Social Worker received call from pt who reports that he needs transportation to appt tomorrow.  CSW arranged ride through [employer] to pick pt up at 11:20am- pt informed of ride details   SDOH Screenings   Food Insecurity: Food Insecurity Present (09/26/2022)  Housing: Medium Risk (01/09/2022)  Transportation Needs: Unmet Transportation Needs (10/25/2022)  Depression (PHQ2-9): High Risk (09/26/2022)  Financial Resource Strain: High Risk (09/26/2022)  Tobacco Use: High Risk (09/26/2022)    Burna Sis, LCSW Clinical Social Worker [facility] Desk#: [phone] Cell#: [phone]

## 2022-10-26 ENCOUNTER — Ambulatory Visit (HOSPITAL_COMMUNITY)
Admission: RE | Admit: 2022-10-26 | Discharge: 2022-10-26 | Disposition: A | Discharge status: Home / Self Care | Payer: Medicaid Other | Source: Ambulatory Visit | Attending: Cardiology | Admitting: Cardiology

## 2022-10-26 ENCOUNTER — Other Ambulatory Visit: Payer: Self-pay

## 2022-10-26 ENCOUNTER — Encounter (HOSPITAL_COMMUNITY): Payer: Self-pay

## 2022-10-26 VITALS — BP 120/78 | HR 95 | Wt 133.6 lb

## 2022-10-26 DIAGNOSIS — F514 Sleep terrors [night terrors]: Secondary | ICD-10-CM | POA: Insufficient documentation

## 2022-10-26 DIAGNOSIS — Z5941 Food insecurity: Secondary | ICD-10-CM | POA: Insufficient documentation

## 2022-10-26 DIAGNOSIS — I110 Hypertensive heart disease with heart failure: Secondary | ICD-10-CM | POA: Insufficient documentation

## 2022-10-26 DIAGNOSIS — I428 Other cardiomyopathies: Secondary | ICD-10-CM | POA: Insufficient documentation

## 2022-10-26 DIAGNOSIS — F17210 Nicotine dependence, cigarettes, uncomplicated: Secondary | ICD-10-CM | POA: Insufficient documentation

## 2022-10-26 DIAGNOSIS — Z5986 Financial insecurity: Secondary | ICD-10-CM | POA: Insufficient documentation

## 2022-10-26 DIAGNOSIS — Z597 Insufficient social insurance and welfare support: Secondary | ICD-10-CM | POA: Insufficient documentation

## 2022-10-26 DIAGNOSIS — G4753 Recurrent isolated sleep paralysis: Secondary | ICD-10-CM | POA: Insufficient documentation

## 2022-10-26 DIAGNOSIS — Z794 Long term (current) use of insulin: Secondary | ICD-10-CM | POA: Insufficient documentation

## 2022-10-26 DIAGNOSIS — Q2113 Coronary sinus atrial septal defect: Secondary | ICD-10-CM | POA: Insufficient documentation

## 2022-10-26 DIAGNOSIS — F32A Depression, unspecified: Secondary | ICD-10-CM | POA: Insufficient documentation

## 2022-10-26 DIAGNOSIS — Z560 Unemployment, unspecified: Secondary | ICD-10-CM | POA: Insufficient documentation

## 2022-10-26 DIAGNOSIS — R5382 Chronic fatigue, unspecified: Secondary | ICD-10-CM | POA: Insufficient documentation

## 2022-10-26 DIAGNOSIS — R002 Palpitations: Secondary | ICD-10-CM | POA: Insufficient documentation

## 2022-10-26 DIAGNOSIS — I253 Aneurysm of heart: Secondary | ICD-10-CM | POA: Insufficient documentation

## 2022-10-26 DIAGNOSIS — E109 Type 1 diabetes mellitus without complications: Secondary | ICD-10-CM | POA: Insufficient documentation

## 2022-10-26 DIAGNOSIS — I5022 Chronic systolic (congestive) heart failure: Secondary | ICD-10-CM

## 2022-10-26 DIAGNOSIS — Z8616 Personal history of COVID-19: Secondary | ICD-10-CM | POA: Insufficient documentation

## 2022-10-26 DIAGNOSIS — Z79899 Other long term (current) drug therapy: Secondary | ICD-10-CM | POA: Insufficient documentation

## 2022-10-26 LAB — COMPREHENSIVE METABOLIC PANEL
ALT: 23 U/L (ref 0–44)
AST: 17 U/L (ref 15–41)
Albumin: 3.9 g/dL (ref 3.5–5.0)
Alkaline Phosphatase: 88 U/L (ref 38–126)
Anion gap: 14 (ref 5–15)
BUN: 14 mg/dL (ref 6–20)
CO2: 27 mmol/L (ref 22–32)
Calcium: 9 mg/dL (ref 8.9–10.3)
Chloride: 95 mmol/L — ABNORMAL LOW (ref 98–111)
Creatinine, Ser: 1.03 mg/dL (ref 0.61–1.24)
GFR, Estimated: >60 mL/min (ref >60)
Glucose, Bld: 355 mg/dL — ABNORMAL HIGH (ref 70–99)
Potassium: 4.1 mmol/L (ref 3.5–5.1)
Sodium: 136 mmol/L (ref 135–145)
Total Bilirubin: 0.8 mg/dL (ref 0.3–1.2)
Total Protein: 6.9 g/dL (ref 6.5–8.1)

## 2022-10-26 LAB — IRON AND TIBC
Iron: 106 ug/dL (ref 45–182)
Saturation Ratios: 31 % (ref 17.9–39.5)
TIBC: 337 ug/dL (ref 250–450)
UIBC: 231 ug/dL

## 2022-10-26 LAB — TSH: TSH: 3.879 u[IU]/mL (ref 0.350–4.500)

## 2022-10-26 LAB — CBC
HCT: 39.9 % (ref 39.0–52.0)
Hemoglobin: 13.7 g/dL (ref 13.0–17.0)
MCH: 31.5 pg (ref 26.0–34.0)
MCHC: 34.3 g/dL (ref 30.0–36.0)
MCV: 91.7 fL (ref 80.0–100.0)
Platelets: 291 10*3/uL (ref 150–400)
RBC: 4.35 MIL/uL (ref 4.22–5.81)
RDW: 13.8 % (ref 11.5–15.5)
WBC: 10.4 10*3/uL (ref 4.0–10.5)
nRBC: 0 % (ref 0.0–0.2)

## 2022-10-26 LAB — VITAMIN D 25 HYDROXY (VIT D DEFICIENCY, FRACTURES): Vit D, 25-Hydroxy: 6.95 ng/mL — ABNORMAL LOW (ref 30–100)

## 2022-10-26 LAB — FERRITIN: Ferritin: 107 ng/mL (ref 24–336)

## 2022-10-26 MED ORDER — SPIRONOLACTONE 25 MG PO TABS
25.0000 mg | ORAL_TABLET | Freq: Every day | ORAL | 5 refills | Status: DC
  Filled 2022-10-26 – 2022-12-27 (×2): qty 30, 30d supply, fill #0
  Filled 2023-02-06: qty 30, 30d supply, fill #1
  Filled 2023-03-05: qty 90, 90d supply, fill #2

## 2022-10-26 NOTE — Progress Notes (Signed)
H&V Care Navigation CSW Progress Note  Clinical Social Worker met with pt to check in.  Pt has not set up outpatient mental health visit yet but will plan to do so.  Pt in need of food at this time.  Has pending food stamp application but is awaiting final determination.  CSW provided with Heart and Vascular food bag and list of local pantries.   SDOH Screenings   Food Insecurity: Food Insecurity Present (09/26/2022)  Housing: Medium Risk (01/09/2022)  Transportation Needs: Unmet Transportation Needs (10/25/2022)  Depression (PHQ2-9): High Risk (09/26/2022)  Financial Resource Strain: High Risk (09/26/2022)  Tobacco Use: High Risk (10/26/2022)   Burna Sis, LCSW Clinical Social Worker [facility] Desk#: [phone] Cell#: [phone]

## 2022-10-26 NOTE — Patient Instructions (Addendum)
EKG done today.  RedsClip done today.   Labs done today. We will contact you only if your labs are abnormal.  INCREASE Spironolactone to 25mg  (1 tablet) by mouth daily.   No other medication changes were made. Please continue all current medications as prescribed.  Your physician recommends that you schedule a follow-up appointment in: 1 week for a lab only appointment and in 6 weeks with our NP/PA Clinic and soon for an echo.  Your physician has requested that you have an echocardiogram. Echocardiography is a painless test that uses sound waves to create images of your heart. It provides your doctor with information about the size and shape of your heart and how well your heart's chambers and valves are working. This procedure takes approximately one hour. There are no restrictions for this procedure. Please do NOT wear cologne, perfume, aftershave, or lotions (deodorant is allowed). Please arrive 15 minutes prior to your appointment time.  If you have any questions or concerns before your next appointment please send [place] a message through [place] or call our office at [phone].    TO LEAVE A MESSAGE FOR THE NURSE SELECT OPTION 2, PLEASE LEAVE A MESSAGE INCLUDING: YOUR NAME DATE OF BIRTH CALL BACK NUMBER REASON FOR CALL**this is important as we prioritize the call backs  YOU WILL RECEIVE A CALL BACK THE SAME DAY AS LONG AS YOU CALL BEFORE 4:00 PM   Do the following things EVERYDAY: Weigh yourself in the morning before breakfast. Write it down and keep it in a log. Take your medicines as prescribed Eat low salt foods--Limit salt (sodium) to 2000 mg per day.  Stay as active as you can everyday Limit all fluids for the day to less than 2 liters   At the [facility], you and your health needs are our priority. As part of our continuing mission to provide you with exceptional heart care, we have created designated Provider Care Teams. These Care Teams include your  primary Cardiologist (physician) and Advanced Practice Providers (APPs- Physician Assistants and Nurse Practitioners) who all work together to provide you with the care you need, when you need it.   You may see any of the following providers on your designated Care Team at your next follow up: Dr Arvilla Meres Dr Marca Ancona Dr. Marcos Eke, NP Robbie Lis, [place] [facility], [place] Brynda Peon, NP Karle Plumber, PharmD   Please be sure to bring in all your medications bottles to every appointment.    Thank you for choosing [facility]

## 2022-10-26 NOTE — Addendum Note (Signed)
Encounter addended by: Burna Sis, LCSW on: 10/26/2022 2:55 PM  Actions taken: Clinical Note Signed

## 2022-10-26 NOTE — Progress Notes (Signed)
ReDS Vest / Clip - 10/26/22 1200       ReDS Vest / Clip   Station Marker C    Ruler Value 24    ReDS Value Range Moderate volume overload    ReDS Actual Value 36
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
## 2022-10-30 ENCOUNTER — Other Ambulatory Visit: Payer: Self-pay

## 2022-10-30 ENCOUNTER — Telehealth (HOSPITAL_COMMUNITY): Payer: Self-pay

## 2022-10-30 MED ORDER — VITAMIN D (ERGOCALCIFEROL) 1.25 MG (50000 UNIT) PO CAPS
50000.0000 [IU] | ORAL_CAPSULE | ORAL | 0 refills | Status: DC
  Filled 2022-10-30: qty 4, 28d supply, fill #0
  Filled 2022-12-27: qty 2, 14d supply, fill #1

## 2022-10-30 NOTE — Telephone Encounter (Signed)
Patient advised and verbalized understanding. Rx sent into patients pharmacy.   Meds ordered this encounter  Medications   Vitamin D, Ergocalciferol, (DRISDOL) 1.25 MG (50000 UNIT) CAPS capsule    Sig: Take 1 capsule (50,000 Units total) by mouth once a week.    Dispense:  6 capsule    Refill:  0

## 2022-10-30 NOTE — Telephone Encounter (Signed)
-----   Message from [place] sent at 10/27/2022 11:09 AM EDT ----- Vitamin D level is extremely low. Suspect this is likely contributing to his chronic fatigue.   Start vitamin D (ergocalciferol) 50,000 units (1250 mcg) once weekly for 6 wks and have him follow up w/ PCP.  All other labs ok.

## 2022-11-02 ENCOUNTER — Other Ambulatory Visit (HOSPITAL_COMMUNITY): Payer: Medicaid Other

## 2022-11-02 ENCOUNTER — Other Ambulatory Visit: Payer: Self-pay

## 2022-11-03 ENCOUNTER — Other Ambulatory Visit: Payer: Self-pay

## 2022-11-06 ENCOUNTER — Other Ambulatory Visit: Payer: Self-pay

## 2022-11-10 ENCOUNTER — Ambulatory Visit (HOSPITAL_COMMUNITY): Admission: RE | Admit: 2022-11-10 | Payer: Medicaid Other | Source: Ambulatory Visit

## 2022-11-13 ENCOUNTER — Other Ambulatory Visit: Payer: Self-pay

## 2022-12-08 ENCOUNTER — Telehealth (HOSPITAL_COMMUNITY): Payer: Self-pay

## 2022-12-08 NOTE — Telephone Encounter (Signed)
Called and spoke to patient's wife to confirm/remind patient of their appointment at the [facility] on 8/.   Patient reminded to bring all medications and/or complete list.  Confirmed patient has transportation. Gave directions, instructed to utilize valet parking.  Confirmed appointment prior to ending call.

## 2022-12-11 ENCOUNTER — Encounter (HOSPITAL_COMMUNITY): Payer: Medicaid Other

## 2022-12-27 ENCOUNTER — Other Ambulatory Visit (HOSPITAL_COMMUNITY): Payer: Self-pay | Admitting: Student in an Organized Health Care Education/Training Program

## 2022-12-27 ENCOUNTER — Other Ambulatory Visit (HOSPITAL_COMMUNITY): Payer: Self-pay | Admitting: Family Medicine

## 2022-12-27 ENCOUNTER — Encounter (INDEPENDENT_AMBULATORY_CARE_PROVIDER_SITE_OTHER): Payer: Self-pay | Admitting: Primary Care

## 2022-12-27 ENCOUNTER — Telehealth: Payer: Self-pay

## 2022-12-27 ENCOUNTER — Other Ambulatory Visit: Payer: Self-pay

## 2022-12-27 ENCOUNTER — Ambulatory Visit (INDEPENDENT_AMBULATORY_CARE_PROVIDER_SITE_OTHER): Payer: Self-pay | Admitting: Primary Care

## 2022-12-27 VITALS — BP 148/97 | HR 91 | Resp 16 | Wt 134.8 lb

## 2022-12-27 DIAGNOSIS — F4312 Post-traumatic stress disorder, chronic: Secondary | ICD-10-CM

## 2022-12-27 DIAGNOSIS — F411 Generalized anxiety disorder: Secondary | ICD-10-CM

## 2022-12-27 DIAGNOSIS — Z23 Encounter for immunization: Secondary | ICD-10-CM

## 2022-12-27 DIAGNOSIS — F331 Major depressive disorder, recurrent, moderate: Secondary | ICD-10-CM

## 2022-12-27 DIAGNOSIS — E1069 Type 1 diabetes mellitus with other specified complication: Secondary | ICD-10-CM

## 2022-12-27 MED ORDER — CARVEDILOL 6.25 MG PO TABS
6.2500 mg | ORAL_TABLET | Freq: Two times a day (BID) | ORAL | 5 refills | Status: DC
  Filled 2022-12-27: qty 60, 30d supply, fill #0
  Filled 2023-02-06: qty 60, 30d supply, fill #1
  Filled 2023-03-05: qty 180, 90d supply, fill #2
  Filled 2023-03-21: qty 180, 90d supply, fill #3
  Filled 2023-06-13: qty 60, 30d supply, fill #3

## 2022-12-27 NOTE — Patient Instructions (Signed)

## 2022-12-27 NOTE — Progress Notes (Signed)
[facility]  [name], is a 47 y.o. male  WNU:[id number]  IHK:[id number]  DOB - 05-Jul-1975  No chief complaint on file.      Subjective:   [name] is a 47 y.o. male here today for a follow up visit. T1D. Denies polyuria, polydipsia, polyphasia or vision changes.  Does not check blood sugars at home. Patient has No headache, No chest pain, No abdominal pain - No Nausea, No new weakness tingling or numbness, No Cough - shortness of breath. Problems with paper work from wife hindering medical help.   No problems updated.  Allergies  Allergen Reactions   Shellfish Allergy Anaphylaxis    Past Medical History:  Diagnosis Date   CKD (chronic kidney disease), stage II    Diabetes mellitus without complication (HCC)    type 1   NICM (nonischemic cardiomyopathy) (HCC)    Coronary Ca score 0 and no CAD on coronary CTGA 04/2021   Polysubstance abuse (HCC)     Current Outpatient Medications on File Prior to Visit  Medication Sig Dispense Refill   acetaminophen (TYLENOL) 325 MG tablet Take 650 mg by mouth every 6 (six) hours as needed for moderate pain.     aspirin EC 81 MG tablet Take 1 tablet (81 mg total) by mouth daily. Swallow whole. 30 tablet 5   Blood Glucose Monitoring Suppl (TRUE METRIX METER) w/Device KIT Use kit to check blood glucose up to four times daily as directed 1 kit 0   fluticasone (FLONASE) 50 MCG/ACT nasal spray Place 2 sprays into both nostrils daily.     glucose blood (TRUE METRIX BLOOD GLUCOSE TEST) test strip Use as instructed 100 each 12   Insulin Glargine (BASAGLAR KWIKPEN) 100 UNIT/ML Inject 20 Units into the skin 2 (two) times daily. 9 mL 3   insulin lispro (HUMALOG KWIKPEN) 100 UNIT/ML KwikPen Sliding scale inject 3 times daily with meals. Blood sugars 0-150 give 0 units,201-250:4 units,251-300:6 units,301-350: 8 units,351-400: 10 units,>400 12 units & call M.D. 15 mL 2   Insulin Pen Needle (TRUEPLUS 5-BEVEL PEN NEEDLES) 31G X 8  MM MISC Use as directed 3 (three) times daily before meals. 100 each 11   Insulin Syringe-Needle U-100 (TRUEPLUS INSULIN SYRINGE) 31G X 5/16" 0.3 ML MISC [place] as directed as needed. 100 each 0   Ipratropium-Albuterol (COMBIVENT) 20-100 MCG/ACT AERS respimat Inhale 1 puff by mouth into the lungs every 6 hours as needed for wheezing. 4 g 0   losartan (COZAAR) 25 MG tablet Take 25 mg by mouth daily.     sertraline (ZOLOFT) 50 MG tablet Take 1.5 tablets (75 mg total) by mouth daily. 45 tablet 0   spironolactone (ALDACTONE) 25 MG tablet Take 1 tablet (25 mg total) by mouth daily. 30 tablet 5   TRUEplus Lancets 28G MISC Use as directed up to 4 times daily. 100 each 0   Vitamin D, Ergocalciferol, (DRISDOL) 1.25 MG (50000 UNIT) CAPS capsule Take 1 capsule (50,000 Units total) by mouth once a week. 6 capsule 0   No current facility-administered medications on file prior to visit.    Objective:   Vitals:   12/27/22 1542 12/27/22 1543  BP: (!) 149/90 (!) 148/97  Pulse: 91   Resp: 16   SpO2: 100%   Weight: 134 lb 12.8 oz (61.1 kg)     Comprehensive ROS Pertinent positive and negative noted in HPI   Exam General appearance : Awake, alert, not in any distress. Speech Clear. Not toxic looking HEENT: Atraumatic  and Normocephalic, pupils equally reactive to light and accomodation Neck: Supple, no JVD. No cervical lymphadenopathy.  Chest: Good air entry bilaterally, no added sounds  CVS: S1 S2 regular, no murmurs.  Abdomen: Bowel sounds present, Non tender and not distended with no gaurding, rigidity or rebound. Extremities: B/L Lower Ext shows no edema, both legs are warm to touch Neurology: Awake alert, and oriented X 3, CN II-XII intact, Non focal Skin: No Rash  Data Review Lab Results  Component Value Date   HGBA1C 10.7 (A) 09/26/2022   HGBA1C 9.9 (A) 03/01/2022   HGBA1C 10.0 (A) 11/29/2021    Assessment & Plan  Diagnoses and all orders for this visit:  Type 1 diabetes mellitus with  other specified complication (HCC) - educated on lifestyle modifications, including but not limited to diet choices and adding exercise to daily routine.   -     POCT glycosylated hemoglobin (Hb A1C)  Encounter for immunization -     Flu vaccine trivalent PF, 6mos and older(Flulaval,Afluria,Fluarix,Fluzone)     Patient have been counseled extensively about nutrition and exercise. Other issues discussed during this visit include: low cholesterol diet, weight control and daily exercise, foot care, annual eye examinations at Ophthalmology, importance of adherence with medications and regular follow-up. We also discussed long term complications of uncontrolled diabetes and hypertension.   Return in about 3 months (around 03/28/2023).  The patient was given clear instructions to go to ER or return to medical center if symptoms don't improve, worsen or new problems develop. The patient verbalized understanding. The patient was told to call to get lab results if they haven't heard anything in the next week.   This note has been created with Education officer, environmental. Any transcriptional errors are unintentional.   Grayce Sessions, NP 12/31/2022, 8:53 PM

## 2022-12-27 NOTE — Telephone Encounter (Signed)
Follow up from discussion with Isaac Passe, NP.  Per Cathlyn Parsons, DSS eligibility case worker, reports that the patient has been approved for [facility] and they have informed him.  Per Marcelino Duster, he has already applied for disability and he receives $290/month food stamps.

## 2022-12-29 ENCOUNTER — Other Ambulatory Visit: Payer: Self-pay

## 2022-12-29 ENCOUNTER — Other Ambulatory Visit (HOSPITAL_COMMUNITY): Payer: Self-pay | Admitting: Primary Care

## 2022-12-29 DIAGNOSIS — F331 Major depressive disorder, recurrent, moderate: Secondary | ICD-10-CM

## 2022-12-29 DIAGNOSIS — F4312 Post-traumatic stress disorder, chronic: Secondary | ICD-10-CM

## 2022-12-29 DIAGNOSIS — F411 Generalized anxiety disorder: Secondary | ICD-10-CM

## 2022-12-29 NOTE — Telephone Encounter (Signed)
Requested medication (s) are due for refill today: yes  Requested medication (s) are on the active medication list: yes  Last refill:  04/04/22  Future visit scheduled: yes  Notes to clinic:  Unable to refill per protocol, last refill by another provider. Routing to PCP for approval, patient last OV 12/27/22.     Requested Prescriptions  Pending Prescriptions Disp Refills   sertraline (ZOLOFT) 50 MG tablet 45 tablet 0    Sig: Take 1.5 tablets (75 mg total) by mouth daily.     There is no refill protocol information for this order

## 2022-12-29 NOTE — Progress Notes (Signed)
[name] 30-Oct-1975 [id number]  Patient attempted to be outreached by Thomasene Ripple, PharmD Candidate to discuss hypertension.   Thomasene Ripple, Student-PharmD

## 2023-01-01 ENCOUNTER — Other Ambulatory Visit: Payer: Self-pay

## 2023-01-01 ENCOUNTER — Other Ambulatory Visit (HOSPITAL_COMMUNITY): Payer: Self-pay | Admitting: Student in an Organized Health Care Education/Training Program

## 2023-01-01 DIAGNOSIS — F411 Generalized anxiety disorder: Secondary | ICD-10-CM

## 2023-01-01 DIAGNOSIS — F331 Major depressive disorder, recurrent, moderate: Secondary | ICD-10-CM

## 2023-01-01 DIAGNOSIS — F4312 Post-traumatic stress disorder, chronic: Secondary | ICD-10-CM

## 2023-01-01 LAB — POCT GLYCOSYLATED HEMOGLOBIN (HGB A1C): HbA1c, POC (controlled diabetic range): 9.9 % — AB (ref 0.0–7.0)

## 2023-01-05 ENCOUNTER — Other Ambulatory Visit: Payer: Self-pay

## 2023-01-05 ENCOUNTER — Encounter: Payer: Self-pay | Admitting: Pharmacist

## 2023-01-16 ENCOUNTER — Other Ambulatory Visit (HOSPITAL_COMMUNITY): Payer: Self-pay | Admitting: Student in an Organized Health Care Education/Training Program

## 2023-01-16 ENCOUNTER — Other Ambulatory Visit (INDEPENDENT_AMBULATORY_CARE_PROVIDER_SITE_OTHER): Payer: Self-pay | Admitting: Primary Care

## 2023-01-16 DIAGNOSIS — F4312 Post-traumatic stress disorder, chronic: Secondary | ICD-10-CM

## 2023-01-16 DIAGNOSIS — E1069 Type 1 diabetes mellitus with other specified complication: Secondary | ICD-10-CM

## 2023-01-16 DIAGNOSIS — F411 Generalized anxiety disorder: Secondary | ICD-10-CM

## 2023-01-16 DIAGNOSIS — F331 Major depressive disorder, recurrent, moderate: Secondary | ICD-10-CM

## 2023-01-17 ENCOUNTER — Other Ambulatory Visit: Payer: Self-pay

## 2023-01-17 MED ORDER — BASAGLAR KWIKPEN 100 UNIT/ML ~~LOC~~ SOPN
20.0000 [IU] | PEN_INJECTOR | Freq: Two times a day (BID) | SUBCUTANEOUS | 2 refills | Status: DC
  Filled 2023-01-17: qty 9, 23d supply, fill #0
  Filled 2023-03-05: qty 9, 23d supply, fill #1
  Filled 2023-03-21: qty 9, 23d supply, fill #2

## 2023-01-17 NOTE — Telephone Encounter (Signed)
Requested Prescriptions  Pending Prescriptions Disp Refills   Insulin Glargine (BASAGLAR KWIKPEN) 100 UNIT/ML 9 mL 3    Sig: Inject 20 Units into the skin 2 (two) times daily.     Endocrinology:  Diabetes - Insulins Failed - 01/16/2023  7:24 PM      Failed - HBA1C is between 0 and 7.9 and within 180 days    HbA1c, POC (controlled diabetic range)  Date Value Ref Range Status  12/27/2022 9.9 (A) 0.0 - 7.0 % Final         Passed - Valid encounter within last 6 months    Recent Outpatient Visits           3 weeks ago Type 1 diabetes mellitus with other specified complication (HCC)   [place] [facility] Grayce Sessions, NP   3 months ago Type 1 diabetes mellitus with other specified complication [facility])   [place] [facility] Grayce Sessions, NP   10 months ago Type 1 diabetes mellitus with other specified complication [facility])   [place] [facility] Grayce Sessions, NP   1 year ago Type 1 diabetes mellitus with other specified complication [facility])   [place] [facility] Grayce Sessions, NP   1 year ago Adjustment reaction with anxiety and depression   [place] [facility] Grayce Sessions, NP       Future Appointments             In 2 months Randa Evens, Kinnie Scales, NP [place] [facility]

## 2023-01-20 ENCOUNTER — Emergency Department (HOSPITAL_COMMUNITY): Payer: Self-pay

## 2023-01-20 ENCOUNTER — Emergency Department (HOSPITAL_COMMUNITY)
Admission: EM | Admit: 2023-01-20 | Discharge: 2023-01-20 | Disposition: A | Discharge status: Home / Self Care | Payer: Self-pay | Attending: Emergency Medicine | Admitting: Emergency Medicine

## 2023-01-20 ENCOUNTER — Other Ambulatory Visit: Payer: Self-pay

## 2023-01-20 ENCOUNTER — Encounter (HOSPITAL_COMMUNITY): Payer: Self-pay

## 2023-01-20 DIAGNOSIS — Z5329 Procedure and treatment not carried out because of patient's decision for other reasons: Secondary | ICD-10-CM | POA: Diagnosis not present

## 2023-01-20 DIAGNOSIS — Z7982 Long term (current) use of aspirin: Secondary | ICD-10-CM | POA: Insufficient documentation

## 2023-01-20 DIAGNOSIS — Z794 Long term (current) use of insulin: Secondary | ICD-10-CM | POA: Insufficient documentation

## 2023-01-20 DIAGNOSIS — N189 Chronic kidney disease, unspecified: Secondary | ICD-10-CM | POA: Insufficient documentation

## 2023-01-20 DIAGNOSIS — R079 Chest pain, unspecified: Secondary | ICD-10-CM | POA: Insufficient documentation

## 2023-01-20 DIAGNOSIS — Y92410 Unspecified street and highway as the place of occurrence of the external cause: Secondary | ICD-10-CM | POA: Insufficient documentation

## 2023-01-20 DIAGNOSIS — E1022 Type 1 diabetes mellitus with diabetic chronic kidney disease: Secondary | ICD-10-CM | POA: Insufficient documentation

## 2023-01-20 LAB — COMPREHENSIVE METABOLIC PANEL
ALT: 17 U/L (ref 0–44)
AST: 17 U/L (ref 15–41)
Albumin: 3.6 g/dL (ref 3.5–5.0)
Alkaline Phosphatase: 74 U/L (ref 38–126)
Anion gap: 10 (ref 5–15)
BUN: 14 mg/dL (ref 6–20)
CO2: 26 mmol/L (ref 22–32)
Calcium: 8.6 mg/dL — ABNORMAL LOW (ref 8.9–10.3)
Chloride: 99 mmol/L (ref 98–111)
Creatinine, Ser: 1.15 mg/dL (ref 0.61–1.24)
GFR, Estimated: >60 mL/min (ref >60)
Glucose, Bld: 301 mg/dL — ABNORMAL HIGH (ref 70–99)
Potassium: 3.9 mmol/L (ref 3.5–5.1)
Sodium: 135 mmol/L (ref 135–145)
Total Bilirubin: 0.7 mg/dL (ref 0.3–1.2)
Total Protein: 6.6 g/dL (ref 6.5–8.1)

## 2023-01-20 LAB — CBC
HCT: 36.7 % — ABNORMAL LOW (ref 39.0–52.0)
Hemoglobin: 12.3 g/dL — ABNORMAL LOW (ref 13.0–17.0)
MCH: 30.2 pg (ref 26.0–34.0)
MCHC: 33.5 g/dL (ref 30.0–36.0)
MCV: 90.2 fL (ref 80.0–100.0)
Platelets: 274 10*3/uL (ref 150–400)
RBC: 4.07 MIL/uL — ABNORMAL LOW (ref 4.22–5.81)
RDW: 13.4 % (ref 11.5–15.5)
WBC: 9.4 10*3/uL (ref 4.0–10.5)
nRBC: 0 % (ref 0.0–0.2)

## 2023-01-20 LAB — I-STAT CHEM 8, ED
BUN: 15 mg/dL (ref 6–20)
Calcium, Ion: 1.07 mmol/L — ABNORMAL LOW (ref 1.15–1.40)
Chloride: 99 mmol/L (ref 98–111)
Creatinine, Ser: 1 mg/dL (ref 0.61–1.24)
Glucose, Bld: 297 mg/dL — ABNORMAL HIGH (ref 70–99)
HCT: 37 % — ABNORMAL LOW (ref 39.0–52.0)
Hemoglobin: 12.6 g/dL — ABNORMAL LOW (ref 13.0–17.0)
Potassium: 4 mmol/L (ref 3.5–5.1)
Sodium: 136 mmol/L (ref 135–145)
TCO2: 24 mmol/L (ref 22–32)

## 2023-01-20 LAB — LIPASE, BLOOD: Lipase: 18 U/L (ref 11–51)

## 2023-01-20 MED ORDER — IOHEXOL 350 MG/ML SOLN
75.0000 mL | Freq: Once | INTRAVENOUS | Status: AC | PRN
  Administered 2023-01-20: 75 mL via INTRAVENOUS

## 2023-01-20 MED ORDER — IBUPROFEN 800 MG PO TABS
800.0000 mg | ORAL_TABLET | Freq: Once | ORAL | Status: AC
  Administered 2023-01-20: 800 mg via ORAL
  Filled 2023-01-20: qty 1

## 2023-01-20 NOTE — ED Notes (Signed)
Pt's family came out angry that they did not have pain medication. Doc was informed via secure chat.
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
## 2023-01-23 ENCOUNTER — Other Ambulatory Visit: Payer: Self-pay

## 2023-01-29 ENCOUNTER — Other Ambulatory Visit: Payer: Self-pay

## 2023-02-06 ENCOUNTER — Other Ambulatory Visit: Payer: Self-pay

## 2023-03-05 ENCOUNTER — Other Ambulatory Visit: Payer: Self-pay

## 2023-03-21 ENCOUNTER — Other Ambulatory Visit: Payer: Self-pay

## 2023-03-28 ENCOUNTER — Ambulatory Visit (INDEPENDENT_AMBULATORY_CARE_PROVIDER_SITE_OTHER): Payer: Self-pay | Admitting: Primary Care

## 2023-04-04 ENCOUNTER — Ambulatory Visit (INDEPENDENT_AMBULATORY_CARE_PROVIDER_SITE_OTHER): Payer: Medicaid Other | Admitting: Primary Care

## 2023-04-04 ENCOUNTER — Encounter (INDEPENDENT_AMBULATORY_CARE_PROVIDER_SITE_OTHER): Payer: Self-pay

## 2023-04-16 ENCOUNTER — Other Ambulatory Visit: Payer: Self-pay

## 2023-04-16 ENCOUNTER — Other Ambulatory Visit (INDEPENDENT_AMBULATORY_CARE_PROVIDER_SITE_OTHER): Payer: Self-pay | Admitting: Primary Care

## 2023-04-16 DIAGNOSIS — E1069 Type 1 diabetes mellitus with other specified complication: Secondary | ICD-10-CM

## 2023-05-02 ENCOUNTER — Other Ambulatory Visit (INDEPENDENT_AMBULATORY_CARE_PROVIDER_SITE_OTHER): Payer: Self-pay | Admitting: Primary Care

## 2023-05-02 ENCOUNTER — Other Ambulatory Visit: Payer: Self-pay

## 2023-05-02 DIAGNOSIS — E1069 Type 1 diabetes mellitus with other specified complication: Secondary | ICD-10-CM

## 2023-05-02 MED ORDER — INSULIN LISPRO (1 UNIT DIAL) 100 UNIT/ML (KWIKPEN)
PEN_INJECTOR | SUBCUTANEOUS | 0 refills | Status: DC
  Filled 2023-05-02: qty 9, 30d supply, fill #0

## 2023-05-02 MED ORDER — BASAGLAR KWIKPEN 100 UNIT/ML ~~LOC~~ SOPN
20.0000 [IU] | PEN_INJECTOR | Freq: Two times a day (BID) | SUBCUTANEOUS | 0 refills | Status: DC
  Filled 2023-05-02: qty 9, 23d supply, fill #0

## 2023-05-02 NOTE — Telephone Encounter (Signed)
 Pt no showed appt 04-04-23 and doesn't have an appt coming up Pt last A1c was 9.9 dated 12-27-22 Will forward to provider. Please refill if appropriate   [name] please reach out to pt to schedule a follow up

## 2023-05-03 ENCOUNTER — Other Ambulatory Visit: Payer: Self-pay

## 2023-05-15 ENCOUNTER — Ambulatory Visit (INDEPENDENT_AMBULATORY_CARE_PROVIDER_SITE_OTHER): Payer: Medicaid Other | Admitting: Primary Care

## 2023-05-24 ENCOUNTER — Ambulatory Visit (INDEPENDENT_AMBULATORY_CARE_PROVIDER_SITE_OTHER): Payer: Medicaid Other | Admitting: Primary Care

## 2023-05-31 ENCOUNTER — Other Ambulatory Visit: Payer: Self-pay

## 2023-05-31 ENCOUNTER — Ambulatory Visit (INDEPENDENT_AMBULATORY_CARE_PROVIDER_SITE_OTHER): Payer: Self-pay | Admitting: Primary Care

## 2023-05-31 ENCOUNTER — Encounter (INDEPENDENT_AMBULATORY_CARE_PROVIDER_SITE_OTHER): Payer: Self-pay | Admitting: Primary Care

## 2023-05-31 VITALS — BP 132/76 | HR 94 | Resp 16 | Wt 132.6 lb

## 2023-05-31 DIAGNOSIS — F4323 Adjustment disorder with mixed anxiety and depressed mood: Secondary | ICD-10-CM

## 2023-05-31 DIAGNOSIS — Z794 Long term (current) use of insulin: Secondary | ICD-10-CM

## 2023-05-31 DIAGNOSIS — I10 Essential (primary) hypertension: Secondary | ICD-10-CM

## 2023-05-31 DIAGNOSIS — E1069 Type 1 diabetes mellitus with other specified complication: Secondary | ICD-10-CM

## 2023-05-31 DIAGNOSIS — R7989 Other specified abnormal findings of blood chemistry: Secondary | ICD-10-CM

## 2023-05-31 DIAGNOSIS — Z1211 Encounter for screening for malignant neoplasm of colon: Secondary | ICD-10-CM

## 2023-05-31 LAB — POCT GLYCOSYLATED HEMOGLOBIN (HGB A1C): HbA1c, POC (controlled diabetic range): 11.1 % — AB (ref 0.0–7.0)

## 2023-05-31 MED ORDER — BASAGLAR KWIKPEN 100 UNIT/ML ~~LOC~~ SOPN
30.0000 [IU] | PEN_INJECTOR | Freq: Every day | SUBCUTANEOUS | 3 refills | Status: DC
  Filled 2023-05-31 – 2023-06-13 (×2): qty 15, 50d supply, fill #0
  Filled 2023-08-16: qty 9, 30d supply, fill #1
  Filled 2023-09-04 – 2023-09-17 (×2): qty 9, 30d supply, fill #2
  Filled 2023-10-29 (×2): qty 9, 30d supply, fill #3
  Filled 2023-11-27: qty 9, 30d supply, fill #4

## 2023-05-31 MED ORDER — INSULIN LISPRO (1 UNIT DIAL) 100 UNIT/ML (KWIKPEN)
10.0000 [IU] | PEN_INJECTOR | Freq: Every day | SUBCUTANEOUS | 3 refills | Status: DC
  Filled 2023-05-31: qty 9, 30d supply, fill #0
  Filled 2023-06-13: qty 3, 28d supply, fill #0
  Filled 2023-07-16 – 2023-08-16 (×2): qty 3, 28d supply, fill #1
  Filled 2023-09-17: qty 3, 28d supply, fill #2
  Filled 2023-10-29 (×2): qty 3, 28d supply, fill #3

## 2023-05-31 MED ORDER — LOSARTAN POTASSIUM 25 MG PO TABS
25.0000 mg | ORAL_TABLET | Freq: Every day | ORAL | 1 refills | Status: DC
  Filled 2023-05-31 – 2023-06-13 (×2): qty 90, 90d supply, fill #0
  Filled 2023-09-17: qty 90, 90d supply, fill #1

## 2023-05-31 MED ORDER — BASAGLAR KWIKPEN 100 UNIT/ML ~~LOC~~ SOPN
20.0000 [IU] | PEN_INJECTOR | Freq: Two times a day (BID) | SUBCUTANEOUS | 0 refills | Status: DC
  Filled 2023-05-31: qty 9, 23d supply, fill #0

## 2023-05-31 NOTE — Progress Notes (Signed)
[facility]  [name], is a 48 y.o. male  ZOX:[id number]  [id number]:[id number]  DOB - May 03, 1975  Chief Complaint  Patient presents with   Diabetes   Hypertension       Subjective:   [name] is a 48 y.o. male here today for a follow up visit. Patient has No headache, No chest pain, No abdominal pain - No Nausea, No new weakness tingling or numbness, No Cough - shortness of breath Diabetes He presents for his follow-up diabetic visit. He has type 1 diabetes mellitus. No MedicAlert identification noted. The initial diagnosis of diabetes was made 26 years ago. His disease course has been worsening. Hypoglycemia symptoms include mood changes and nervousness/anxiousness. Associated symptoms include blurred vision, polydipsia, polyphagia, polyuria and visual change. There are no hypoglycemic complications. Symptoms are worsening. There are no diabetic complications. Risk factors for coronary artery disease include diabetes mellitus, dyslipidemia, male sex and stress. Current diabetic treatment includes insulin injections. He is compliant with treatment some of the time. His weight is stable. He is following a generally healthy diet. When asked about meal planning, he reported none. He has not had a previous visit with a dietitian. He participates in exercise three times a week. An ACE inhibitor/angiotensin II receptor blocker is not being taken. He does not see a podiatrist.Eye exam is not current.  HTN is elevated out of meds  No problems updated.  Comprehensive ROS Pertinent positive and negative noted in HPI   Allergies  Allergen Reactions   Shellfish Allergy Anaphylaxis    Past Medical History:  Diagnosis Date   CKD (chronic kidney disease), stage II    Diabetes mellitus without complication (HCC)    type 1   NICM (nonischemic cardiomyopathy) (HCC)    Coronary Ca score 0 and no CAD on coronary CTGA 04/2021   Polysubstance abuse (HCC)     Current  Outpatient Medications on File Prior to Visit  Medication Sig Dispense Refill   acetaminophen (TYLENOL) 325 MG tablet Take 650 mg by mouth every 6 (six) hours as needed for moderate pain.     aspirin EC 81 MG tablet Take 1 tablet (81 mg total) by mouth daily. Swallow whole. 30 tablet 5   Blood Glucose Monitoring Suppl (TRUE METRIX METER) w/Device KIT Use kit to check blood glucose up to four times daily as directed 1 kit 0   carvedilol (COREG) 6.25 MG tablet Take 1 tablet (6.25 mg total) by mouth 2 (two) times daily. 60 tablet 5   fluticasone (FLONASE) 50 MCG/ACT nasal spray Place 2 sprays into both nostrils daily.     glucose blood (TRUE METRIX BLOOD GLUCOSE TEST) test strip Use as instructed 100 each 12   Insulin Pen Needle (TRUEPLUS 5-BEVEL PEN NEEDLES) 31G X 8 MM MISC Use as directed 3 (three) times daily before meals. 100 each 11   Insulin Syringe-Needle U-100 (TRUEPLUS INSULIN SYRINGE) 31G X 5/16" 0.3 ML MISC [place] as directed as needed. 100 each 0   Ipratropium-Albuterol (COMBIVENT) 20-100 MCG/ACT AERS respimat Inhale 1 puff by mouth into the lungs every 6 hours as needed for wheezing. 4 g 0   sertraline (ZOLOFT) 50 MG tablet Take 1.5 tablets (75 mg total) by mouth daily. 45 tablet 0   TRUEplus Lancets 28G MISC Use as directed up to 4 times daily. 100 each 0   Vitamin D, Ergocalciferol, (DRISDOL) 1.25 MG (50000 UNIT) CAPS capsule Take 1 capsule (50,000 Units total) by mouth once a week. 6 capsule 0  No current facility-administered medications on file prior to visit.   Health Maintenance  Topic Date Due   Eye exam for diabetics  Never done   Pneumococcal Vaccination (2 of 2 - PCV) 12/30/2014   Colon Cancer Screening  Never done   Yearly kidney health urinalysis for diabetes  01/29/2019   Complete foot exam   03/01/2022   COVID-19 Vaccine (1 - 2024-25 season) Never done   Hemoglobin A1C  11/28/2023   Yearly kidney function blood test for diabetes  01/20/2024   DTaP/Tdap/Td vaccine (2 -  Td or Tdap) 01/29/2028   Flu Shot  Completed   Hepatitis C Screening  Completed   HIV Screening  Completed   HPV Vaccine  Aged Out    Objective:   Vitals:   05/31/23 1608 05/31/23 1609 05/31/23 1708  BP: (!) 160/89 (!) 145/83 132/76  Pulse: 94    Resp: 16    SpO2: 100%    Weight: 132 lb 9.6 oz (60.1 kg)     BP Readings from Last 3 Encounters:  05/31/23 132/76  01/20/23 (!) 130/102  12/27/22 (!) 148/97        Assessment & Plan  [name] was seen today for diabetes and hypertension.  Diagnoses and all orders for this visit:  Type 1 diabetes mellitus with other specified complication (HCC) -     POCT glycosylated hemoglobin (Hb A1C) -     Microalbumin / creatinine urine ratio -     CBC with Differential/Platelet -     Discontinue: Insulin Glargine (BASAGLAR KWIKPEN) 100 UNIT/ML; Inject 20 Units into the skin 2 (two) times daily. -     Insulin Glargine (BASAGLAR KWIKPEN) 100 UNIT/ML; Inject 30 Units into the skin daily. -     insulin lispro (HUMALOG KWIKPEN) 100 UNIT/ML KwikPen; Take 10u before meals  Essential hypertension BP goal - < 130/80 Explained that having normal blood pressure is the goal and medications are helping to get to goal and maintain normal blood pressure. DIET: Limit salt intake, read nutrition labels to check salt content, limit fried and high fatty foods  Avoid using multisymptom OTC cold preparations that generally contain sudafed which can rise BP. Consult with pharmacist on best cold relief products to use for persons with HTN EXERCISE Discussed incorporating exercise such as walking - 30 minutes most days of the week and can do in 10 minute intervals    -     CMP14+EGFR -     losartan (COZAAR) 25 MG tablet; Take 1 tablet (25 mg total) by mouth daily.  Elevated LFTs -     Lipid panel  Colon cancer screening -     Ambulatory referral to Gastroenterology  Adjustment reaction with anxiety and depression Refer to [facility] -     Ambulatory referral  to Psychiatry      Patient have been counseled extensively about nutrition and exercise. Other issues discussed during this visit include: low cholesterol diet, weight control and daily exercise, foot care, annual eye examinations at Ophthalmology, importance of adherence with medications and regular follow-up. We also discussed long term complications of uncontrolled diabetes and hypertension.   Return in about 3 months (around 08/28/2023) for fasting labs, DM.  The patient was given clear instructions to go to ER or return to medical center if symptoms don't improve, worsen or new problems develop. The patient verbalized understanding. The patient was told to call to get lab results if they haven't heard anything in the next week.  This note has been created with Education officer, environmental. Any transcriptional errors are unintentional.   Grayce Sessions, NP 05/31/2023, 5:12 PM

## 2023-06-01 ENCOUNTER — Other Ambulatory Visit: Payer: Self-pay

## 2023-06-01 LAB — CBC WITH DIFFERENTIAL/PLATELET
Basophils Absolute: 0.1 10*3/uL (ref 0.0–0.2)
Basos: 1 %
EOS (ABSOLUTE): 0.1 10*3/uL (ref 0.0–0.4)
Eos: 1 %
Hematocrit: 39.8 % (ref 37.5–51.0)
Hemoglobin: 13.4 g/dL (ref 13.0–17.7)
Immature Grans (Abs): 0 10*3/uL (ref 0.0–0.1)
Immature Granulocytes: 0 %
Lymphocytes Absolute: 3.5 10*3/uL — ABNORMAL HIGH (ref 0.7–3.1)
Lymphs: 39 %
MCH: 31 pg (ref 26.6–33.0)
MCHC: 33.7 g/dL (ref 31.5–35.7)
MCV: 92 fL (ref 79–97)
Monocytes Absolute: 0.7 10*3/uL (ref 0.1–0.9)
Monocytes: 8 %
Neutrophils Absolute: 4.5 10*3/uL (ref 1.4–7.0)
Neutrophils: 51 %
Platelets: 302 10*3/uL (ref 150–450)
RBC: 4.32 x10E6/uL (ref 4.14–5.80)
RDW: 13.1 % (ref 11.6–15.4)
WBC: 8.9 10*3/uL (ref 3.4–10.8)

## 2023-06-01 LAB — MICROALBUMIN / CREATININE URINE RATIO
Creatinine, Urine: 55.4 mg/dL
Microalb/Creat Ratio: 14 mg/g{creat} (ref 0–29)
Microalbumin, Urine: 7.6 ug/mL

## 2023-06-01 LAB — LIPID PANEL
Chol/HDL Ratio: 2.5 {ratio} (ref 0.0–5.0)
Cholesterol, Total: 131 mg/dL (ref 100–199)
HDL: 52 mg/dL (ref >39)
LDL Chol Calc (NIH): 61 mg/dL (ref 0–99)
Triglycerides: 98 mg/dL (ref 0–149)
VLDL Cholesterol Cal: 18 mg/dL (ref 5–40)

## 2023-06-01 LAB — CMP14+EGFR
ALT: 23 [IU]/L (ref 0–44)
AST: 24 [IU]/L (ref 0–40)
Albumin: 4.4 g/dL (ref 4.1–5.1)
Alkaline Phosphatase: 151 [IU]/L — ABNORMAL HIGH (ref 44–121)
BUN/Creatinine Ratio: 16 (ref 9–20)
BUN: 16 mg/dL (ref 6–24)
Bilirubin Total: 0.2 mg/dL (ref 0.0–1.2)
CO2: 25 mmol/L (ref 20–29)
Calcium: 9 mg/dL (ref 8.7–10.2)
Chloride: 97 mmol/L (ref 96–106)
Creatinine, Ser: 0.97 mg/dL (ref 0.76–1.27)
Globulin, Total: 2.3 g/dL (ref 1.5–4.5)
Glucose: 428 mg/dL — ABNORMAL HIGH (ref 70–99)
Potassium: 4.7 mmol/L (ref 3.5–5.2)
Sodium: 136 mmol/L (ref 134–144)
Total Protein: 6.7 g/dL (ref 6.0–8.5)
eGFR: 96 mL/min/{1.73_m2} (ref >59)

## 2023-06-05 ENCOUNTER — Encounter (INDEPENDENT_AMBULATORY_CARE_PROVIDER_SITE_OTHER): Payer: Self-pay | Admitting: Primary Care

## 2023-06-11 ENCOUNTER — Other Ambulatory Visit: Payer: Self-pay

## 2023-06-12 ENCOUNTER — Other Ambulatory Visit: Payer: Self-pay

## 2023-06-13 ENCOUNTER — Other Ambulatory Visit: Payer: Self-pay

## 2023-06-13 ENCOUNTER — Telehealth (INDEPENDENT_AMBULATORY_CARE_PROVIDER_SITE_OTHER): Payer: Self-pay | Admitting: Licensed Clinical Social Worker

## 2023-06-13 NOTE — Telephone Encounter (Signed)
 LCSWA called patient today to introduce herself and to assess patients' mental health needs. Patient wife did answer the phone. LCSWA Intern was able to leave a brief message with the patients wife asking them to return the call. Patient was referred by PCP for anxiety and depression

## 2023-06-14 ENCOUNTER — Other Ambulatory Visit: Payer: Self-pay

## 2023-07-02 ENCOUNTER — Other Ambulatory Visit (HOSPITAL_COMMUNITY): Payer: Self-pay

## 2023-07-02 ENCOUNTER — Other Ambulatory Visit: Payer: Self-pay

## 2023-07-02 MED ORDER — IBUPROFEN 600 MG PO TABS
600.0000 mg | ORAL_TABLET | Freq: Four times a day (QID) | ORAL | 0 refills | Status: AC | PRN
  Filled 2023-07-02: qty 30, 8d supply, fill #0

## 2023-07-02 MED ORDER — HYDROCODONE-ACETAMINOPHEN 5-325 MG PO TABS
1.0000 | ORAL_TABLET | Freq: Four times a day (QID) | ORAL | 0 refills | Status: DC | PRN
Start: 2023-07-02 — End: 2023-12-06
  Filled 2023-07-02: qty 12, 3d supply, fill #0

## 2023-07-02 MED ORDER — AMOXICILLIN 500 MG PO CAPS
500.0000 mg | ORAL_CAPSULE | Freq: Three times a day (TID) | ORAL | 0 refills | Status: DC
  Filled 2023-07-02 (×2): qty 21, 7d supply, fill #0

## 2023-07-16 ENCOUNTER — Other Ambulatory Visit: Payer: Self-pay

## 2023-07-16 ENCOUNTER — Other Ambulatory Visit (HOSPITAL_COMMUNITY): Payer: Self-pay | Admitting: Family Medicine

## 2023-07-16 MED ORDER — CARVEDILOL 6.25 MG PO TABS
6.2500 mg | ORAL_TABLET | Freq: Two times a day (BID) | ORAL | 0 refills | Status: DC
  Filled 2023-07-16 – 2023-10-29 (×3): qty 60, 30d supply, fill #0

## 2023-07-25 ENCOUNTER — Other Ambulatory Visit: Payer: Self-pay

## 2023-07-30 ENCOUNTER — Encounter (HOSPITAL_COMMUNITY): Payer: Self-pay

## 2023-07-30 NOTE — Progress Notes (Deleted)
 Psychiatric Initial Adult Assessment  Date: 08/01/2023, 4:21 PM  Patient Identification: [name] "[name]" MRN: [id number] DOB: 09-Jul-1975  Referral Source: Marius Siemens, NP ***  ASSESSMENT / PLAN  [name] "[name]" is a 48 y.o. male with PMH of type 1 diabetes mellitus, vitamin D deficiency, hypertension, , *** suicide attempt, *** inpt psych admission, who presented in person for psychiatric evaluation of  depression and anxiety. Patient was last seen in clinic by Dr. Jann Melody on 10/13/21.   Risk Assessment A suicide and violence risk assessment was performed as part of this evaluation. There patient is deemed to be at chronic elevated risk for self-harm/suicide given the following factors: {SABSUICIDERISKFACTORS:29780}. These risk factors are mitigated by the following factors: {SABSUICIDEPROTECTIVEFACTORS:29779}. The patient is deemed to be at chronic elevated risk for violence given the following factors: {SABVIOLENCERISKFACTORS:29781}. These risk factors are mitigated by the following factors: {SABVIOLENCEPROTECTIVEFACTORS:29782}. There is no *** acute risk for suicide or violence at this time. The patient was educated about relevant modifiable risk factors including following recommendations for treatment of psychiatric illness and abstaining from substance abuse.  While future psychiatric events cannot be accurately predicted, the patient does not *** currently require  acute inpatient psychiatric care and does not *** currently meet [place]  involuntary commitment criteria.    There are no diagnoses linked to this encounter.  Follow-up on: Visit date not found  Future Appointments  Date Time Provider Department Center  08/01/2023  1:00 PM Augusta Blizzard, MD [facility] None     Patient was given contact information for behavioral health clinic and was instructed to call 911 for emergencies.     HISTORY OF PRESENT ILLNESS  Chief Complaint: No chief complaint on  file.    ***  Patient amenable to *** after discussing the risks, benefits, and side effects. Otherwise patient had no other questions or concerns and was amenable to plan per above.  Patient's main concern: ***  Patient's goal: ***  Safety: ***. Patient contracted to safety, would call ***. Patient *** aware of [facility], 988 and 911 as well.  *** access to guns or weapons.  Current outpatient therapist: *** Current rx: ***  ROS   PSYCH ROS  Depression: *** depressed mood and pervasive sadness, anhedonia, insomnia***, hypersomnia***, guilt, decreased energy, decreased concentration, decreased*** or increased*** appetite, psychomotor slowing*** restlessness***, and suicidal ideation or intentions Duration of Depression Symptoms: No data recorded (Hypo-) Mania: *** excessive energy despite decreased need for sleep or persistent irritability (<2hr/night x4-7days), grandiosity/inflated self-esteem, sexual indiscretion, racing thoughts, pressured speech, distractibility/inattention. Anxiety: *** having difficulty controlling/managing anxiety/worry/stress and that it is out of proportion with stressors. *** associated sxs of restlessness, being on edge, easily fatigued, concentration difficulty, irritability, muscle tension, sleep disturbance.  Psychosis:  *** AVH, delusions, paranoia, first rank symptoms.  Trauma:  Trauma: *** life-threatening, physical, sexual, witnessed *** flashbacks, nightmares, hypervigilance/hyperarousal, avoidance.  Acute stress d/o 0-3d *** Adjustment d/o 3d-72mo PTSD >36mo  Eating: ***  *** intense fear of gaining weight, perception of being fat, restricting to lose weight. *** overeating in one sitting, unable to stop eating or losing track of time while eating, compensates by restricting, over-exercising, vomiting, laxative misuse, diuretic misuse.   Mild: 1 to 3 episodes of inappropriate compensatory behaviours per week. Moderate: 4 to 7 episodes   Severe: 8 to 13 episodes  Extreme: 14 or more episodes   S: Do you ever make yourself sick because you feel uncomfortably full? C: Do you worry you have lost control over  how much you eat? O: Have you recently lost more than one stone [14 pounds/6.4kg] in a 3 month period? F: Do you believe yourself to be fat when others say you are too thin? F: Would you say that food dominates your life?   PAST HISTORY   Past Psychiatric History:  Hospitalizations: *** Suicide attempts: *** NSSIB: *** Psychotherapy: *** Dx: *** Rx: *** Head trauma: ***  Past Medical History: Dx:  has a past medical history of CKD (chronic kidney disease), stage II, Diabetes mellitus without complication (HCC), NICM (nonischemic cardiomyopathy) (HCC), and Polysubstance abuse (HCC).  Head trauma: *** Seizures: *** Allergies: Shellfish allergy   Family Psychiatric History:  Suicide: *** Homicide: *** Hospitalization: *** BiPD: *** SCZ/SCzA: *** Others: ***  Social History:  Living with: *** Income: *** Marital Status: *** Children: *** Support: *** Guns/Weapons: *** Legal: *** DUI/DWI: *** Jail/prison: *** Developmental: ***  Substance Use History: EtOH:  reports current alcohol use of about 40.0 standard drinks of alcohol per week.*** Nicotine:  reports that he has been smoking cigarettes. He has a 10 pack-year smoking history. He has never used smokeless tobacco.*** THC/CBD: *** IV drug use: *** Stimulants: *** Opiates: *** Sedative/hypnotics: *** Hallucinogens: *** Seizures: *** DT: *** Detox: *** Residential: ***  Substance Abuse History in the last 12 months:  {yes no:314532}    Past Medical History:  Past Medical History:  Diagnosis Date   CKD (chronic kidney disease), stage II    Diabetes mellitus without complication (HCC)    type 1   NICM (nonischemic cardiomyopathy) (HCC)    Coronary Ca score 0 and no CAD on coronary CTGA 04/2021   Polysubstance abuse [facility])     Past  Surgical History:  Procedure Laterality Date   DENTAL SURGERY     RIGHT HEART CATH N/A 05/13/2021   Procedure: RIGHT HEART CATH;  Surgeon: Laurey Morale, MD;  Location: [facility] INVASIVE CV LAB;  Service: Cardiovascular;  Laterality: N/A;    Family History:  Family History  Problem Relation Age of Onset   Arthritis Mother    Diabetes Mellitus II Father     Social History:   Social History   Socioeconomic History   Marital status: Married    Spouse name: Not on file   Number of children: Not on file   Years of education: Not on file   Highest education level: Not on file  Occupational History   Not on file  Tobacco Use   Smoking status: Every Day    Current packs/day: 0.50    Average packs/day: 0.5 packs/day for 20.0 years (10.0 ttl pk-yrs)    Types: Cigarettes   Smokeless tobacco: Never  Vaping Use   Vaping status: Never Used  Substance and Sexual Activity   Alcohol use: Yes    Alcohol/week: 40.0 standard drinks of alcohol    Types: 40 Cans of beer per week   Drug use: Yes    Frequency: 5.0 times per week    Types: Marijuana, Cocaine   Sexual activity: Not on file  Other Topics Concern   Not on file  Social History Narrative   Not on file   Social Drivers of Health   Financial Resource Strain: High Risk (09/26/2022)   Overall Financial Resource Strain (CARDIA)    Difficulty of Paying Living Expenses: Very hard  Food Insecurity: Food Insecurity Present (09/26/2022)   Hunger Vital Sign    Worried About Running Out of Food in the Last Year: Sometimes true  Ran Out of Food in the Last Year: Often true  Transportation Needs: Unmet Transportation Needs (10/25/2022)   PRAPARE - Administrator, Civil Service (Medical): Yes    Lack of Transportation (Non-Medical): Yes  Physical Activity: Not on file  Stress: Not on file  Social Connections: Not on file    Allergies:  Allergies  Allergen Reactions   Shellfish Allergy Anaphylaxis    Current  Medications: Current Outpatient Medications  Medication Sig Dispense Refill   acetaminophen (TYLENOL) 325 MG tablet Take 650 mg by mouth every 6 (six) hours as needed for moderate pain.     amoxicillin (AMOXIL) 500 MG capsule Take 1 capsule (500 mg total) by mouth 3 (three) times daily until gone. 21 capsule 0   aspirin EC 81 MG tablet Take 1 tablet (81 mg total) by mouth daily. Swallow whole. 30 tablet 5   Blood Glucose Monitoring Suppl (TRUE METRIX METER) w/Device KIT Use kit to check blood glucose up to four times daily as directed 1 kit 0   carvedilol (COREG) 6.25 MG tablet Take 1 tablet (6.25 mg total) by mouth 2 (two) times daily. NEEDS FOLLOW UP APPOINTMENT FOR MORE REFILLS 60 tablet 0   fluticasone (FLONASE) 50 MCG/ACT nasal spray Place 2 sprays into both nostrils daily.     glucose blood (TRUE METRIX BLOOD GLUCOSE TEST) test strip Use as instructed 100 each 12   HYDROcodone-acetaminophen (NORCO/VICODIN) 5-325 MG tablet Take 1 tablet by mouth every 6 (six) hours as needed for pain. 12 tablet 0   ibuprofen (ADVIL) 600 MG tablet Take 1 tablet (600 mg total) by mouth every 6 (six) hours as needed for pain. 30 tablet 0   Insulin Glargine (BASAGLAR KWIKPEN) 100 UNIT/ML Inject 30 Units into the skin daily. 15 mL 3   insulin lispro (HUMALOG KWIKPEN) 100 UNIT/ML KwikPen Inject 10 Units into the skin daily before meals 9 mL 3   Insulin Pen Needle (TRUEPLUS 5-BEVEL PEN NEEDLES) 31G X 8 MM MISC Use as directed 3 (three) times daily before meals. 100 each 11   Insulin Syringe-Needle U-100 (TRUEPLUS INSULIN SYRINGE) 31G X 5/16" 0.3 ML MISC [place] as directed as needed. 100 each 0   Ipratropium-Albuterol (COMBIVENT) 20-100 MCG/ACT AERS respimat Inhale 1 puff by mouth into the lungs every 6 hours as needed for wheezing. 4 g 0   losartan (COZAAR) 25 MG tablet Take 1 tablet (25 mg total) by mouth daily. 90 tablet 1   sertraline (ZOLOFT) 50 MG tablet Take 1.5 tablets (75 mg total) by mouth daily. 45 tablet 0    TRUEplus Lancets 28G MISC Use as directed up to 4 times daily. 100 each 0   Vitamin D, Ergocalciferol, (DRISDOL) 1.25 MG (50000 UNIT) CAPS capsule Take 1 capsule (50,000 Units total) by mouth once a week. 6 capsule 0   No current facility-administered medications for this visit.        OBJECTIVE  There were no vitals taken for this visit.  Psychiatric Specialty Exam: General Appearance: Casual, faily groomed, not guarded ***  Eye Contact:  Good  Speech:  Clear, coherent, normal rate, non-pressured  Volume:  Normal  Mood:  "***"  Affect:  Appropriate, congruent, full range   Thought Content: Logical, rumination. No command or non-command AVH, paranoid delusions, first rank sxs.   Suicidal Thoughts:  Denied active and passive SI  Homicidal Thoughts:  Denied active and passive HI  Thought Process:  Coherent, goal-directed, mostly linear, circumstantial at times ***  Orientation:  A&Ox4  Memory:  Immediate good  Judgement:  Fair  Insight:  Fair, shallow  Concentration:  Attention and concentration good ***  Recall:  Fair  Fund of Knowledge:  Fair  Language:  Good, no aphasia  Psychomotor Activity:  Normal  Akathisia:  NA, no antipsychotics ***  AIMS (if indicated):  NA, no antipsychotics   ***  Assets:  {Assets (PAA):22698}  ADL's:  Intact  Cognition:  WNL  Sleep:  ***     Wt Readings from Last 3 Encounters:  05/31/23 132 lb 9.6 oz (60.1 kg)  12/27/22 134 lb 12.8 oz (61.1 kg)  10/26/22 133 lb 9.6 oz (60.6 kg)   Temp Readings from Last 3 Encounters:  01/20/23 98.5 F (36.9 C) (Oral)  11/29/21 98 F (36.7 C) (Oral)  09/07/21 98.2 F (36.8 C) (Oral)   BP Readings from Last 3 Encounters:  05/31/23 132/76  01/20/23 (!) 130/102  12/27/22 (!) 148/97   Pulse Readings from Last 3 Encounters:  05/31/23 94  01/20/23 89  12/27/22 91     Physical Exam  Strength & Muscle Tone: {desc; muscle tone:32375} Gait & Station: {PE GAIT ED NWGN:56213}  Screenings:  GAD-7     Flowsheet Row Office Visit from 05/31/2023 in [facility] Office Visit from 12/27/2022 in [facility] Office Visit from 09/26/2022 in [facility] Office Visit from 03/01/2022 in [facility] Office Visit from 09/07/2021 in [facility]  Total GAD-7 Score 17 21 21 17 20       PHQ2-9    Flowsheet Row Office Visit from 05/31/2023 in [facility] Office Visit from 12/27/2022 in [facility] Office Visit from 09/26/2022 in [facility] Office Visit from 03/01/2022 in [facility] Office Visit from 09/07/2021 in [facility]  PHQ-2 Total Score 4 5 6 4 6   PHQ-9 Total Score 19 24 27 19 22       Flowsheet Row ED from 01/20/2023 in [facility] Office Visit from 09/07/2021 in [facility] Counselor from 06/20/2021 in [facility]  C-SSRS RISK CATEGORY No Risk Error: Question 1 not populated Moderate Risk       Collaboration of Care: Case discussed with current outpatient attending, see attending's attestation for additional information   Signed: Augusta Blizzard, MD

## 2023-08-01 ENCOUNTER — Ambulatory Visit (HOSPITAL_COMMUNITY): Payer: Self-pay | Admitting: Student

## 2023-08-16 ENCOUNTER — Other Ambulatory Visit (INDEPENDENT_AMBULATORY_CARE_PROVIDER_SITE_OTHER): Payer: Self-pay | Admitting: Primary Care

## 2023-08-16 ENCOUNTER — Other Ambulatory Visit: Payer: Self-pay

## 2023-08-16 MED ORDER — TRUEPLUS 5-BEVEL PEN NEEDLES 31G X 8 MM MISC
1.0000 | Freq: Three times a day (TID) | 11 refills | Status: AC
  Filled 2023-08-16 – 2023-09-19 (×3): qty 100, 33d supply, fill #0
  Filled 2023-10-29 – 2024-01-18 (×3): qty 100, 33d supply, fill #1
  Filled 2024-03-18: qty 100, 33d supply, fill #2
  Filled 2024-04-15: qty 100, 33d supply, fill #3

## 2023-08-27 ENCOUNTER — Other Ambulatory Visit: Payer: Self-pay

## 2023-08-28 ENCOUNTER — Other Ambulatory Visit: Payer: Self-pay

## 2023-09-04 ENCOUNTER — Other Ambulatory Visit: Payer: Self-pay

## 2023-09-05 ENCOUNTER — Other Ambulatory Visit: Payer: Self-pay

## 2023-09-17 ENCOUNTER — Other Ambulatory Visit: Payer: Self-pay

## 2023-09-18 ENCOUNTER — Other Ambulatory Visit: Payer: Self-pay

## 2023-09-19 ENCOUNTER — Other Ambulatory Visit: Payer: Self-pay

## 2023-09-19 ENCOUNTER — Other Ambulatory Visit (INDEPENDENT_AMBULATORY_CARE_PROVIDER_SITE_OTHER): Payer: Self-pay | Admitting: Primary Care

## 2023-09-19 MED ORDER — TRUE METRIX BLOOD GLUCOSE TEST VI STRP
ORAL_STRIP | 12 refills | Status: AC
  Filled 2023-09-19: qty 100, 30d supply, fill #0
  Filled 2023-10-29 – 2024-01-18 (×4): qty 100, 30d supply, fill #1
  Filled 2024-03-18: qty 100, 30d supply, fill #2
  Filled 2024-04-15: qty 100, 30d supply, fill #3

## 2023-09-20 ENCOUNTER — Other Ambulatory Visit: Payer: Self-pay

## 2023-09-27 ENCOUNTER — Other Ambulatory Visit: Payer: Self-pay

## 2023-10-29 ENCOUNTER — Other Ambulatory Visit: Payer: Self-pay

## 2023-10-30 ENCOUNTER — Other Ambulatory Visit: Payer: Self-pay

## 2023-11-07 ENCOUNTER — Other Ambulatory Visit: Payer: Self-pay

## 2023-11-27 ENCOUNTER — Other Ambulatory Visit: Payer: Self-pay

## 2023-12-04 ENCOUNTER — Ambulatory Visit (INDEPENDENT_AMBULATORY_CARE_PROVIDER_SITE_OTHER): Admitting: Primary Care

## 2023-12-06 ENCOUNTER — Other Ambulatory Visit: Payer: Self-pay

## 2023-12-06 ENCOUNTER — Ambulatory Visit (INDEPENDENT_AMBULATORY_CARE_PROVIDER_SITE_OTHER): Payer: Self-pay | Admitting: Primary Care

## 2023-12-06 ENCOUNTER — Encounter (INDEPENDENT_AMBULATORY_CARE_PROVIDER_SITE_OTHER): Payer: Self-pay | Admitting: Primary Care

## 2023-12-06 VITALS — BP 132/83 | HR 86 | Temp 97.6°F | Resp 16 | Ht 68.0 in | Wt 134.0 lb

## 2023-12-06 DIAGNOSIS — E1069 Type 1 diabetes mellitus with other specified complication: Secondary | ICD-10-CM

## 2023-12-06 DIAGNOSIS — E10319 Type 1 diabetes mellitus with unspecified diabetic retinopathy without macular edema: Secondary | ICD-10-CM

## 2023-12-06 DIAGNOSIS — Z760 Encounter for issue of repeat prescription: Secondary | ICD-10-CM

## 2023-12-06 LAB — POCT GLYCOSYLATED HEMOGLOBIN (HGB A1C): HbA1c, POC (controlled diabetic range): 9.7 % — AB (ref 0.0–7.0)

## 2023-12-06 LAB — GLUCOSE, POCT (MANUAL RESULT ENTRY): POC Glucose: 252 mg/dL — AB (ref 70–99)

## 2023-12-06 MED ORDER — BASAGLAR KWIKPEN 100 UNIT/ML ~~LOC~~ SOPN
30.0000 [IU] | PEN_INJECTOR | Freq: Every day | SUBCUTANEOUS | 3 refills | Status: AC
  Filled 2023-12-06: qty 15, 50d supply, fill #0
  Filled 2023-12-21 – 2024-01-18 (×2): qty 9, 30d supply, fill #0
  Filled 2024-02-11: qty 9, 30d supply, fill #1
  Filled 2024-03-18: qty 9, 30d supply, fill #2
  Filled 2024-04-15: qty 9, 30d supply, fill #3

## 2023-12-06 MED ORDER — INSULIN LISPRO (1 UNIT DIAL) 100 UNIT/ML (KWIKPEN)
10.0000 [IU] | PEN_INJECTOR | Freq: Every day | SUBCUTANEOUS | 3 refills | Status: AC
  Filled 2023-12-06: qty 9, 30d supply, fill #0
  Filled 2023-12-21: qty 3, 28d supply, fill #0
  Filled 2024-01-18: qty 9, 30d supply, fill #0
  Filled 2024-03-18: qty 9, 30d supply, fill #1
  Filled 2024-04-15: qty 9, 30d supply, fill #2

## 2023-12-06 NOTE — Progress Notes (Signed)
 Subjective:  Patient ID: [name], male    DOB: Jan 25, 1976  Age: 48 y.o. MRN: [id number]  CC: Diabetes  9.7 Jahdiel Ricci presents forFollow-up of diabetes. Patient does check blood sugar at home Diabetes    Compliant with meds - Yes Checking CBGs? Yes  Fasting avg -   Postprandial average -  Exercising regularly? - Yes Watching carbohydrate intake? - No Neuropathy ? - Yes Hypoglycemic events - No  - Recovers with :   Pertinent ROS:  Polyuria - No Polydipsia - No Vision problems - Yes new DX of diabetic retinopathy   Medications as noted below. Taking them regularly without complication/adverse reaction being reported today.   History Garrie has a past medical history of CKD (chronic kidney disease), stage II, Diabetes mellitus without complication (HCC), NICM (nonischemic cardiomyopathy) (HCC), and Polysubstance abuse (HCC).   He has a past surgical history that includes Dental surgery and RIGHT HEART CATH (N/A, 05/13/2021).   His family history includes Arthritis in his mother; Diabetes Mellitus II in his father.He reports that he has been smoking cigarettes. He has a 10 pack-year smoking history. He has never used smokeless tobacco. He reports current alcohol use of about 40.0 standard drinks of alcohol per week. He reports current drug use. Frequency: 5.00 times per week. Drugs: Marijuana and Cocaine.  Current Outpatient Medications on File Prior to Visit  Medication Sig Dispense Refill   carvedilol  (COREG ) 6.25 MG tablet Take 1 tablet (6.25 mg total) by mouth 2 (two) times daily. NEEDS FOLLOW UP APPOINTMENT FOR MORE REFILLS 60 tablet 0   Insulin  Pen Needle (TRUEPLUS 5-BEVEL PEN NEEDLES) 31G X 8 MM MISC Use as directed 3 (three) times daily before meals. 100 each 11   sertraline  (ZOLOFT ) 50 MG tablet Take 1.5 tablets (75 mg total) by mouth daily. 45 tablet 0   TRUEplus Lancets 28G MISC Use as directed up to 4 times daily. 100 each 0   acetaminophen  (TYLENOL )  325 MG tablet Take 650 mg by mouth every 6 (six) hours as needed for moderate pain.     amoxicillin  (AMOXIL ) 500 MG capsule Take 1 capsule (500 mg total) by mouth 3 (three) times daily until gone. 21 capsule 0   aspirin  EC 81 MG tablet Take 1 tablet (81 mg total) by mouth daily. Swallow whole. 30 tablet 5   Blood Glucose Monitoring Suppl (TRUE METRIX METER) w/Device KIT Use kit to check blood glucose up to four times daily as directed 1 kit 0   fluticasone  (FLONASE ) 50 MCG/ACT nasal spray Place 2 sprays into both nostrils daily.     glucose blood (TRUE METRIX BLOOD GLUCOSE TEST) test strip Use as instructed 100 each 12   HYDROcodone -acetaminophen  (NORCO/VICODIN) 5-325 MG tablet Take 1 tablet by mouth every 6 (six) hours as needed for pain. 12 tablet 0   ibuprofen  (ADVIL ) 600 MG tablet Take 1 tablet (600 mg total) by mouth every 6 (six) hours as needed for pain. 30 tablet 0   Insulin  Glargine (BASAGLAR  KWIKPEN) 100 UNIT/ML Inject 30 Units into the skin daily. (Patient not taking: Reported on 12/06/2023) 15 mL 3   insulin  lispro (HUMALOG  KWIKPEN) 100 UNIT/ML KwikPen Inject 10 Units into the skin daily before meals (Patient not taking: Reported on 12/06/2023) 9 mL 3   Insulin  Syringe-Needle U-100 (TRUEPLUS INSULIN  SYRINGE) 31G X 5/16 0.3 ML MISC Us  as directed as needed. 100 each 0   Ipratropium-Albuterol  (COMBIVENT ) 20-100 MCG/ACT AERS respimat Inhale 1 puff by mouth into the lungs  every 6 hours as needed for wheezing. (Patient not taking: Reported on 12/06/2023) 4 g 0   losartan  (COZAAR ) 25 MG tablet Take 1 tablet (25 mg total) by mouth daily. (Patient not taking: Reported on 12/06/2023) 90 tablet 1   Vitamin D , Ergocalciferol , (DRISDOL ) 1.25 MG (50000 UNIT) CAPS capsule Take 1 capsule (50,000 Units total) by mouth once a week. (Patient not taking: Reported on 12/06/2023) 6 capsule 0   No current facility-administered medications on file prior to visit.    Review of Systems Comprehensive ROS Pertinent  positive and negative noted in HPI   Objective:  BP 132/83 (BP Location: Left Arm, Patient Position: Sitting, Cuff Size: Normal)   Pulse 86   Temp 97.6 F (36.4 C) (Oral)   Resp 16   Ht 5' 8 (1.727 m)   Wt 134 lb (60.8 kg)   SpO2 100%   BMI 20.37 kg/m   BP Readings from Last 3 Encounters:  12/06/23 132/83  05/31/23 132/76  01/20/23 (!) 130/102    Wt Readings from Last 3 Encounters:  12/06/23 134 lb (60.8 kg)  05/31/23 132 lb 9.6 oz (60.1 kg)  12/27/22 134 lb 12.8 oz (61.1 kg)    Physical Exam Vitals reviewed.  HENT:     Head: Normocephalic.     Right Ear: Tympanic membrane and external ear normal.     Left Ear: Tympanic membrane and external ear normal.     Nose: Nose normal.  Eyes:     Extraocular Movements: Extraocular movements intact.     Pupils: Pupils are equal, round, and reactive to light.  Cardiovascular:     Rate and Rhythm: Normal rate and regular rhythm.  Pulmonary:     Effort: Pulmonary effort is normal.     Breath sounds: Normal breath sounds.  Abdominal:     General: Bowel sounds are normal. There is distension.     Palpations: Abdomen is soft.  Musculoskeletal:        General: Normal range of motion.     Cervical back: Normal range of motion.  Skin:    General: Skin is warm and dry.  Neurological:     Mental Status: He is oriented to person, place, and time.  Psychiatric:        Mood and Affect: Mood normal.        Behavior: Behavior normal.        Thought Content: Thought content normal.        Judgment: Judgment normal.    Lab Results  Component Value Date   HGBA1C 11.1 (A) 05/31/2023   HGBA1C 9.9 (A) 12/27/2022   HGBA1C 10.7 (A) 09/26/2022    Lab Results  Component Value Date   WBC 8.9 05/31/2023   HGB 13.4 05/31/2023   HCT 39.8 05/31/2023   PLT 302 05/31/2023   GLUCOSE 428 (H) 05/31/2023   CHOL 131 05/31/2023   TRIG 98 05/31/2023   HDL 52 05/31/2023   LDLCALC 61 05/31/2023   ALT 23 05/31/2023   AST 24 05/31/2023   NA  136 05/31/2023   K 4.7 05/31/2023   CL 97 05/31/2023   CREATININE 0.97 05/31/2023   BUN 16 05/31/2023   CO2 25 05/31/2023   TSH 3.879 10/26/2022   INR 1.0 12/04/2020   HGBA1C 11.1 (A) 05/31/2023   MICROALBUR 0.58 06/17/2009    Title   Diabetic Foot Exam - detailed    Semmes-Weinstein Monofilament Test + means has sensation and - means no sensation  Image components are not supported.   Image components are not supported. Image components are not supported.  [place] Comments      Assessment & Plan:  Samik was seen today for diabetes.  Diagnoses and all orders for this visit: Loras was seen today for diabetes.  Diagnoses and all orders for this visit:  Type 1 diabetes mellitus with other specified complication (HCC) -     HgB A1c -     Glucose (CBG) -     Insulin  Glargine (BASAGLAR  KWIKPEN) 100 UNIT/ML; Inject 30 Units into the skin daily. -     insulin  lispro (HUMALOG  KWIKPEN) 100 UNIT/ML KwikPen; Inject 10 Units into the skin daily before meals  Diabetic retinopathy associated with type 1 diabetes mellitus, macular edema presence unspecified, unspecified laterality, unspecified retinopathy severity (HCC)  Medication refill -     Insulin  Glargine (BASAGLAR  KWIKPEN) 100 UNIT/ML; Inject 30 Units into the skin daily. -     insulin  lispro (HUMALOG  KWIKPEN) 100 UNIT/ML KwikPen; Inject 10 Units into the skin daily before meals     Follow-up:  With financial counselor   The above assessment and management plan was discussed with the patient. The patient verbalized understanding of and has agreed to the management plan. Patient is aware to call the clinic if symptoms fail to improve or worsen. Patient is aware when to return to the clinic for a follow-up visit. Patient educated on when it is appropriate to go to the emergency department.   Rosaline Bohr, NP-C

## 2023-12-12 ENCOUNTER — Ambulatory Visit (INDEPENDENT_AMBULATORY_CARE_PROVIDER_SITE_OTHER)

## 2023-12-19 ENCOUNTER — Ambulatory Visit (INDEPENDENT_AMBULATORY_CARE_PROVIDER_SITE_OTHER)

## 2023-12-21 ENCOUNTER — Other Ambulatory Visit (HOSPITAL_COMMUNITY): Payer: Self-pay | Admitting: Family Medicine

## 2023-12-21 ENCOUNTER — Other Ambulatory Visit (HOSPITAL_COMMUNITY): Payer: Self-pay | Admitting: Cardiology

## 2023-12-21 ENCOUNTER — Other Ambulatory Visit (INDEPENDENT_AMBULATORY_CARE_PROVIDER_SITE_OTHER): Payer: Self-pay | Admitting: Primary Care

## 2023-12-21 DIAGNOSIS — I10 Essential (primary) hypertension: Secondary | ICD-10-CM

## 2023-12-22 ENCOUNTER — Other Ambulatory Visit: Payer: Self-pay

## 2023-12-23 ENCOUNTER — Other Ambulatory Visit: Payer: Self-pay

## 2023-12-23 MED ORDER — LOSARTAN POTASSIUM 25 MG PO TABS
25.0000 mg | ORAL_TABLET | Freq: Every day | ORAL | 1 refills | Status: AC
  Filled 2023-12-23 – 2024-01-18 (×2): qty 90, 90d supply, fill #0
  Filled 2024-04-15: qty 90, 90d supply, fill #1

## 2023-12-24 ENCOUNTER — Other Ambulatory Visit: Payer: Self-pay

## 2023-12-24 MED ORDER — CARVEDILOL 6.25 MG PO TABS
6.2500 mg | ORAL_TABLET | Freq: Two times a day (BID) | ORAL | 0 refills | Status: DC
  Filled 2023-12-24 – 2024-01-18 (×4): qty 30, 15d supply, fill #0

## 2023-12-25 NOTE — Telephone Encounter (Signed)
 Called patient no answer did not leave message name on voicemail message was not the patients name, sent Mychart message

## 2023-12-26 ENCOUNTER — Ambulatory Visit (INDEPENDENT_AMBULATORY_CARE_PROVIDER_SITE_OTHER)

## 2023-12-27 ENCOUNTER — Other Ambulatory Visit (INDEPENDENT_AMBULATORY_CARE_PROVIDER_SITE_OTHER): Payer: Self-pay | Admitting: Primary Care

## 2023-12-27 ENCOUNTER — Other Ambulatory Visit: Payer: Self-pay

## 2023-12-28 ENCOUNTER — Other Ambulatory Visit: Payer: Self-pay

## 2023-12-28 MED ORDER — VITAMIN D (ERGOCALCIFEROL) 1.25 MG (50000 UNIT) PO CAPS
50000.0000 [IU] | ORAL_CAPSULE | ORAL | 0 refills | Status: AC
  Filled 2023-12-28 – 2024-01-18 (×2): qty 6, 42d supply, fill #0

## 2023-12-28 NOTE — Telephone Encounter (Signed)
 Requested medication (s) are due for refill today: yes  Requested medication (s) are on the active medication list: yes  Last refill:  10/30/22 #6  Future visit scheduled: no  Notes to clinic:  med not delegated to Nt to reorder or refuse/overdue lab work / last prescribed by Laymon Shed PA at [facility]   Requested Prescriptions  Pending Prescriptions Disp Refills   Vitamin D , Ergocalciferol , (DRISDOL ) 1.25 MG (50000 UNIT) CAPS capsule 6 capsule 0    Sig: Take 1 capsule (50,000 Units total) by mouth once a week.     Endocrinology:  Vitamins - Vitamin D  Supplementation 2 Failed - 12/28/2023  7:33 AM      Failed - Manual Review: Route requests for 50,000 IU strength to the provider      Failed - Vitamin D  in normal range and within 360 days    Vit D, 25-Hydroxy  Date Value Ref Range Status  10/26/2022 6.95 (L) 30 - 100 ng/mL Final    Comment:    (NOTE) Vitamin D  deficiency has been defined by the Institute of Medicine  and an Endocrine Society practice guideline as a level of serum 25-OH  vitamin D  less than 20 ng/mL (1,2). The Endocrine Society went on to  further define vitamin D  insufficiency as a level between 21 and 29  ng/mL (2).  1. IOM (Institute of Medicine). 2010. Dietary reference intakes for  calcium and D. [place]: [employer]. 2. Holick MF, Binkley Coolidge, Bischoff-Ferrari HA, et al. Evaluation,  treatment, and prevention of vitamin D  deficiency: an Endocrine  Society clinical practice guideline, JCEM. 2011 Jul; 96(7): 1911-30.  Performed at [facility], [address] [place], [place] [address]          Passed - Ca in normal range and within 360 days    Calcium  Date Value Ref Range Status  05/31/2023 9.0 8.7 - 10.2 mg/dL Final   Calcium, Ion  Date Value Ref Range Status  01/20/2023 1.07 (L) 1.15 - 1.40 mmol/L Final         Passed - Valid encounter within last 12 months    Recent Outpatient Visits            3 weeks ago Type 1 diabetes mellitus with other specified complication (HCC)   [place] [facility] Celestia Rosaline SQUIBB, NP   7 months ago Type 1 diabetes mellitus with other specified complication [facility])   [place] [facility] Celestia Rosaline SQUIBB, NP   1 year ago Type 1 diabetes mellitus with other specified complication [facility])   [place] [facility] Celestia Rosaline SQUIBB, NP   1 year ago Type 1 diabetes mellitus with other specified complication [facility])   [place] [facility] Celestia Rosaline SQUIBB, NP   1 year ago Type 1 diabetes mellitus with other specified complication [facility])   [place] [facility] Celestia Rosaline SQUIBB, NP

## 2024-01-02 ENCOUNTER — Other Ambulatory Visit: Payer: Self-pay

## 2024-01-08 ENCOUNTER — Other Ambulatory Visit: Payer: Self-pay

## 2024-01-18 ENCOUNTER — Other Ambulatory Visit: Payer: Self-pay

## 2024-02-12 ENCOUNTER — Other Ambulatory Visit: Payer: Self-pay

## 2024-03-18 ENCOUNTER — Other Ambulatory Visit: Payer: Self-pay

## 2024-03-18 ENCOUNTER — Other Ambulatory Visit (HOSPITAL_COMMUNITY): Payer: Self-pay | Admitting: Family Medicine

## 2024-03-18 MED ORDER — CARVEDILOL 6.25 MG PO TABS
6.2500 mg | ORAL_TABLET | Freq: Two times a day (BID) | ORAL | 0 refills | Status: DC
  Filled 2024-03-18: qty 30, 15d supply, fill #0

## 2024-04-15 ENCOUNTER — Other Ambulatory Visit: Payer: Self-pay

## 2024-04-15 ENCOUNTER — Other Ambulatory Visit (HOSPITAL_COMMUNITY): Payer: Self-pay | Admitting: Family Medicine

## 2024-04-15 MED ORDER — CARVEDILOL 6.25 MG PO TABS
6.2500 mg | ORAL_TABLET | Freq: Two times a day (BID) | ORAL | 0 refills | Status: AC
  Filled 2024-04-15 (×2): qty 30, 15d supply, fill #0
